# Patient Record
Sex: Male | Born: 1965 | Race: Asian | Hispanic: No | Marital: Married | Smoking: Never smoker
Health system: Southern US, Community
[De-identification: ages and names within clinical notes are randomized; demographics above are authoritative.]

## PROBLEM LIST (undated history)

## (undated) DIAGNOSIS — A419 Sepsis, unspecified organism: Secondary | ICD-10-CM

## (undated) DIAGNOSIS — E119 Type 2 diabetes mellitus without complications: Secondary | ICD-10-CM

## (undated) DIAGNOSIS — R945 Abnormal results of liver function studies: Secondary | ICD-10-CM

## (undated) DIAGNOSIS — J90 Pleural effusion, not elsewhere classified: Secondary | ICD-10-CM

## (undated) DIAGNOSIS — K819 Cholecystitis, unspecified: Secondary | ICD-10-CM

## (undated) DIAGNOSIS — K859 Acute pancreatitis without necrosis or infection, unspecified: Secondary | ICD-10-CM

## (undated) DIAGNOSIS — I10 Essential (primary) hypertension: Secondary | ICD-10-CM

## (undated) DIAGNOSIS — E049 Nontoxic goiter, unspecified: Secondary | ICD-10-CM

## (undated) DIAGNOSIS — Z87442 Personal history of urinary calculi: Secondary | ICD-10-CM

## (undated) DIAGNOSIS — E7800 Pure hypercholesterolemia, unspecified: Secondary | ICD-10-CM

## (undated) DIAGNOSIS — R6521 Severe sepsis with septic shock: Secondary | ICD-10-CM

## (undated) DIAGNOSIS — R7989 Other specified abnormal findings of blood chemistry: Secondary | ICD-10-CM

## (undated) DIAGNOSIS — B961 Klebsiella pneumoniae [K. pneumoniae] as the cause of diseases classified elsewhere: Secondary | ICD-10-CM

## (undated) DIAGNOSIS — J189 Pneumonia, unspecified organism: Secondary | ICD-10-CM

## (undated) DIAGNOSIS — R7881 Bacteremia: Secondary | ICD-10-CM

## (undated) HISTORY — DX: Pleural effusion, not elsewhere classified: J90

## (undated) HISTORY — DX: Other specified abnormal findings of blood chemistry: R79.89

## (undated) HISTORY — DX: Abnormal results of liver function studies: R94.5

## (undated) HISTORY — DX: Klebsiella pneumoniae (k. pneumoniae) as the cause of diseases classified elsewhere: B96.1

## (undated) HISTORY — DX: Severe sepsis with septic shock: R65.21

## (undated) HISTORY — DX: Bacteremia: R78.81

## (undated) HISTORY — DX: Sepsis, unspecified organism: A41.9

---

## 2010-10-03 DIAGNOSIS — I10 Essential (primary) hypertension: Secondary | ICD-10-CM | POA: Insufficient documentation

## 2010-10-03 DIAGNOSIS — E119 Type 2 diabetes mellitus without complications: Secondary | ICD-10-CM | POA: Insufficient documentation

## 2013-04-24 DIAGNOSIS — K819 Cholecystitis, unspecified: Secondary | ICD-10-CM

## 2013-04-24 HISTORY — DX: Cholecystitis, unspecified: K81.9

## 2013-05-09 ENCOUNTER — Inpatient Hospital Stay (HOSPITAL_COMMUNITY)
Admission: EM | Admit: 2013-05-09 | Discharge: 2013-05-25 | DRG: 853 | Disposition: A | Discharge status: Home / Self Care | Payer: BC Managed Care – PPO | Attending: General Surgery | Admitting: General Surgery

## 2013-05-09 ENCOUNTER — Emergency Department (HOSPITAL_COMMUNITY): Payer: BC Managed Care – PPO

## 2013-05-09 ENCOUNTER — Encounter (HOSPITAL_COMMUNITY): Payer: Self-pay | Admitting: Emergency Medicine

## 2013-05-09 DIAGNOSIS — E119 Type 2 diabetes mellitus without complications: Secondary | ICD-10-CM | POA: Diagnosis present

## 2013-05-09 DIAGNOSIS — E43 Unspecified severe protein-calorie malnutrition: Secondary | ICD-10-CM | POA: Diagnosis present

## 2013-05-09 DIAGNOSIS — A419 Sepsis, unspecified organism: Principal | ICD-10-CM | POA: Diagnosis present

## 2013-05-09 DIAGNOSIS — K7689 Other specified diseases of liver: Secondary | ICD-10-CM | POA: Diagnosis present

## 2013-05-09 DIAGNOSIS — N179 Acute kidney failure, unspecified: Secondary | ICD-10-CM | POA: Diagnosis present

## 2013-05-09 DIAGNOSIS — K219 Gastro-esophageal reflux disease without esophagitis: Secondary | ICD-10-CM | POA: Diagnosis present

## 2013-05-09 DIAGNOSIS — I498 Other specified cardiac arrhythmias: Secondary | ICD-10-CM | POA: Diagnosis present

## 2013-05-09 DIAGNOSIS — J9600 Acute respiratory failure, unspecified whether with hypoxia or hypercapnia: Secondary | ICD-10-CM | POA: Diagnosis not present

## 2013-05-09 DIAGNOSIS — R7989 Other specified abnormal findings of blood chemistry: Secondary | ICD-10-CM

## 2013-05-09 DIAGNOSIS — D696 Thrombocytopenia, unspecified: Secondary | ICD-10-CM

## 2013-05-09 DIAGNOSIS — K8070 Calculus of gallbladder and bile duct without cholecystitis without obstruction: Secondary | ICD-10-CM | POA: Diagnosis present

## 2013-05-09 DIAGNOSIS — R000 Tachycardia, unspecified: Secondary | ICD-10-CM

## 2013-05-09 DIAGNOSIS — E876 Hypokalemia: Secondary | ICD-10-CM | POA: Diagnosis not present

## 2013-05-09 DIAGNOSIS — I10 Essential (primary) hypertension: Secondary | ICD-10-CM | POA: Diagnosis present

## 2013-05-09 DIAGNOSIS — R066 Hiccough: Secondary | ICD-10-CM | POA: Diagnosis present

## 2013-05-09 DIAGNOSIS — E870 Hyperosmolality and hypernatremia: Secondary | ICD-10-CM | POA: Diagnosis not present

## 2013-05-09 DIAGNOSIS — K859 Acute pancreatitis without necrosis or infection, unspecified: Secondary | ICD-10-CM

## 2013-05-09 DIAGNOSIS — Y849 Medical procedure, unspecified as the cause of abnormal reaction of the patient, or of later complication, without mention of misadventure at the time of the procedure: Secondary | ICD-10-CM | POA: Diagnosis not present

## 2013-05-09 DIAGNOSIS — E875 Hyperkalemia: Secondary | ICD-10-CM | POA: Diagnosis present

## 2013-05-09 DIAGNOSIS — K560 Paralytic ileus: Secondary | ICD-10-CM | POA: Diagnosis not present

## 2013-05-09 DIAGNOSIS — K8060 Calculus of gallbladder and bile duct with cholecystitis, unspecified, without obstruction: Secondary | ICD-10-CM | POA: Diagnosis present

## 2013-05-09 DIAGNOSIS — E785 Hyperlipidemia, unspecified: Secondary | ICD-10-CM | POA: Diagnosis present

## 2013-05-09 DIAGNOSIS — K8050 Calculus of bile duct without cholangitis or cholecystitis without obstruction: Secondary | ICD-10-CM

## 2013-05-09 DIAGNOSIS — Q619 Cystic kidney disease, unspecified: Secondary | ICD-10-CM

## 2013-05-09 DIAGNOSIS — N281 Cyst of kidney, acquired: Secondary | ICD-10-CM

## 2013-05-09 DIAGNOSIS — E049 Nontoxic goiter, unspecified: Secondary | ICD-10-CM | POA: Diagnosis present

## 2013-05-09 DIAGNOSIS — R109 Unspecified abdominal pain: Secondary | ICD-10-CM

## 2013-05-09 DIAGNOSIS — K929 Disease of digestive system, unspecified: Secondary | ICD-10-CM | POA: Diagnosis not present

## 2013-05-09 DIAGNOSIS — K8064 Calculus of gallbladder and bile duct with chronic cholecystitis without obstruction: Secondary | ICD-10-CM | POA: Diagnosis present

## 2013-05-09 DIAGNOSIS — D72829 Elevated white blood cell count, unspecified: Secondary | ICD-10-CM

## 2013-05-09 HISTORY — DX: Essential (primary) hypertension: I10

## 2013-05-09 HISTORY — DX: Cholecystitis, unspecified: K81.9

## 2013-05-09 HISTORY — DX: Type 2 diabetes mellitus without complications: E11.9

## 2013-05-09 HISTORY — DX: Acute pancreatitis without necrosis or infection, unspecified: K85.90

## 2013-05-09 HISTORY — DX: Nontoxic goiter, unspecified: E04.9

## 2013-05-09 LAB — URINALYSIS, ROUTINE W REFLEX MICROSCOPIC
Glucose, UA: NEGATIVE mg/dL
Ketones, ur: 15 mg/dL — AB
Nitrite: NEGATIVE
Protein, ur: NEGATIVE mg/dL
Urobilinogen, UA: 0.2 mg/dL (ref 0.0–1.0)

## 2013-05-09 LAB — GLUCOSE, CAPILLARY
Glucose-Capillary: 182 mg/dL — ABNORMAL HIGH (ref 70–99)
Glucose-Capillary: 201 mg/dL — ABNORMAL HIGH (ref 70–99)
Glucose-Capillary: 219 mg/dL — ABNORMAL HIGH (ref 70–99)

## 2013-05-09 LAB — CBC WITH DIFFERENTIAL/PLATELET
Basophils Absolute: 0 10*3/uL (ref 0.0–0.1)
Eosinophils Absolute: 0.3 10*3/uL (ref 0.0–0.7)
Eosinophils Relative: 1 % (ref 0–5)
HCT: 49.4 % (ref 39.0–52.0)
Lymphs Abs: 7.1 10*3/uL — ABNORMAL HIGH (ref 0.7–4.0)
MCH: 32.5 pg (ref 26.0–34.0)
MCV: 91.8 fL (ref 78.0–100.0)
Monocytes Absolute: 2 10*3/uL — ABNORMAL HIGH (ref 0.1–1.0)
Neutro Abs: 24.6 10*3/uL — ABNORMAL HIGH (ref 1.7–7.7)
Platelets: 239 10*3/uL (ref 150–400)
RBC: 5.38 MIL/uL (ref 4.22–5.81)
RDW: 13.2 % (ref 11.5–15.5)

## 2013-05-09 LAB — URINE MICROSCOPIC-ADD ON

## 2013-05-09 LAB — COMPREHENSIVE METABOLIC PANEL
AST: 159 U/L — ABNORMAL HIGH (ref 0–37)
BUN: 14 mg/dL (ref 6–23)
CO2: 24 mEq/L (ref 19–32)
Calcium: 9.6 mg/dL (ref 8.4–10.5)
Chloride: 96 mEq/L (ref 96–112)
Creatinine, Ser: 1.38 mg/dL — ABNORMAL HIGH (ref 0.50–1.35)
GFR calc non Af Amer: 60 mL/min — ABNORMAL LOW (ref >90)
Total Bilirubin: 6.6 mg/dL — ABNORMAL HIGH (ref 0.3–1.2)
Total Protein: 8 g/dL (ref 6.0–8.3)

## 2013-05-09 LAB — MAGNESIUM: Magnesium: 1.8 mg/dL (ref 1.5–2.5)

## 2013-05-09 LAB — LIPASE, BLOOD: Lipase: >3000 U/L — ABNORMAL HIGH (ref 11–59)

## 2013-05-09 MED ORDER — SODIUM CHLORIDE 0.9 % IV SOLN
3.0000 g | Freq: Once | INTRAVENOUS | Status: AC
  Administered 2013-05-09: 3 g via INTRAVENOUS
  Filled 2013-05-09: qty 3

## 2013-05-09 MED ORDER — HYDROMORPHONE HCL PF 1 MG/ML IJ SOLN
INTRAMUSCULAR | Status: AC
  Administered 2013-05-09: 2 mg via INTRAVENOUS
  Filled 2013-05-09: qty 2

## 2013-05-09 MED ORDER — IOHEXOL 300 MG/ML  SOLN: 25.0000 mL | INTRAMUSCULAR | Status: DC

## 2013-05-09 MED ORDER — HYDROMORPHONE HCL PF 1 MG/ML IJ SOLN
1.0000 mg | Freq: Once | INTRAMUSCULAR | Status: AC
  Administered 2013-05-09: 1 mg via INTRAVENOUS
  Filled 2013-05-09: qty 1

## 2013-05-09 MED ORDER — ONDANSETRON HCL 4 MG/2ML IJ SOLN
4.0000 mg | Freq: Three times a day (TID) | INTRAMUSCULAR | Status: AC | PRN
  Administered 2013-05-09 – 2013-05-13 (×2): 4 mg via INTRAVENOUS
  Filled 2013-05-09: qty 2

## 2013-05-09 MED ORDER — HYDROMORPHONE HCL PF 1 MG/ML IJ SOLN
1.0000 mg | INTRAMUSCULAR | Status: DC | PRN
  Administered 2013-05-09 – 2013-05-10 (×7): 2 mg via INTRAVENOUS
  Filled 2013-05-09 (×6): qty 2

## 2013-05-09 MED ORDER — ONDANSETRON HCL 4 MG/2ML IJ SOLN
4.0000 mg | Freq: Once | INTRAMUSCULAR | Status: AC
  Administered 2013-05-09: 4 mg via INTRAVENOUS
  Filled 2013-05-09: qty 2

## 2013-05-09 MED ORDER — IOHEXOL 300 MG/ML  SOLN
80.0000 mL | Freq: Once | INTRAMUSCULAR | Status: AC | PRN
  Administered 2013-05-09: 80 mL via INTRAVENOUS

## 2013-05-09 MED ORDER — SODIUM CHLORIDE 0.9 % IV SOLN
3.0000 g | Freq: Four times a day (QID) | INTRAVENOUS | Status: DC
  Administered 2013-05-09 – 2013-05-13 (×15): 3 g via INTRAVENOUS
  Filled 2013-05-09 (×19): qty 3

## 2013-05-09 MED ORDER — ONDANSETRON HCL 4 MG/2ML IJ SOLN
INTRAMUSCULAR | Status: AC
  Administered 2013-05-09: 4 mg via INTRAVENOUS
  Filled 2013-05-09: qty 2

## 2013-05-09 MED ORDER — INSULIN ASPART 100 UNIT/ML ~~LOC~~ SOLN
0.0000 [IU] | SUBCUTANEOUS | Status: DC
Start: 2013-05-09 — End: 2013-05-20
  Administered 2013-05-09 (×2): 5 [IU] via SUBCUTANEOUS
  Administered 2013-05-10: 2 [IU] via SUBCUTANEOUS
  Administered 2013-05-10 (×2): 5 [IU] via SUBCUTANEOUS
  Administered 2013-05-11 – 2013-05-12 (×4): 2 [IU] via SUBCUTANEOUS
  Administered 2013-05-12: 5 [IU] via SUBCUTANEOUS
  Administered 2013-05-13: 3 [IU] via SUBCUTANEOUS
  Administered 2013-05-13: 2 [IU] via SUBCUTANEOUS
  Administered 2013-05-14: 1 [IU] via SUBCUTANEOUS
  Administered 2013-05-14: 3 [IU] via SUBCUTANEOUS
  Administered 2013-05-14: 2 [IU] via SUBCUTANEOUS
  Administered 2013-05-14: 3 [IU] via SUBCUTANEOUS
  Administered 2013-05-14: 1 [IU] via SUBCUTANEOUS
  Administered 2013-05-14 – 2013-05-15 (×2): 2 [IU] via SUBCUTANEOUS
  Administered 2013-05-15: 1 [IU] via SUBCUTANEOUS
  Administered 2013-05-15: 2 [IU] via SUBCUTANEOUS
  Administered 2013-05-16 (×4): 3 [IU] via SUBCUTANEOUS
  Administered 2013-05-16 (×2): 5 [IU] via SUBCUTANEOUS
  Administered 2013-05-17: 3 [IU] via SUBCUTANEOUS
  Administered 2013-05-17: 11 [IU] via SUBCUTANEOUS
  Administered 2013-05-17 – 2013-05-18 (×3): 5 [IU] via SUBCUTANEOUS
  Administered 2013-05-18: 8 [IU] via SUBCUTANEOUS
  Administered 2013-05-18: 5 [IU] via SUBCUTANEOUS
  Administered 2013-05-18: 8 [IU] via SUBCUTANEOUS
  Administered 2013-05-18 (×2): 5 [IU] via SUBCUTANEOUS
  Administered 2013-05-19: 11 [IU] via SUBCUTANEOUS
  Administered 2013-05-19 (×3): 5 [IU] via SUBCUTANEOUS
  Administered 2013-05-19 (×2): 8 [IU] via SUBCUTANEOUS
  Administered 2013-05-20: 3 [IU] via SUBCUTANEOUS
  Administered 2013-05-20 (×2): 5 [IU] via SUBCUTANEOUS

## 2013-05-09 MED ORDER — LORAZEPAM 2 MG/ML IJ SOLN
1.0000 mg | Freq: Once | INTRAMUSCULAR | Status: AC
  Administered 2013-05-09: 1 mg via INTRAVENOUS
  Filled 2013-05-09: qty 1

## 2013-05-09 MED ORDER — POTASSIUM CHLORIDE IN NACL 40-0.9 MEQ/L-% IV SOLN
INTRAVENOUS | Status: DC
  Administered 2013-05-09: 18:00:00 via INTRAVENOUS
  Administered 2013-05-10: 125 mL/h via INTRAVENOUS
  Administered 2013-05-10: 04:00:00 via INTRAVENOUS
  Filled 2013-05-09 (×8): qty 1000

## 2013-05-09 MED ORDER — SODIUM CHLORIDE 0.9 % IV BOLUS (SEPSIS)
1000.0000 mL | Freq: Once | INTRAVENOUS | Status: AC
  Administered 2013-05-09: 1000 mL via INTRAVENOUS

## 2013-05-09 NOTE — ED Provider Notes (Signed)
Pt reports he started having epigastric abdominal pain that does not radiate about 3 weeks ago. He has had decreased appetite but denies nausea, vomiting, fever or chills. He states his urine has started getting darker.   Pt has scleral icterus and mild yellow tint to his skin. He appears to be uncomfortable.   Medical screening examination/treatment/procedure(s) were conducted as a shared visit with non-physician practitioner(s) and myself.  I personally evaluated the patient during the encounter.  EKG Interpretation   None         Devoria Albe, MD, Armando Gang   Ward Givens, MD 05/09/13 [phone]

## 2013-05-09 NOTE — ED Notes (Signed)
Pt in CT.

## 2013-05-09 NOTE — ED Notes (Signed)
Arrives Via [facility] for abdominal pain x1 month, this morning pain got worse while driving. Pt describes pain as stabbing doesn't radiate, upon arrival pt was pale, diaphoretic, given 4mg  of zofran en route, 50 mcg of fentanyl x2 en route brought pain from 10/10 to 9/10. EKG NSR with hr of 62, CBG 220 with hx of diabetes.

## 2013-05-09 NOTE — Consult Note (Signed)
Reason for Consult: Choledocholithiasis, Cholelithiasis, and Pancreatitis Referring Physician: [facility]  Nicolas Chandler HPI: This is a 47 year old male with a PMH of DM and HTN admitted for choledocholithiasis, cholelithiasis, and pancreatitis.  His symptoms started 3 weeks ago and he thought it was as a result of GERD, but it was unresponsive to Zantac.  He subsequently had intermittent symptoms over the intervening time period, but today he started to have unremitting pain.  The patient presented to the ER and he was identified to have the above findings.  No prior issues with his gallbladder until this time.  As a result of the findings a GI consultation was requested.  Past Medical History  Diagnosis Date  . Diabetes mellitus without complication   . Hypertension     History reviewed. No pertinent past surgical history.  History reviewed. No pertinent family history.  Social History:  reports that he has never smoked. He does not have any smokeless tobacco history on file. He reports that he does not drink alcohol. His drug history is not on file.  Allergies: No Known Allergies  Medications:  Scheduled:  Continuous: . ampicillin-sulbactam (UNASYN) IV 3 g (05/09/13 1621)    Results for orders placed during the hospital encounter of 05/09/13 (from the past 24 hour(s))  CBC WITH DIFFERENTIAL     Status: Abnormal   Collection Time    05/09/13 12:32 PM      Result Value Range   WBC 34.0 (*) 4.0 - 10.5 K/uL   RBC 5.38  4.22 - 5.81 MIL/uL   Hemoglobin 17.5 (*) 13.0 - 17.0 g/dL   HCT 49.4  39.0 - 52.0 %   MCV 91.8  78.0 - 100.0 fL   MCH 32.5  26.0 - 34.0 pg   MCHC 35.4  30.0 - 36.0 g/dL   RDW 13.2  11.5 - 15.5 %   Platelets 239  150 - 400 K/uL   Neutrophils Relative % 72  43 - 77 %   Lymphocytes Relative 21  12 - 46 %   Monocytes Relative 6  3 - 12 %   Eosinophils Relative 1  0 - 5 %   Basophils Relative 0  0 - 1 %   Neutro Abs 24.6 (*) 1.7 - 7.7 K/uL   Lymphs Abs 7.1  (*) 0.7 - 4.0 K/uL   Monocytes Absolute 2.0 (*) 0.1 - 1.0 K/uL   Eosinophils Absolute 0.3  0.0 - 0.7 K/uL   Basophils Absolute 0.0  0.0 - 0.1 K/uL   WBC Morphology WHITE COUNT CONFIRMED ON SMEAR    COMPREHENSIVE METABOLIC PANEL     Status: Abnormal   Collection Time    05/09/13 12:32 PM      Result Value Range   Sodium 136  135 - 145 mEq/L   Potassium 2.9 (*) 3.5 - 5.1 mEq/L   Chloride 96  96 - 112 mEq/L   CO2 24  19 - 32 mEq/L   Glucose, Bld 201 (*) 70 - 99 mg/dL   BUN 14  6 - 23 mg/dL   Creatinine, Ser 1.38 (*) 0.50 - 1.35 mg/dL   Calcium 9.6  8.4 - 10.5 mg/dL   Total Protein 8.0  6.0 - 8.3 g/dL   Albumin 4.6  3.5 - 5.2 g/dL   AST 159 (*) 0 - 37 U/L   ALT 464 (*) 0 - 53 U/L   Alkaline Phosphatase 239 (*) 39 - 117 U/L   Total Bilirubin 6.6 (*) 0.3 -   1.2 mg/dL   GFR calc non Af Amer 60 (*) >90 mL/min   GFR calc Af Amer 70 (*) >90 mL/min  LIPASE, BLOOD     Status: Abnormal   Collection Time    05/09/13 12:32 PM      Result Value Range   Lipase >3000 (*) 11 - 59 U/L  GLUCOSE, CAPILLARY     Status: Abnormal   Collection Time    05/09/13 12:44 PM      Result Value Range   Glucose-Capillary 182 (*) 70 - 99 mg/dL   Comment 1 Documented in Chart    URINALYSIS, ROUTINE W REFLEX MICROSCOPIC     Status: Abnormal   Collection Time    05/09/13  1:47 PM      Result Value Range   Color, Urine ORANGE (*) YELLOW   APPearance CLEAR  CLEAR   Specific Gravity, Urine 1.016  1.005 - 1.030   pH 5.0  5.0 - 8.0   Glucose, UA NEGATIVE  NEGATIVE mg/dL   Hgb urine dipstick NEGATIVE  NEGATIVE   Bilirubin Urine LARGE (*) NEGATIVE   Ketones, ur 15 (*) NEGATIVE mg/dL   Protein, ur NEGATIVE  NEGATIVE mg/dL   Urobilinogen, UA 0.2  0.0 - 1.0 mg/dL   Nitrite NEGATIVE  NEGATIVE   Leukocytes, UA TRACE (*) NEGATIVE  URINE MICROSCOPIC-ADD ON     Status: Abnormal   Collection Time    05/09/13  1:47 PM      Result Value Range   Squamous Epithelial / LPF RARE  RARE   WBC, UA 0-2  <3 WBC/hpf   RBC /  HPF 0-2  <3 RBC/hpf   Bacteria, UA RARE  RARE   Casts HYALINE CASTS (*) NEGATIVE     Us Abdomen Complete  05/09/2013   CLINICAL DATA:  Right upper quadrant pain.  EXAM: ULTRASOUND ABDOMEN COMPLETE  COMPARISON:  None.  FINDINGS: Gallbladder:  There are multiple echogenic stones in the gallbladder. Gallbladder wall is mildly thickened, measuring 0.3 cm. There is hypoechoic material in the right upper quadrant suggestive for complex fluid. The complex fluid extends into the right flank region. There is simple appearing fluid around the gallbladder. Reportedly, the patient does not have a sonographic Murphy's sign but the patient has received pain medication.  Common bile duct:  Diameter: 0.5 cm  Liver:  Increased echogenicity of the liver suggests hepatic steatosis. The liver parenchyma is also heterogeneous with loss of the internal architecture.  IVC:  Not well seen.  Pancreas:  Not well seen.  Spleen:  Size and appearance within normal limits. Spleen measures 6.4 cm in length.  Right Kidney:  Length: 10.5 cm. Echogenicity within normal limits. No mass or hydronephrosis visualized.  Left Kidney:  Length: 10.8 cm. Echogenicity within normal limits. No mass or hydronephrosis visualized.  Abdominal aorta:  No aneurysm visualized.  Other findings:  None.  IMPRESSION: Cholelithiasis with mild gallbladder wall thickening. There is complex fluid in the right upper quadrant suggesting an acute inflammatory process.  Hepatic steatosis.   Electronically Signed   By: Adam  Henn M.D.   On: 05/09/2013 15:25   Ct Abdomen Pelvis W Contrast  05/09/2013   CLINICAL DATA:  Abdominal pain.  EXAM: CT ABDOMEN AND PELVIS WITH CONTRAST  TECHNIQUE: Multidetector CT imaging of the abdomen and pelvis was performed using the standard protocol following bolus administration of intravenous contrast.  CONTRAST:  80mL OMNIPAQUE IOHEXOL 300 MG/ML  SOLN  COMPARISON:  Ultrasound 05/09/2013  FINDINGS: Lung bases   are clear.  There is no  evidence for free air.  Decreased attenuation of the liver is consistent with hepatic steatosis. There is a small amount of fluid along the right hepatic dome. There is fluid around the inferior gallbladder and the duodenum. There are multiple gallstones. The gallbladder wall is dense and could represent hyperemia. Portal venous system is patent. There is marked inflammation involving the descending duodenum. There is wall thickening along the medial duodenum wall, measuring up to 1.6 cm. Multiple calcifications in the distal common bile duct are suggestive for choledocholithiasis. There is no significant common bile duct dilatation. The base of the gallbladder or the cystic duct is distended, measuring up to 1.5 cm, and containing multiple stones. There is fluid or edema in the porta hepatitis. There is fluid and edema tracking around the pancreas. Fluid extends into the left upper quadrant and adjacent to the stomach and spleen. No significant pancreatic duct dilatation. Normal appearance of the spleen.  There is a 1.7 cm low-density structure along the right kidney upper pole. The Hounsfield units roughly measure 39 and not consistent with a simple cyst. However, the Hounsfield units do not significantly change on the delayed images and probably represents a proteinaceous or hemorrhagic cyst. There is a small cyst along the medial right kidney. Question a punctate stone in the right kidney without hydronephrosis. There are two nonobstructive left kidney stones.  No gross abnormality to the prostate, seminal vesicles or urinary bladder. No significant lymphadenopathy. No acute inflammation involving the appendix. Normal appearance of the jejunum and ileum. No gross abnormality to the colon.  Bilateral pars defects at L5. No significant anterolisthesis at L5-S1.  IMPRESSION: There is extensive fluid and inflammation in the right upper quadrant, centered around the duodenum. There is marked asymmetric duodenal wall  thickening with stones in the distal common bile duct. Evidence for pancreatitis.  Multiple gallstones and cannot exclude cholecystitis.  Decreased attenuation of the liver suggests hepatic steatosis.  Nonobstructive kidney stones.  1.7 cm indeterminate right renal structure. This probably represents a hyperdense or proteinaceous renal cyst but indeterminate. Consider follow-up ultrasound after the acute process has resolved. This area was not well evaluated on today's ultrasound.  These results were called by telephone at the time of interpretation on 05/09/2013 at 3:43 PM to Dr. KAITLYN SZEKALSKI , who verbally acknowledged these results.   Electronically Signed   By: Adam  Henn M.D.   On: 05/09/2013 15:47    ROS:  As stated above in the HPI otherwise negative.  Blood pressure 132/74, pulse 74, temperature 98.2 F (36.8 C), temperature source Oral, resp. rate 31, SpO2 98.00%.    PE: Gen: NAD, Alert and Oriented, uncomfortable HEENT:  [place]/AT, EOMI Neck: Supple, no LAD Lungs: CTA Bilaterally CV: RRR without M/G/R ABM: Soft, minimal epigastric tenderness, +BS Ext: No C/C/E Skin: Jaundiced  Assessment/Plan: 1) Choledocholithiasis. 2) Cholelithiasis. 3) Gallstone pancreatitis.   The patient has choledocholithiasis, but there is no ductal dilation.  There is a significant amount of inflammation around the duodenum.  I cannot discern if this is from his pancreatitis alone or if there is a component of cholecystitis.  His WBC is elevated, but he does not appear to be septic.  His WBC elevation can be from his pancreatitis.  Plan: 1) ERCP tomorrow. 2) Pain control. 3) Surgical consultation. 4) NPO. 5) Continue with antibiotics.  Zariel Capano D 05/09/2013, 4:52 PM      

## 2013-05-09 NOTE — Progress Notes (Signed)
ANTIBIOTIC CONSULT NOTE - INITIAL  Pharmacy Consult for Unasyn Indication: Cholecystitis   No Known Allergies  Patient Measurements:   Adjusted Body Weight: n/a  Vital Signs: Temp: 98.2 F (36.8 C) (12/16 1234) Temp src: Oral (12/16 1234) BP: 132/74 mmHg (12/16 1600) Pulse Rate: 74 (12/16 1600) Intake/Output from previous day:   Intake/Output from this shift: Total I/O In: 1000 [I.V.:1000] Out: -   Labs:  Recent Labs  05/09/13 1232  WBC 34.0*  HGB 17.5*  PLT 239  CREATININE 1.38*   CrCl is unknown because there is no height on file for the current visit. No results found for this basename: VANCOTROUGH, VANCOPEAK, VANCORANDOM, GENTTROUGH, GENTPEAK, GENTRANDOM, TOBRATROUGH, TOBRAPEAK, TOBRARND, AMIKACINPEAK, AMIKACINTROU, AMIKACIN,  in the last 72 hours   Microbiology: No results found for this or any previous visit (from the past 720 hour(s)).  Medical History: Past Medical History  Diagnosis Date  . Diabetes mellitus without complication   . Hypertension     Medications:   (Not in a hospital admission) Assessment: 43 YOM who presents with abdominal pain for the past 3 weeks that acutely worsened this morning. His RUQ US shows gallstones and mild gallbladder thickening. CT abdomen pending. Starting unasyin for cholecystitis.   Goal of Therapy:  Eradication of infection  Urine Cx>>  Plan:  1) Unasyn 3 gm IV x 1 dose in ED 2) Unasyn maintenance dose of 3 gm IV Q 6 hours after patient admitted to bed 3) F/u CBC, cultures and patient clinical status   Vinnie Level, PharmD.  Clinical Pharmacist Pager [phone]

## 2013-05-09 NOTE — H&P (Addendum)
[facility] History and Physical  Nicolas Chandler WJX:[id number] DOB: Apr 20, 1966 DOA: 05/09/2013  Referring physician: er PCP: No PCP Per Patient - PCP in [place]  Chief Complaint: abd pain  HPI: Nicolas Chandler is a 47 y.o. male  With PMHx of DM, HTN, HLD but no heart issues who comes in with a 3 week history of abdominal pain.  Worsened yesterday With nausea and vomiting.  The pain is severe- never had before, nothing better, nothing worse. No fever  No chills No CP, no SOB  In the Er, he was found to have an elevated lipase, WBC and and was found to have choledocholithiasis- GI ([facility]) was called for an evaluation and hospitalist were called for admission   Review of Systems:  All systems reviewed, negative unless stated above   Past Medical History  Diagnosis Date  . Diabetes mellitus without complication   . Hypertension    History reviewed. No pertinent past surgical history. Social History:  reports that he has never smoked. He does not have any smokeless tobacco history on file. He reports that he does not drink alcohol. His drug history is not on file.  No Known Allergies  History reviewed. No pertinent family history.   Prior to Admission medications   Medication Sig Start Date End Date Taking? Authorizing Provider  glimepiride (AMARYL) 4 MG tablet Take 4 mg by mouth 2 (two) times daily.   Yes Historical Provider, MD  lisinopril (PRINIVIL,ZESTRIL) 5 MG tablet Take 5 mg by mouth daily.   Yes Historical Provider, MD  metFORMIN (GLUCOPHAGE) 500 MG tablet Take 500 mg by mouth 2 (two) times daily with a meal.   Yes Historical Provider, MD  OVER THE COUNTER MEDICATION Take 1 tablet by mouth once.   Yes Historical Provider, MD  simvastatin (ZOCOR) 20 MG tablet Take 20 mg by mouth daily.   Yes Historical Provider, MD   Physical Exam: Filed Vitals:   05/09/13 1600  BP: 132/74  Pulse: 74  Temp:   Resp:     BP 132/74  Pulse 74  Temp(Src) 98.2 F (36.8 C) (Oral)  Resp  31  Ht 5\' 6"  (1.676 m)  Wt 70 kg (154 lb 5.2 oz)  BMI 24.92 kg/m2  SpO2 98%  BP 132/74  Pulse 74  Temp(Src) 98.2 F (36.8 C) (Oral)  Resp 31  Ht 5\' 6"  (1.676 m)  Wt 70 kg (154 lb 5.2 oz)  BMI 24.92 kg/m2  SpO2 98%  General Appearance:    Uncomfortable, mild skin yellowing  Head:    Normocephalic, without obvious abnormality, atraumatic  Eyes:    PERRL, conjunctiva/corneas clear, EOM's intact, fundi    benign, both eyes       Ears:    Normal TM's and external ear canals, both ears  Nose:   Nares normal, septum midline, mucosa normal, no drainage   or sinus tenderness  Throat:   Dry mucous membranes  Neck:   Supple, symmetrical, trachea midline, no adenopathy;       thyroid:  No enlargement/tenderness/nodules; no carotid   bruit or JVD  Back:     Symmetric, no curvature, ROM normal, no CVA tenderness  Lungs:     Clear to auscultation bilaterally, respirations unlabored  Chest wall:    No tenderness or deformity  Heart:    Regular rate and rhythm, S1 and S2 normal, no murmur, rub   or gallop  Abdomen:     Soft, tender across the upper abd, bowel sounds active  all four quadrants    Extremities:   Extremities normal, atraumatic, no cyanosis or edema  Pulses:   2+ and symmetric all extremities  Skin:   Skin color, texture, turgor normal, no rashes or lesions  Lymph nodes:   Cervical, supraclavicular, and axillary nodes normal  Neurologic:   CNII-XII intact. Normal strength, sensation and reflexes      throughout             Labs on Admission:  Basic Metabolic Panel:  Recent Labs Lab 05/09/13 1232  NA 136  K 2.9*  CL 96  CO2 24  GLUCOSE 201*  BUN 14  CREATININE 1.38*  CALCIUM 9.6   Liver Function Tests:  Recent Labs Lab 05/09/13 1232  AST 159*  ALT 464*  ALKPHOS 239*  BILITOT 6.6*  PROT 8.0  ALBUMIN 4.6    Recent Labs Lab 05/09/13 1232  LIPASE >3000*   No results found for this basename: AMMONIA,  in the last 168 hours CBC:  Recent Labs Lab  05/09/13 1232  WBC 34.0*  NEUTROABS 24.6*  HGB 17.5*  HCT 49.4  MCV 91.8  PLT 239   Cardiac Enzymes: No results found for this basename: CKTOTAL, CKMB, CKMBINDEX, TROPONINI,  in the last 168 hours  BNP (last 3 results) No results found for this basename: PROBNP,  in the last 8760 hours CBG:  Recent Labs Lab 05/09/13 1244  GLUCAP 182*    Radiological Exams on Admission: US Abdomen Complete  05/09/2013   CLINICAL DATA:  Right upper quadrant pain.  EXAM: ULTRASOUND ABDOMEN COMPLETE  COMPARISON:  None.  FINDINGS: Gallbladder:  There are multiple echogenic stones in the gallbladder. Gallbladder wall is mildly thickened, measuring 0.3 cm. There is hypoechoic material in the right upper quadrant suggestive for complex fluid. The complex fluid extends into the right flank region. There is simple appearing fluid around the gallbladder. Reportedly, the patient does not have a sonographic Murphy's sign but the patient has received pain medication.  Common bile duct:  Diameter: 0.5 cm  Liver:  Increased echogenicity of the liver suggests hepatic steatosis. The liver parenchyma is also heterogeneous with loss of the internal architecture.  IVC:  Not well seen.  Pancreas:  Not well seen.  Spleen:  Size and appearance within normal limits. Spleen measures 6.4 cm in length.  Right Kidney:  Length: 10.5 cm. Echogenicity within normal limits. No mass or hydronephrosis visualized.  Left Kidney:  Length: 10.8 cm. Echogenicity within normal limits. No mass or hydronephrosis visualized.  Abdominal aorta:  No aneurysm visualized.  Other findings:  None.  IMPRESSION: Cholelithiasis with mild gallbladder wall thickening. There is complex fluid in the right upper quadrant suggesting an acute inflammatory process.  Hepatic steatosis.   Electronically Signed   By: Richarda Overlie M.D.   On: 05/09/2013 15:25   Ct Abdomen Pelvis W Contrast  05/09/2013   CLINICAL DATA:  Abdominal pain.  EXAM: CT ABDOMEN AND PELVIS WITH  CONTRAST  TECHNIQUE: Multidetector CT imaging of the abdomen and pelvis was performed using the standard protocol following bolus administration of intravenous contrast.  CONTRAST:  80mL OMNIPAQUE IOHEXOL 300 MG/ML  SOLN  COMPARISON:  Ultrasound 05/09/2013  FINDINGS: Lung bases are clear.  There is no evidence for free air.  Decreased attenuation of the liver is consistent with hepatic steatosis. There is a small amount of fluid along the right hepatic dome. There is fluid around the inferior gallbladder and the duodenum. There are multiple gallstones. The gallbladder wall  is dense and could represent hyperemia. Portal venous system is patent. There is marked inflammation involving the descending duodenum. There is wall thickening along the medial duodenum wall, measuring up to 1.6 cm. Multiple calcifications in the distal common bile duct are suggestive for choledocholithiasis. There is no significant common bile duct dilatation. The base of the gallbladder or the cystic duct is distended, measuring up to 1.5 cm, and containing multiple stones. There is fluid or edema in the porta hepatitis. There is fluid and edema tracking around the pancreas. Fluid extends into the left upper quadrant and adjacent to the stomach and spleen. No significant pancreatic duct dilatation. Normal appearance of the spleen.  There is a 1.7 cm low-density structure along the right kidney upper pole. The Hounsfield units roughly measure 39 and not consistent with a simple cyst. However, the Hounsfield units do not significantly change on the delayed images and probably represents a proteinaceous or hemorrhagic cyst. There is a small cyst along the medial right kidney. Question a punctate stone in the right kidney without hydronephrosis. There are two nonobstructive left kidney stones.  No gross abnormality to the prostate, seminal vesicles or urinary bladder. No significant lymphadenopathy. No acute inflammation involving the appendix.  Normal appearance of the jejunum and ileum. No gross abnormality to the colon.  Bilateral pars defects at L5. No significant anterolisthesis at L5-S1.  IMPRESSION: There is extensive fluid and inflammation in the right upper quadrant, centered around the duodenum. There is marked asymmetric duodenal wall thickening with stones in the distal common bile duct. Evidence for pancreatitis.  Multiple gallstones and cannot exclude cholecystitis.  Decreased attenuation of the liver suggests hepatic steatosis.  Nonobstructive kidney stones.  1.7 cm indeterminate right renal structure. This probably represents a hyperdense or proteinaceous renal cyst but indeterminate. Consider follow-up ultrasound after the acute process has resolved. This area was not well evaluated on today's ultrasound.  These results were called by telephone at the time of interpretation on 05/09/2013 at 3:43 PM to Dr. Emilia Beck , who verbally acknowledged these results.   Electronically Signed   By: Richarda Overlie M.D.   On: 05/09/2013 15:47      Assessment/Plan Active Problems:   Choledocholithiasis   Abdominal pain   Leukocytosis   Hypokalemia   AKI (acute kidney injury)   DM (diabetes mellitus)   HTN (hypertension)   HLD (hyperlipidemia)   Pancreatitis   Elevated LFTs   1. adb pain due to pancreatitis due to choledocholithiasis- IVF, NPO, await GI ([facility]) evaluations for possible ERCP, monitor labs and place on med surg with ABX coverage per pharmacy 2. Hypokalemia- replace in IV, check Mg, monitor 3. Leukocytosis- Unasyn per pharmacy 4. DM- hold PO meds, SSI- add lantus if needed while NPO 5. AKI- IVF, recheck in AM 6. HTN- hold PO meds, PRNs 7. HLD- hold PO meds  GI- Dr. Elnoria Howard  Code Status:full Family Communication: patient Disposition Plan: admit  Time spent: 75 min  Marlin Canary [facility] Pager [phone]

## 2013-05-09 NOTE — ED Provider Notes (Signed)
CSN: [id number]     Arrival date & time 05/09/13  1221 History   First MD Initiated Contact with Patient 05/09/13 1231     Chief Complaint  Patient presents with  . Abdominal Pain   (Consider location/radiation/quality/duration/timing/severity/associated sxs/prior Treatment) HPI Comments: Patient is a 47 year old male with a past medical history of diabetes and hypertension who presents with abdominal pain for the past 3 weeks that acutely worsened this morning. Symptoms started gradually and suddenly worsened this morning while he was driving. The pain is aching and severe without radiation. Patient reports associated nausea and vomiting. No aggravating/alleviating factors. No other associated symptoms. Patient has never had this previously. He denies drinking alcohol.   Patient is a 47 y.o. male presenting with abdominal pain.  Abdominal Pain Associated symptoms: nausea and vomiting   Associated symptoms: no chest pain, no chills, no diarrhea, no dysuria, no fatigue, no fever and no shortness of breath     Past Medical History  Diagnosis Date  . Diabetes mellitus without complication   . Hypertension    History reviewed. No pertinent past surgical history. History reviewed. No pertinent family history. History  Substance Use Topics  . Smoking status: Never Smoker   . Smokeless tobacco: Not on file  . Alcohol Use: No    Review of Systems  Constitutional: Negative for fever, chills and fatigue.  HENT: Negative for trouble swallowing.   Eyes: Negative for visual disturbance.  Respiratory: Negative for shortness of breath.   Cardiovascular: Negative for chest pain and palpitations.  Gastrointestinal: Positive for nausea, vomiting and abdominal pain. Negative for diarrhea.  Genitourinary: Negative for dysuria and difficulty urinating.  Musculoskeletal: Negative for arthralgias and neck pain.  Skin: Negative for color change.  Neurological: Negative for dizziness and weakness.   Psychiatric/Behavioral: Negative for dysphoric mood.    Allergies  Review of patient's allergies indicates no known allergies.  Home Medications   Current Outpatient Rx  Name  Route  Sig  Dispense  Refill  . glimepiride (AMARYL) 4 MG tablet   Oral   Take 4 mg by mouth 2 (two) times daily.         Marland Kitchen lisinopril (PRINIVIL,ZESTRIL) 5 MG tablet   Oral   Take 5 mg by mouth daily.         . metFORMIN (GLUCOPHAGE) 500 MG tablet   Oral   Take 500 mg by mouth 2 (two) times daily with a meal.         . OVER THE COUNTER MEDICATION   Oral   Take 1 tablet by mouth once.         . simvastatin (ZOCOR) 20 MG tablet   Oral   Take 20 mg by mouth daily.          BP 182/102  Pulse 80  Temp(Src) 98.2 F (36.8 C) (Oral)  Resp 31  SpO2 98% Physical Exam  Nursing note and vitals reviewed. Constitutional: He is oriented to person, place, and time. He appears well-developed and well-nourished. No distress.  Patient is obviously uncomfortable.   HENT:  Head: Normocephalic and atraumatic.  Eyes: Conjunctivae and EOM are normal.  Neck: Normal range of motion.  Cardiovascular: Normal rate and regular rhythm.  Exam reveals no gallop and no friction rub.   No murmur heard. Pulmonary/Chest: Effort normal and breath sounds normal. He has no wheezes. He has no rales. He exhibits no tenderness.  Abdominal: Soft. He exhibits no distension. There is tenderness. There is no  rebound and no guarding.  Epigastric tenderness to light palpation. No RUQ tenderness or other focal tenderness to palpation. No peritoneal signs.   Musculoskeletal: Normal range of motion.  Neurological: He is alert and oriented to person, place, and time. Coordination normal.  Speech is goal-oriented. Moves limbs without ataxia.   Skin: Skin is warm and dry.  Patient is diffusely jaundiced.   Psychiatric: He has a normal mood and affect. His behavior is normal.    ED Course  Procedures (including critical care  time) Labs Review Labs Reviewed  CBC WITH DIFFERENTIAL - Abnormal; Notable for the following:    WBC 34.0 (*)    Hemoglobin 17.5 (*)    Neutro Abs 24.6 (*)    Lymphs Abs 7.1 (*)    Monocytes Absolute 2.0 (*)    All other components within normal limits  COMPREHENSIVE METABOLIC PANEL - Abnormal; Notable for the following:    Potassium 2.9 (*)    Glucose, Bld 201 (*)    Creatinine, Ser 1.38 (*)    AST 159 (*)    ALT 464 (*)    Alkaline Phosphatase 239 (*)    Total Bilirubin 6.6 (*)    GFR calc non Af Amer 60 (*)    GFR calc Af Amer 70 (*)    All other components within normal limits  LIPASE, BLOOD - Abnormal; Notable for the following:    Lipase >3000 (*)    All other components within normal limits  URINALYSIS, ROUTINE W REFLEX MICROSCOPIC - Abnormal; Notable for the following:    Color, Urine ORANGE (*)    Bilirubin Urine LARGE (*)    Ketones, ur 15 (*)    Leukocytes, UA TRACE (*)    All other components within normal limits  GLUCOSE, CAPILLARY - Abnormal; Notable for the following:    Glucose-Capillary 182 (*)    All other components within normal limits  URINE MICROSCOPIC-ADD ON - Abnormal; Notable for the following:    Casts HYALINE CASTS (*)    All other components within normal limits  URINE CULTURE   Imaging Review US Abdomen Complete  05/09/2013   CLINICAL DATA:  Right upper quadrant pain.  EXAM: ULTRASOUND ABDOMEN COMPLETE  COMPARISON:  None.  FINDINGS: Gallbladder:  There are multiple echogenic stones in the gallbladder. Gallbladder wall is mildly thickened, measuring 0.3 cm. There is hypoechoic material in the right upper quadrant suggestive for complex fluid. The complex fluid extends into the right flank region. There is simple appearing fluid around the gallbladder. Reportedly, the patient does not have a sonographic Murphy's sign but the patient has received pain medication.  Common bile duct:  Diameter: 0.5 cm  Liver:  Increased echogenicity of the liver  suggests hepatic steatosis. The liver parenchyma is also heterogeneous with loss of the internal architecture.  IVC:  Not well seen.  Pancreas:  Not well seen.  Spleen:  Size and appearance within normal limits. Spleen measures 6.4 cm in length.  Right Kidney:  Length: 10.5 cm. Echogenicity within normal limits. No mass or hydronephrosis visualized.  Left Kidney:  Length: 10.8 cm. Echogenicity within normal limits. No mass or hydronephrosis visualized.  Abdominal aorta:  No aneurysm visualized.  Other findings:  None.  IMPRESSION: Cholelithiasis with mild gallbladder wall thickening. There is complex fluid in the right upper quadrant suggesting an acute inflammatory process.  Hepatic steatosis.   Electronically Signed   By: Richarda Overlie M.D.   On: 05/09/2013 15:25    EKG Interpretation  None       MDM   1. Choledocholithiasis     3:32 PM Patient is jaundiced and having severe pain in his epigastric area. The labs are grossly unremarkable with a lipase of >3000 and elevated liver enzymes. Patient given dilaudid and zofran for symptoms. Patient's RUQ US shows gallstones with mild gallbladder wall thickening. Results of CT abdomen pelvis pending.   4:13 PM Patient has choledocholithiasis. Dr. Elnoria Howard will see the patient. Dr. Benjamine Mola will admit the patient. Patient will have Unasyn for possible infection. Patient is NPO.   Emilia Beck, PA-C 05/09/13 1616

## 2013-05-10 ENCOUNTER — Inpatient Hospital Stay (HOSPITAL_COMMUNITY): Payer: BC Managed Care – PPO

## 2013-05-10 ENCOUNTER — Encounter (HOSPITAL_COMMUNITY): Admission: EM | Disposition: A | Discharge status: Home / Self Care | Payer: Self-pay | Source: Home / Self Care

## 2013-05-10 ENCOUNTER — Encounter (HOSPITAL_COMMUNITY): Payer: Self-pay | Admitting: General Practice

## 2013-05-10 DIAGNOSIS — I498 Other specified cardiac arrhythmias: Secondary | ICD-10-CM

## 2013-05-10 DIAGNOSIS — I10 Essential (primary) hypertension: Secondary | ICD-10-CM

## 2013-05-10 DIAGNOSIS — K859 Acute pancreatitis without necrosis or infection, unspecified: Secondary | ICD-10-CM

## 2013-05-10 DIAGNOSIS — K8020 Calculus of gallbladder without cholecystitis without obstruction: Secondary | ICD-10-CM

## 2013-05-10 DIAGNOSIS — E785 Hyperlipidemia, unspecified: Secondary | ICD-10-CM

## 2013-05-10 HISTORY — PX: ERCP: SHX5425

## 2013-05-10 LAB — GLUCOSE, CAPILLARY
Glucose-Capillary: 207 mg/dL — ABNORMAL HIGH (ref 70–99)
Glucose-Capillary: 214 mg/dL — ABNORMAL HIGH (ref 70–99)
Glucose-Capillary: 156 mg/dL — ABNORMAL HIGH (ref 70–99)
Glucose-Capillary: 149 mg/dL — ABNORMAL HIGH (ref 70–99)
Glucose-Capillary: 124 mg/dL — ABNORMAL HIGH (ref 70–99)

## 2013-05-10 LAB — COMPREHENSIVE METABOLIC PANEL
ALT: 432 U/L — ABNORMAL HIGH (ref 0–53)
Albumin: 3.6 g/dL (ref 3.5–5.2)
Alkaline Phosphatase: 215 U/L — ABNORMAL HIGH (ref 39–117)
BUN: 21 mg/dL (ref 6–23)
CO2: 17 mEq/L — ABNORMAL LOW (ref 19–32)
Calcium: 8.3 mg/dL — ABNORMAL LOW (ref 8.4–10.5)
GFR calc Af Amer: 66 mL/min — ABNORMAL LOW (ref >90)
GFR calc non Af Amer: 57 mL/min — ABNORMAL LOW (ref >90)
Glucose, Bld: 201 mg/dL — ABNORMAL HIGH (ref 70–99)
Potassium: 5.6 mEq/L — ABNORMAL HIGH (ref 3.5–5.1)
Sodium: 135 mEq/L (ref 135–145)

## 2013-05-10 LAB — CBC
HCT: 52.6 % — ABNORMAL HIGH (ref 39.0–52.0)
MCH: 32.2 pg (ref 26.0–34.0)
MCHC: 35 g/dL (ref 30.0–36.0)
RBC: 5.72 MIL/uL (ref 4.22–5.81)
RDW: 13.5 % (ref 11.5–15.5)

## 2013-05-10 LAB — URINE CULTURE

## 2013-05-10 SURGERY — ERCP, WITH INTERVENTION IF INDICATED
Anesthesia: Moderate Sedation

## 2013-05-10 MED ORDER — FENTANYL CITRATE 0.05 MG/ML IJ SOLN
INTRAMUSCULAR | Status: AC
  Filled 2013-05-10: qty 2

## 2013-05-10 MED ORDER — ADENOSINE 6 MG/2ML IV SOLN
6.0000 mg | Freq: Once | INTRAVENOUS | Status: AC
  Administered 2013-05-10: 6 mg via INTRAVENOUS

## 2013-05-10 MED ORDER — MIDAZOLAM HCL 10 MG/2ML IJ SOLN
INTRAMUSCULAR | Status: DC | PRN
  Administered 2013-05-10 (×2): 2 mg via INTRAVENOUS

## 2013-05-10 MED ORDER — DIPHENHYDRAMINE HCL 50 MG/ML IJ SOLN
INTRAMUSCULAR | Status: AC
  Filled 2013-05-10: qty 1

## 2013-05-10 MED ORDER — ADENOSINE 12 MG/4ML IV SOLN
12.0000 mg | Freq: Once | INTRAVENOUS | Status: AC
  Administered 2013-05-10: 12 mg via INTRAVENOUS
  Filled 2013-05-10: qty 4

## 2013-05-10 MED ORDER — FENTANYL CITRATE 0.05 MG/ML IJ SOLN
INTRAMUSCULAR | Status: DC | PRN
  Administered 2013-05-10 (×2): 25 ug via INTRAVENOUS

## 2013-05-10 MED ORDER — BUTAMBEN-TETRACAINE-BENZOCAINE 2-2-14 % EX AERO
INHALATION_SPRAY | CUTANEOUS | Status: DC | PRN
  Administered 2013-05-10: 2 via TOPICAL

## 2013-05-10 MED ORDER — SODIUM CHLORIDE 0.9 % IV SOLN
INTRAVENOUS | Status: DC | PRN
  Administered 2013-05-10: 14:00:00

## 2013-05-10 MED ORDER — SODIUM CHLORIDE 0.9 % IV SOLN
INTRAVENOUS | Status: DC
  Administered 2013-05-10: 500 mL via INTRAVENOUS

## 2013-05-10 MED ORDER — METOPROLOL TARTRATE 1 MG/ML IV SOLN
INTRAVENOUS | Status: AC
  Administered 2013-05-10: 5 mg
  Filled 2013-05-10: qty 5

## 2013-05-10 MED ORDER — MIDAZOLAM HCL 5 MG/ML IJ SOLN
INTRAMUSCULAR | Status: AC
  Filled 2013-05-10: qty 2

## 2013-05-10 MED ORDER — METOPROLOL TARTRATE 1 MG/ML IV SOLN
5.0000 mg | Freq: Four times a day (QID) | INTRAVENOUS | Status: DC
  Administered 2013-05-10 – 2013-05-21 (×42): 5 mg via INTRAVENOUS
  Filled 2013-05-10 (×47): qty 5

## 2013-05-10 MED ORDER — ADENOSINE 6 MG/2ML IV SOLN
INTRAVENOUS | Status: AC
  Filled 2013-05-10: qty 2

## 2013-05-10 MED ORDER — ADENOSINE 6 MG/2ML IV SOLN
INTRAVENOUS | Status: AC
  Filled 2013-05-10: qty 4

## 2013-05-10 MED ORDER — HYDROMORPHONE HCL PF 1 MG/ML IJ SOLN
1.0000 mg | INTRAMUSCULAR | Status: DC | PRN
  Administered 2013-05-10: 1 mg via INTRAVENOUS
  Administered 2013-05-10 (×2): 2 mg via INTRAVENOUS
  Administered 2013-05-10: 1 mg via INTRAVENOUS
  Administered 2013-05-10 – 2013-05-11 (×2): 2 mg via INTRAVENOUS
  Administered 2013-05-11: 1 mg via INTRAVENOUS
  Administered 2013-05-11: 2 mg via INTRAVENOUS
  Administered 2013-05-11: 1 mg via INTRAVENOUS
  Administered 2013-05-11: 2 mg via INTRAVENOUS
  Administered 2013-05-11: 1 mg via INTRAVENOUS
  Administered 2013-05-11: 2 mg via INTRAVENOUS
  Administered 2013-05-11: 1 mg via INTRAVENOUS
  Administered 2013-05-11 – 2013-05-12 (×9): 2 mg via INTRAVENOUS
  Administered 2013-05-12 (×2): 1 mg via INTRAVENOUS
  Administered 2013-05-13: 2 mg via INTRAVENOUS
  Administered 2013-05-13: 1 mg via INTRAVENOUS
  Administered 2013-05-13 (×3): 2 mg via INTRAVENOUS
  Administered 2013-05-13: 1 mg via INTRAVENOUS
  Administered 2013-05-13 – 2013-05-16 (×26): 2 mg via INTRAVENOUS
  Administered 2013-05-17 (×2): 1 mg via INTRAVENOUS
  Administered 2013-05-17 (×2): 2 mg via INTRAVENOUS
  Administered 2013-05-18: 1 mg via INTRAVENOUS
  Administered 2013-05-18: 2 mg via INTRAVENOUS
  Administered 2013-05-18: 1 mg via INTRAVENOUS
  Administered 2013-05-18: 2 mg via INTRAVENOUS
  Administered 2013-05-18 (×2): 1 mg via INTRAVENOUS
  Administered 2013-05-19: 2 mg via INTRAVENOUS
  Administered 2013-05-19: 1 mg via INTRAVENOUS
  Filled 2013-05-10 (×3): qty 2
  Filled 2013-05-10: qty 1
  Filled 2013-05-10 (×2): qty 2
  Filled 2013-05-10: qty 1
  Filled 2013-05-10 (×3): qty 2
  Filled 2013-05-10 (×2): qty 1
  Filled 2013-05-10: qty 2
  Filled 2013-05-10: qty 1
  Filled 2013-05-10 (×4): qty 2
  Filled 2013-05-10: qty 1
  Filled 2013-05-10 (×12): qty 2
  Filled 2013-05-10 (×3): qty 1
  Filled 2013-05-10 (×7): qty 2
  Filled 2013-05-10: qty 1
  Filled 2013-05-10 (×2): qty 2
  Filled 2013-05-10: qty 1
  Filled 2013-05-10 (×6): qty 2
  Filled 2013-05-10: qty 1
  Filled 2013-05-10 (×8): qty 2
  Filled 2013-05-10: qty 1
  Filled 2013-05-10 (×4): qty 2
  Filled 2013-05-10: qty 1
  Filled 2013-05-10: qty 2
  Filled 2013-05-10: qty 1
  Filled 2013-05-10: qty 2
  Filled 2013-05-10: qty 1

## 2013-05-10 NOTE — Op Note (Signed)
[facility] [address] [place], [address]   ERCP PROCEDURE REPORT  PATIENT: Nicolas Chandler, Nicolas Chandler  MR# :[id number] BIRTHDATE: 1965-08-15  GENDER: Male ENDOSCOPIST: Jeani Hawking, MD REFERRED BY: PROCEDURE DATE:  05/10/2013 PROCEDURE:   ERCP with stent placement ASA CLASS:   Class III INDICATIONS:Choledocholithiasis. MEDICATIONS: Versed 4 mg IV and Fentanyl 50 mcg IV TOPICAL ANESTHETIC: Cetacaine Spray  DESCRIPTION OF PROCEDURE:   After the risks benefits and alternatives of the procedure were thoroughly explained, informed consent was obtained.  Prior to the start of the procedure the patient was noted to be in SVT.  His HR was between 150-163 with a hypertensive blood pressure.  He was asymptomatic, i.e., no complaints of chest pain or SOB.  Dr. Patty Sermons kindly assisted in his care and recommended adenosine.  He was administered 6 mg of IV adenosine, which did not break his SVT.  Tweleve mg of adenosine was then pushed with a transient break in his HR.  Since he was hemodynamically stable Dr. Patty Sermons felt that he could undergo the ERCP.  The patient spiked a fever at the start of his SVT.  It may be that he as cholangitis. The decision was made to proceed with the ERCP.  The [id number] ([id number])  endoscope was introduced through the mouth and advanced to the second portion of the duodenum . The ampulla was located in second portion of the duodenum, however, locating the ampulla was difficult.  There was significant amount of edema.  There was spontaneous drainage of bile and this help with the identification of the ampulla.  During the first attempt at cannulation, the sphincterotome was advanced and the guidewire was easily advanced.  However, I was not certain about the course of the guidewire.  Contrast injection opacified a nondilated CBD, but there was no opacification with the wire location.  The wire was withdrawn as well as the sphinctertome.   Repositioning allowed for the guidewire to follow up previously opacified CBD.  The guidewire was easily advanced and secured in the left intrahepatic ducts.  Contrast injection highlighted a nondilated CBD as well as the cystic duct.  No evidence of any CBD stones, but multiple fragments were draining from the ampulla.  Because of the severe edema, stone fragments, fever, and SVT, a 7 Fr x 5 cm stent was inserted without difficulty. I did not feel comfortable with creating a sphincterotomy as the area was so edematous.  Excellent drainage of contrast, bile and stone fragments were noted.  At this point the procedure was concluded..  The scope was then completely withdrawn from the patient and the procedure terminated.     COMPLICATIONS:  ENDOSCOPIC IMPRESSION: 1) Very minor choledocholithiasis. 2) Edematous second portion of the duodenum. 3) SVT and fever.  RECOMMENDATIONS: 1) Continue with antibiotics. 2) Continue with IV hydration. 3) Formal Cardiology consultation. 4) Transfer to telemetry bed.    _______________________________ eSigned:  Jeani Hawking, MD 05/10/2013 1:47 PM   CC:  PATIENT NAME:  Nicolas Chandler, Nicolas Chandler MR#: [id number]

## 2013-05-10 NOTE — Progress Notes (Signed)
Pt arrived in [facility] with a temperature of 101.5 Orally, B/P 152/112, HR 163 and SPO2 92% on Room Air. Dr. Elnoria Howard notified. Cardiology consulted. Per Dr. Patty Sermons, Adenosine IV ordered. Adenosine 6mg  IV push given at 1306, no change in HR. Adenosine 12mg  IV push given per Dr. Patty Sermons at 1309. Pt's heart rate continued to be in the 150's after second does of Adenosine. Per Dr. Patty Sermons, ok to proceed with procedure. Pt will be transferred to a monitored floor with a cardiology consult after the procedure. BRS, RN

## 2013-05-10 NOTE — Consult Note (Signed)
I saw him post-ERCP. HR down to 120's. Abdominal pain very similar to before. Only mild epigastric tenderness. Cardiology and GI findings noted. Will plan lap chole once more stable and pancreatitis resolves. Patient examined and I agree with the assessment and plan  Violeta Gelinas, MD, MPH, FACS Pager: [phone]  05/10/2013 4:15 PM

## 2013-05-10 NOTE — ED Provider Notes (Signed)
See prior note   Ward Givens, MD 05/10/13 [phone]

## 2013-05-10 NOTE — Consult Note (Addendum)
CONSULT NOTE  Date: 05/10/2013               Patient Name:  Nicolas Chandler MRN: [id number]  DOB: Sep 13, 1965 Age / Sex: 47 y.o., male        PCP: WIGAND-BOLLING,GWENDOLYN Primary Cardiologist: Luvenia Heller            Referring Physician: Elnoria Howard              Reason for Consult: tachycardia           History of Present Illness: Patient is a 47 y.o. male with a PMHx of HTN, hyperlipidemia, who was admitted to [facility] on 05/09/2013 for evaluation of abdominal pain.    Pt has no hx of heart disease.  He has had gall stones for over a year.  He was noted to have HR of 160 while in endo today.  Was given adenosine - no changes.   He is active, works as a Financial risk analyst.  No CP or dyspnea with vigorous activity.      Medications: Outpatient medications: Prescriptions prior to admission  Medication Sig Dispense Refill  . glimepiride (AMARYL) 4 MG tablet Take 4 mg by mouth 2 (two) times daily.      Marland Kitchen lisinopril (PRINIVIL,ZESTRIL) 5 MG tablet Take 5 mg by mouth daily.      . metFORMIN (GLUCOPHAGE) 500 MG tablet Take 500 mg by mouth 2 (two) times daily with a meal.      . OVER THE COUNTER MEDICATION Take 1 tablet by mouth once.      . simvastatin (ZOCOR) 20 MG tablet Take 20 mg by mouth daily.        Current medications: Current Facility-Administered Medications  Medication Dose Route Frequency Provider Last Rate Last Dose  . metoprolol (LOPRESSOR) 1 MG/ML injection           . 0.9 % NaCl with KCl 40 mEq / L  infusion   Intravenous Continuous Joseph Art, DO 125 mL/hr at 05/10/13 1520    . Ampicillin-Sulbactam (UNASYN) 3 g in sodium chloride 0.9 % 100 mL IVPB  3 g Intravenous Q6H Fayne Norrie, RPH   3 g at 05/10/13 1000  . HYDROmorphone (DILAUDID) injection 1-2 mg  1-2 mg Intravenous Q2H PRN Sherrie George, PA-C      . insulin aspart (novoLOG) injection 0-15 Units  0-15 Units Subcutaneous Q4H Joseph Art, DO   5 Units at 05/10/13 0444  . ondansetron (ZOFRAN) injection 4 mg  4 mg  Intravenous Q8H PRN Joseph Art, DO   4 mg at 05/09/13 1730     No Known Allergies   Past Medical History  Diagnosis Date  . Diabetes mellitus without complication   . Hypertension   . Cholecystitis 04/2013  . Fatty liver   . Pancreatitis     Past Surgical History  Procedure Laterality Date  . No past surgeries      History reviewed. No pertinent family history.  Social History:  reports that he has never smoked. He has never used smokeless tobacco. He reports that he does not drink alcohol or use illicit drugs.   Review of Systems: Constitutional:  denies fever, chills, diaphoresis, appetite change and fatigue.  HEENT: denies photophobia, eye pain, redness, hearing loss, ear pain, congestion, sore throat, rhinorrhea, sneezing, neck pain, neck stiffness and tinnitus.  Respiratory: denies SOB, DOE, cough, chest tightness, and wheezing.  Cardiovascular: denies chest pain, palpitations and leg swelling.  Gastrointestinal:  admits to   abdominal pain,   Genitourinary: denies dysuria, urgency, frequency, hematuria, flank pain and difficulty urinating.  Musculoskeletal: denies  myalgias, back pain, joint swelling, arthralgias and gait problem.   Skin: denies pallor, rash and wound.  Neurological: denies dizziness, seizures, syncope, weakness, light-headedness, numbness and headaches.   Hematological: denies adenopathy, easy bruising, personal or family bleeding history.  Psychiatric/ Behavioral: denies suicidal ideation, mood changes, confusion, nervousness, sleep disturbance and agitation.    Physical Exam: BP 130/96  Pulse 141  Temp(Src) 98.2 F (36.8 C) (Oral)  Resp 47  Ht 5\' 6"  (1.676 m)  Wt 157 lb 6.5 oz (71.4 kg)  BMI 25.42 kg/m2  SpO2 90%  General: Vital signs reviewed and noted. In mild distress  Head: Normocephalic, atraumatic, sclera anicteric, mucus membranes are moist  Neck: Supple. Negative for carotid bruits. JVD not elevated.  Lungs:  Clear bilaterally  to auscultation without wheezes, rales, or rhonchi. Breathing is unlabored.  Heart: RR with S1 S2. No murmurs,  tachy  Abdomen:  Mild tenderness, mildly distended.  Few BS   MSK: Strength and the appear normal for age.  Extremities: No clubbing or cyanosis. No edema.  Distal pedal pulses are 2+ and equal bilaterally.  Neurologic: Alert and oriented X 3. Moves all extremities spontaneously.  Psych: Responds to questions appropriately with a normal affect.    Lab results: Basic Metabolic Panel:  Recent Labs Lab 05/09/13 1232 05/09/13 1908 05/10/13 0453  NA 136  --  135  K 2.9*  --  5.6*  CL 96  --  104  CO2 24  --  17*  GLUCOSE 201*  --  201*  BUN 14  --  21  CREATININE 1.38*  --  1.45*  CALCIUM 9.6  --  8.3*  MG  --  1.8  --     Liver Function Tests:  Recent Labs Lab 05/09/13 1232 05/10/13 0453  AST 159* 221*  ALT 464* 432*  ALKPHOS 239* 215*  BILITOT 6.6* 6.2*  PROT 8.0 7.2  ALBUMIN 4.6 3.6    Recent Labs Lab 05/09/13 1232  LIPASE >3000*   No results found for this basename: AMMONIA,  in the last 168 hours  CBC:  Recent Labs Lab 05/09/13 1232 05/10/13 0453  WBC 34.0* 28.3*  NEUTROABS 24.6*  --   HGB 17.5* 18.4*  HCT 49.4 52.6*  MCV 91.8 92.0  PLT 239 207    Cardiac Enzymes: No results found for this basename: CKTOTAL, CKMB, CKMBINDEX, TROPONINI,  in the last 168 hours  BNP: No components found with this basename: POCBNP,   CBG:  Recent Labs Lab 05/09/13 1719 05/09/13 1956 05/10/13 0013 05/10/13 0420 05/10/13 0811  GLUCAP 201* 219* 214* 207* 156*    Coagulation Studies: No results found for this basename: LABPROT, INR,  in the last 72 hours   Other results: Tele:  Sinus tach at 140 .  Imaging: US Abdomen Complete  05/09/2013   CLINICAL DATA:  Right upper quadrant pain.  EXAM: ULTRASOUND ABDOMEN COMPLETE  COMPARISON:  None.  FINDINGS: Gallbladder:  There are multiple echogenic stones in the gallbladder. Gallbladder wall is  mildly thickened, measuring 0.3 cm. There is hypoechoic material in the right upper quadrant suggestive for complex fluid. The complex fluid extends into the right flank region. There is simple appearing fluid around the gallbladder. Reportedly, the patient does not have a sonographic Murphy's sign but the patient has received pain medication.  Common bile duct:  Diameter: 0.5 cm  Liver:  Increased echogenicity of the liver suggests hepatic steatosis. The liver parenchyma is also heterogeneous with loss of the internal architecture.  IVC:  Not well seen.  Pancreas:  Not well seen.  Spleen:  Size and appearance within normal limits. Spleen measures 6.4 cm in length.  Right Kidney:  Length: 10.5 cm. Echogenicity within normal limits. No mass or hydronephrosis visualized.  Left Kidney:  Length: 10.8 cm. Echogenicity within normal limits. No mass or hydronephrosis visualized.  Abdominal aorta:  No aneurysm visualized.  Other findings:  None.  IMPRESSION: Cholelithiasis with mild gallbladder wall thickening. There is complex fluid in the right upper quadrant suggesting an acute inflammatory process.  Hepatic steatosis.   Electronically Signed   By: Richarda Overlie M.D.   On: 05/09/2013 15:25   Ct Abdomen Pelvis W Contrast  05/09/2013   CLINICAL DATA:  Abdominal pain.  EXAM: CT ABDOMEN AND PELVIS WITH CONTRAST  TECHNIQUE: Multidetector CT imaging of the abdomen and pelvis was performed using the standard protocol following bolus administration of intravenous contrast.  CONTRAST:  80mL OMNIPAQUE IOHEXOL 300 MG/ML  SOLN  COMPARISON:  Ultrasound 05/09/2013  FINDINGS: Lung bases are clear.  There is no evidence for free air.  Decreased attenuation of the liver is consistent with hepatic steatosis. There is a small amount of fluid along the right hepatic dome. There is fluid around the inferior gallbladder and the duodenum. There are multiple gallstones. The gallbladder wall is dense and could represent hyperemia. Portal venous  system is patent. There is marked inflammation involving the descending duodenum. There is wall thickening along the medial duodenum wall, measuring up to 1.6 cm. Multiple calcifications in the distal common bile duct are suggestive for choledocholithiasis. There is no significant common bile duct dilatation. The base of the gallbladder or the cystic duct is distended, measuring up to 1.5 cm, and containing multiple stones. There is fluid or edema in the porta hepatitis. There is fluid and edema tracking around the pancreas. Fluid extends into the left upper quadrant and adjacent to the stomach and spleen. No significant pancreatic duct dilatation. Normal appearance of the spleen.  There is a 1.7 cm low-density structure along the right kidney upper pole. The Hounsfield units roughly measure 39 and not consistent with a simple cyst. However, the Hounsfield units do not significantly change on the delayed images and probably represents a proteinaceous or hemorrhagic cyst. There is a small cyst along the medial right kidney. Question a punctate stone in the right kidney without hydronephrosis. There are two nonobstructive left kidney stones.  No gross abnormality to the prostate, seminal vesicles or urinary bladder. No significant lymphadenopathy. No acute inflammation involving the appendix. Normal appearance of the jejunum and ileum. No gross abnormality to the colon.  Bilateral pars defects at L5. No significant anterolisthesis at L5-S1.  IMPRESSION: There is extensive fluid and inflammation in the right upper quadrant, centered around the duodenum. There is marked asymmetric duodenal wall thickening with stones in the distal common bile duct. Evidence for pancreatitis.  Multiple gallstones and cannot exclude cholecystitis.  Decreased attenuation of the liver suggests hepatic steatosis.  Nonobstructive kidney stones.  1.7 cm indeterminate right renal structure. This probably represents a hyperdense or proteinaceous  renal cyst but indeterminate. Consider follow-up ultrasound after the acute process has resolved. This area was not well evaluated on today's ultrasound.  These results were called by telephone at the time of interpretation on 05/09/2013 at 3:43 PM to Dr. Emilia Beck , who verbally  acknowledged these results.   Electronically Signed   By: Richarda Overlie M.D.   On: 05/09/2013 15:47   Dg Ercp Biliary & Pancreatic Ducts  05/10/2013   CLINICAL DATA:  Biliary stent placement.  Common duct stone  EXAM: ERCP  TECHNIQUE: Multiple spot images obtained with the fluoroscopic device and submitted for interpretation post-procedure.  COMPARISON:  CT 05/09/2013  FINDINGS: Three digital images were obtained. There is partial opacification of the bile ducts with contrast. Question small common duct stones. Biliary tree not completely evaluated for stones. No obstruction  Plastic common bile duct stent placed in good position.  IMPRESSION: Common bile duct stent placed at ERCP.  These images were submitted for radiologic interpretation only. Please see the procedural report for the amount of contrast and the fluoroscopy time utilized.   Electronically Signed   By: Marlan Palau M.D.   On: 05/10/2013 15:05       Assessment & Plan:  1. Sinus tach:   The sinus tach appears to be due to his infection.  Now that the HR is slower, I can see P waves and I think this is secondary to his underlying abdominal problems.   Will give him IV metoprolol 5 mg Q 6 hr.  More if needed. This should help control HR while his gall stone / infection is cleared.   2. HTN:  bp is ok for new.  I would use metoprolol IV as needed while he is NPO.     3. Hyperlipidemia:   Hold simva for now    Alvia Grove., MD, [facility] 05/10/2013, 3:38 PM

## 2013-05-10 NOTE — Interval H&P Note (Signed)
History and Physical Interval Note:  05/10/2013 11:27 AM  Nicolas Chandler  has presented today for surgery, with the diagnosis of Choledocholithiasis and obstructive jaundice  The various methods of treatment have been discussed with the patient and family. After consideration of risks, benefits and other options for treatment, the patient has consented to  Procedure(s): ENDOSCOPIC RETROGRADE CHOLANGIOPANCREATOGRAPHY (ERCP) (N/A) as a surgical intervention .  The patient's history has been reviewed, patient examined, no change in status, stable for surgery.  I have reviewed the patient's chart and labs.  Questions were answered to the patient's satisfaction.     Doran Nestle D

## 2013-05-10 NOTE — Consult Note (Signed)
Reason for Consult:  Gallstone pancreatitis; choledocholithiasis Referring Physician: Dr. Rickey Barbara    Nicolas Chandler is an 47 y.o. male.  HPI: *47 y/o [place] gentleman who started having abdominal pain mostly at night about 2-3 weeks ago. He thought it was just reflux or something like that.  He just tolerated it.  Yesterday his pain became significantly worse, he says it hurts to breath deeply. He has become jaundice and he presented to the ER.  Work up shows elevated LFT'S, lipase >3000, elevated WBC 34,000. K+ 2.9, ULTRA SOUND OF THE ABDOMEN that shows: Cholelithiasis with mild gallbladder wall thickening. There is complex fluid in the right upper quadrant suggesting an acute  inflammatory process. CT SCAN:  extensive fluid and inflammation in the right upper  quadrant, centered around the duodenum. There is marked asymmetric  duodenal wall thickening with stones in the distal common bile duct.  Evidence for pancreatitis.  Multiple gallstones and cannot exclude cholecystitis.  Decreased attenuation of the liver suggests hepatic steatosis.   He has been seen by Dr. Elnoria Howard from GI and he plans ERCP today.   WBC is improving on Unasyn, IV.  LFT's show ongoing elevation.  We are ask to see and discuss cholecystectomy.    Past Medical History  Diagnosis Date  . Diabetes mellitus without complication   . Hypertension   . Cholecystitis 04/2013  . Fatty liver   . Pancreatitis     Past Surgical History  Procedure Laterality Date  . No past surgeries      History reviewed. No pertinent family history.  Social History:  reports that he has never smoked. He has never used smokeless tobacco. He reports that he does not drink alcohol or use illicit drugs.  Allergies: No Known Allergies  Medications:  Prior to Admission:  Prescriptions prior to admission  Medication Sig Dispense Refill  . glimepiride (AMARYL) 4 MG tablet Take 4 mg by mouth 2 (two) times daily.      Marland Kitchen lisinopril  (PRINIVIL,ZESTRIL) 5 MG tablet Take 5 mg by mouth daily.      . metFORMIN (GLUCOPHAGE) 500 MG tablet Take 500 mg by mouth 2 (two) times daily with a meal.      . OVER THE COUNTER MEDICATION Take 1 tablet by mouth once.      . simvastatin (ZOCOR) 20 MG tablet Take 20 mg by mouth daily.       Scheduled: . ampicillin-sulbactam (UNASYN) IV  3 g Intravenous Q6H  . insulin aspart  0-15 Units Subcutaneous Q4H   Continuous: . 0.9 % NaCl with KCl 40 mEq / L 125 mL/hr at 05/10/13 0417   ZOX:[id number] (DILAUDID) injection, ondansetron (ZOFRAN) IV Anti-infectives   Start     Dose/Rate Route Frequency Ordered Stop   05/09/13 2200  Ampicillin-Sulbactam (UNASYN) 3 g in sodium chloride 0.9 % 100 mL IVPB     3 g 100 mL/hr over 60 Minutes Intravenous Every 6 hours 05/09/13 1733     05/09/13 1630  Ampicillin-Sulbactam (UNASYN) 3 g in sodium chloride 0.9 % 100 mL IVPB     3 g 100 mL/hr over 60 Minutes Intravenous  Once 05/09/13 1605 05/09/13 1721      Results for orders placed during the hospital encounter of 05/09/13 (from the past 48 hour(s))  CBC WITH DIFFERENTIAL     Status: Abnormal   Collection Time    05/09/13 12:32 PM      Result Value Range   WBC 34.0 (*) 4.0 - 10.5  K/uL   RBC 5.38  4.22 - 5.81 MIL/uL   Hemoglobin 17.5 (*) 13.0 - 17.0 g/dL   HCT 16.1  09.6 - 04.5 %   MCV 91.8  78.0 - 100.0 fL   MCH 32.5  26.0 - 34.0 pg   MCHC 35.4  30.0 - 36.0 g/dL   RDW 40.9  81.1 - 91.4 %   Platelets 239  150 - 400 K/uL   Neutrophils Relative % 72  43 - 77 %   Lymphocytes Relative 21  12 - 46 %   Monocytes Relative 6  3 - 12 %   Eosinophils Relative 1  0 - 5 %   Basophils Relative 0  0 - 1 %   Neutro Abs 24.6 (*) 1.7 - 7.7 K/uL   Lymphs Abs 7.1 (*) 0.7 - 4.0 K/uL   Monocytes Absolute 2.0 (*) 0.1 - 1.0 K/uL   Eosinophils Absolute 0.3  0.0 - 0.7 K/uL   Basophils Absolute 0.0  0.0 - 0.1 K/uL   WBC Morphology WHITE COUNT CONFIRMED ON SMEAR     Comment: ABSOLUTE LYMPHOCYTOSIS     FEW  NEUTROPHIL BANDS NOTED  COMPREHENSIVE METABOLIC PANEL     Status: Abnormal   Collection Time    05/09/13 12:32 PM      Result Value Range   Sodium 136  135 - 145 mEq/L   Potassium 2.9 (*) 3.5 - 5.1 mEq/L   Chloride 96  96 - 112 mEq/L   CO2 24  19 - 32 mEq/L   Glucose, Bld 201 (*) 70 - 99 mg/dL   BUN 14  6 - 23 mg/dL   Creatinine, Ser 7.82 (*) 0.50 - 1.35 mg/dL   Calcium 9.6  8.4 - 95.6 mg/dL   Total Protein 8.0  6.0 - 8.3 g/dL   Albumin 4.6  3.5 - 5.2 g/dL   AST 213 (*) 0 - 37 U/L   ALT 464 (*) 0 - 53 U/L   Alkaline Phosphatase 239 (*) 39 - 117 U/L   Total Bilirubin 6.6 (*) 0.3 - 1.2 mg/dL   GFR calc non Af Amer 60 (*) >90 mL/min   GFR calc Af Amer 70 (*) >90 mL/min   Comment: (NOTE)     The eGFR has been calculated using the CKD EPI equation.     This calculation has not been validated in all clinical situations.     eGFR's persistently <90 mL/min signify possible Chronic Kidney     Disease.  LIPASE, BLOOD     Status: Abnormal   Collection Time    05/09/13 12:32 PM      Result Value Range   Lipase >3000 (*) 11 - 59 U/L   Comment: RESULT CONFIRMED BY AUTOMATED DILUTION  GLUCOSE, CAPILLARY     Status: Abnormal   Collection Time    05/09/13 12:44 PM      Result Value Range   Glucose-Capillary 182 (*) 70 - 99 mg/dL   Comment 1 Documented in Chart    URINALYSIS, ROUTINE W REFLEX MICROSCOPIC     Status: Abnormal   Collection Time    05/09/13  1:47 PM      Result Value Range   Color, Urine ORANGE (*) YELLOW   Comment: BIOCHEMICALS MAY BE AFFECTED BY COLOR   APPearance CLEAR  CLEAR   Specific Gravity, Urine 1.016  1.005 - 1.030   pH 5.0  5.0 - 8.0   Glucose, UA NEGATIVE  NEGATIVE mg/dL   Hgb urine  dipstick NEGATIVE  NEGATIVE   Bilirubin Urine LARGE (*) NEGATIVE   Ketones, ur 15 (*) NEGATIVE mg/dL   Protein, ur NEGATIVE  NEGATIVE mg/dL   Urobilinogen, UA 0.2  0.0 - 1.0 mg/dL   Nitrite NEGATIVE  NEGATIVE   Leukocytes, UA TRACE (*) NEGATIVE  URINE MICROSCOPIC-ADD ON      Status: Abnormal   Collection Time    05/09/13  1:47 PM      Result Value Range   Squamous Epithelial / LPF RARE  RARE   WBC, UA 0-2  <3 WBC/hpf   RBC / HPF 0-2  <3 RBC/hpf   Bacteria, UA RARE  RARE   Casts HYALINE CASTS (*) NEGATIVE  GLUCOSE, CAPILLARY     Status: Abnormal   Collection Time    05/09/13  5:19 PM      Result Value Range   Glucose-Capillary 201 (*) 70 - 99 mg/dL   Comment 1 Notify RN    MAGNESIUM     Status: None   Collection Time    05/09/13  7:08 PM      Result Value Range   Magnesium 1.8  1.5 - 2.5 mg/dL  GLUCOSE, CAPILLARY     Status: Abnormal   Collection Time    05/09/13  7:56 PM      Result Value Range   Glucose-Capillary 219 (*) 70 - 99 mg/dL  GLUCOSE, CAPILLARY     Status: Abnormal   Collection Time    05/10/13 12:13 AM      Result Value Range   Glucose-Capillary 214 (*) 70 - 99 mg/dL  GLUCOSE, CAPILLARY     Status: Abnormal   Collection Time    05/10/13  4:20 AM      Result Value Range   Glucose-Capillary 207 (*) 70 - 99 mg/dL  COMPREHENSIVE METABOLIC PANEL     Status: Abnormal   Collection Time    05/10/13  4:53 AM      Result Value Range   Sodium 135  135 - 145 mEq/L   Potassium 5.6 (*) 3.5 - 5.1 mEq/L   Comment: DELTA CHECK NOTED     SLIGHT HEMOLYSIS   Chloride 104  96 - 112 mEq/L   CO2 17 (*) 19 - 32 mEq/L   Glucose, Bld 201 (*) 70 - 99 mg/dL   BUN 21  6 - 23 mg/dL   Creatinine, Ser 1.61 (*) 0.50 - 1.35 mg/dL   Calcium 8.3 (*) 8.4 - 10.5 mg/dL   Total Protein 7.2  6.0 - 8.3 g/dL   Albumin 3.6  3.5 - 5.2 g/dL   AST 096 (*) 0 - 37 U/L   ALT 432 (*) 0 - 53 U/L   Alkaline Phosphatase 215 (*) 39 - 117 U/L   Total Bilirubin 6.2 (*) 0.3 - 1.2 mg/dL   GFR calc non Af Amer 57 (*) >90 mL/min   GFR calc Af Amer 66 (*) >90 mL/min   Comment: (NOTE)     The eGFR has been calculated using the CKD EPI equation.     This calculation has not been validated in all clinical situations.     eGFR's persistently <90 mL/min signify possible Chronic  Kidney     Disease.  CBC     Status: Abnormal   Collection Time    05/10/13  4:53 AM      Result Value Range   WBC 28.3 (*) 4.0 - 10.5 K/uL   RBC 5.72  4.22 - 5.81 MIL/uL  Hemoglobin 18.4 (*) 13.0 - 17.0 g/dL   HCT 40.9 (*) 81.1 - 91.4 %   MCV 92.0  78.0 - 100.0 fL   MCH 32.2  26.0 - 34.0 pg   MCHC 35.0  30.0 - 36.0 g/dL   RDW 78.2  95.6 - 21.3 %   Platelets 207  150 - 400 K/uL  GLUCOSE, CAPILLARY     Status: Abnormal   Collection Time    05/10/13  8:11 AM      Result Value Range   Glucose-Capillary 156 (*) 70 - 99 mg/dL    US Abdomen Complete  05/09/2013   CLINICAL DATA:  Right upper quadrant pain.  EXAM: ULTRASOUND ABDOMEN COMPLETE  COMPARISON:  None.  FINDINGS: Gallbladder:  There are multiple echogenic stones in the gallbladder. Gallbladder wall is mildly thickened, measuring 0.3 cm. There is hypoechoic material in the right upper quadrant suggestive for complex fluid. The complex fluid extends into the right flank region. There is simple appearing fluid around the gallbladder. Reportedly, the patient does not have a sonographic Murphy's sign but the patient has received pain medication.  Common bile duct:  Diameter: 0.5 cm  Liver:  Increased echogenicity of the liver suggests hepatic steatosis. The liver parenchyma is also heterogeneous with loss of the internal architecture.  IVC:  Not well seen.  Pancreas:  Not well seen.  Spleen:  Size and appearance within normal limits. Spleen measures 6.4 cm in length.  Right Kidney:  Length: 10.5 cm. Echogenicity within normal limits. No mass or hydronephrosis visualized.  Left Kidney:  Length: 10.8 cm. Echogenicity within normal limits. No mass or hydronephrosis visualized.  Abdominal aorta:  No aneurysm visualized.  Other findings:  None.  IMPRESSION: Cholelithiasis with mild gallbladder wall thickening. There is complex fluid in the right upper quadrant suggesting an acute inflammatory process.  Hepatic steatosis.   Electronically Signed   By:  Richarda Overlie M.D.   On: 05/09/2013 15:25   Ct Abdomen Pelvis W Contrast  05/09/2013   CLINICAL DATA:  Abdominal pain.  EXAM: CT ABDOMEN AND PELVIS WITH CONTRAST  TECHNIQUE: Multidetector CT imaging of the abdomen and pelvis was performed using the standard protocol following bolus administration of intravenous contrast.  CONTRAST:  80mL OMNIPAQUE IOHEXOL 300 MG/ML  SOLN  COMPARISON:  Ultrasound 05/09/2013  FINDINGS: Lung bases are clear.  There is no evidence for free air.  Decreased attenuation of the liver is consistent with hepatic steatosis. There is a small amount of fluid along the right hepatic dome. There is fluid around the inferior gallbladder and the duodenum. There are multiple gallstones. The gallbladder wall is dense and could represent hyperemia. Portal venous system is patent. There is marked inflammation involving the descending duodenum. There is wall thickening along the medial duodenum wall, measuring up to 1.6 cm. Multiple calcifications in the distal common bile duct are suggestive for choledocholithiasis. There is no significant common bile duct dilatation. The base of the gallbladder or the cystic duct is distended, measuring up to 1.5 cm, and containing multiple stones. There is fluid or edema in the porta hepatitis. There is fluid and edema tracking around the pancreas. Fluid extends into the left upper quadrant and adjacent to the stomach and spleen. No significant pancreatic duct dilatation. Normal appearance of the spleen.  There is a 1.7 cm low-density structure along the right kidney upper pole. The Hounsfield units roughly measure 39 and not consistent with a simple cyst. However, the Hounsfield units do not significantly change  on the delayed images and probably represents a proteinaceous or hemorrhagic cyst. There is a small cyst along the medial right kidney. Question a punctate stone in the right kidney without hydronephrosis. There are two nonobstructive left kidney stones.  No  gross abnormality to the prostate, seminal vesicles or urinary bladder. No significant lymphadenopathy. No acute inflammation involving the appendix. Normal appearance of the jejunum and ileum. No gross abnormality to the colon.  Bilateral pars defects at L5. No significant anterolisthesis at L5-S1.  IMPRESSION: There is extensive fluid and inflammation in the right upper quadrant, centered around the duodenum. There is marked asymmetric duodenal wall thickening with stones in the distal common bile duct. Evidence for pancreatitis.  Multiple gallstones and cannot exclude cholecystitis.  Decreased attenuation of the liver suggests hepatic steatosis.  Nonobstructive kidney stones.  1.7 cm indeterminate right renal structure. This probably represents a hyperdense or proteinaceous renal cyst but indeterminate. Consider follow-up ultrasound after the acute process has resolved. This area was not well evaluated on today's ultrasound.  These results were called by telephone at the time of interpretation on 05/09/2013 at 3:43 PM to Dr. Emilia Beck , who verbally acknowledged these results.   Electronically Signed   By: Richarda Overlie M.D.   On: 05/09/2013 15:47    Review of Systems  Constitutional: Positive for fever, chills and weight loss. Negative for malaise/fatigue and diaphoresis.  HENT: Negative.   Eyes:       Eyes are yellow also.  Respiratory: Negative.        Hurts to breath deep now.  Cardiovascular: Negative.   Gastrointestinal: Positive for nausea, vomiting (just once yesterday), abdominal pain (he has had abdominal pain most at night for 3 weeks) and constipation (some). Negative for heartburn, diarrhea, blood in stool and melena.       Abdominal pain with deep inspiration  Genitourinary: Negative for dysuria, urgency, frequency, hematuria and flank pain.       Yellow  Musculoskeletal: Negative.   Skin:       Yellow now.  Neurological: Negative.  Negative for weakness.   Endo/Heme/Allergies: Negative.   Psychiatric/Behavioral: Negative.    Blood pressure 133/92, pulse 100, temperature 98.1 F (36.7 C), temperature source Oral, resp. rate 19, height 5\' 6"  (1.676 m), weight 71.4 kg (157 lb 6.5 oz), SpO2 99.00%. Physical Exam  Constitutional: He is oriented to person, place, and time. He appears well-developed and well-nourished.  Jaundice male from [place], in NAD, but feels terrible.  HENT:  Head: Normocephalic and atraumatic.  Nose: Nose normal.  Eyes: Pupils are equal, round, and reactive to light. Right eye exhibits no discharge. Left eye exhibits no discharge. Scleral icterus is present.  Neck: Normal range of motion. Neck supple. No JVD present. No thyromegaly present.  Cardiovascular: Regular rhythm, normal heart sounds and intact distal pulses.  Exam reveals no gallop.   No murmur heard. He is very tachycardic  Respiratory: Effort normal and breath sounds normal. No respiratory distress. He has no wheezes. He has no rales. He exhibits no tenderness.  GI: Soft. He exhibits no mass. There is tenderness (mid epigastric tenderness, all over.  hurts to breath deep.). There is no rebound and no guarding.  Musculoskeletal: He exhibits no edema.  Lymphadenopathy:    He has no cervical adenopathy.  Neurological: He is alert and oriented to person, place, and time. No cranial nerve deficit.  Skin: Skin is warm and dry. No rash noted. No erythema. No pallor.  Psychiatric: He has  a normal mood and affect. His behavior is normal. Judgment and thought content normal.    Assessment/Plan: 1.  Gallstone pancreatitis/choledocholithiasis 2.  AODM NID (6 years) 3.  Hypertension 4.  Hyperlipidemia 6.  Hepatic steatosis  Plan:  ERCP today, we will get his gallbladder out when pancreatitis is better, WBC and LFT's improved.  Nicolas Chandler 05/10/2013, 10:55 AM

## 2013-05-10 NOTE — H&P (View-Only) (Signed)
Reason for Consult: Choledocholithiasis, Cholelithiasis, and Pancreatitis Referring Physician: [facility]  Nicolas Chandler HPI: This is a 47 year old male with a PMH of DM and HTN admitted for choledocholithiasis, cholelithiasis, and pancreatitis.  His symptoms started 3 weeks ago and he thought it was as a result of GERD, but it was unresponsive to Zantac.  He subsequently had intermittent symptoms over the intervening time period, but today he started to have unremitting pain.  The patient presented to the ER and he was identified to have the above findings.  No prior issues with his gallbladder until this time.  As a result of the findings a GI consultation was requested.  Past Medical History  Diagnosis Date  . Diabetes mellitus without complication   . Hypertension     History reviewed. No pertinent past surgical history.  History reviewed. No pertinent family history.  Social History:  reports that he has never smoked. He does not have any smokeless tobacco history on file. He reports that he does not drink alcohol. His drug history is not on file.  Allergies: No Known Allergies  Medications:  Scheduled:  Continuous: . ampicillin-sulbactam (UNASYN) IV 3 g (05/09/13 1621)    Results for orders placed during the hospital encounter of 05/09/13 (from the past 24 hour(s))  CBC WITH DIFFERENTIAL     Status: Abnormal   Collection Time    05/09/13 12:32 PM      Result Value Range   WBC 34.0 (*) 4.0 - 10.5 K/uL   RBC 5.38  4.22 - 5.81 MIL/uL   Hemoglobin 17.5 (*) 13.0 - 17.0 g/dL   HCT 96.0  45.4 - 09.8 %   MCV 91.8  78.0 - 100.0 fL   MCH 32.5  26.0 - 34.0 pg   MCHC 35.4  30.0 - 36.0 g/dL   RDW 11.9  14.7 - 82.9 %   Platelets 239  150 - 400 K/uL   Neutrophils Relative % 72  43 - 77 %   Lymphocytes Relative 21  12 - 46 %   Monocytes Relative 6  3 - 12 %   Eosinophils Relative 1  0 - 5 %   Basophils Relative 0  0 - 1 %   Neutro Abs 24.6 (*) 1.7 - 7.7 K/uL   Lymphs Abs 7.1  (*) 0.7 - 4.0 K/uL   Monocytes Absolute 2.0 (*) 0.1 - 1.0 K/uL   Eosinophils Absolute 0.3  0.0 - 0.7 K/uL   Basophils Absolute 0.0  0.0 - 0.1 K/uL   WBC Morphology WHITE COUNT CONFIRMED ON SMEAR    COMPREHENSIVE METABOLIC PANEL     Status: Abnormal   Collection Time    05/09/13 12:32 PM      Result Value Range   Sodium 136  135 - 145 mEq/L   Potassium 2.9 (*) 3.5 - 5.1 mEq/L   Chloride 96  96 - 112 mEq/L   CO2 24  19 - 32 mEq/L   Glucose, Bld 201 (*) 70 - 99 mg/dL   BUN 14  6 - 23 mg/dL   Creatinine, Ser 5.62 (*) 0.50 - 1.35 mg/dL   Calcium 9.6  8.4 - 13.0 mg/dL   Total Protein 8.0  6.0 - 8.3 g/dL   Albumin 4.6  3.5 - 5.2 g/dL   AST 865 (*) 0 - 37 U/L   ALT 464 (*) 0 - 53 U/L   Alkaline Phosphatase 239 (*) 39 - 117 U/L   Total Bilirubin 6.6 (*) 0.3 -  1.2 mg/dL   GFR calc non Af Amer 60 (*) >90 mL/min   GFR calc Af Amer 70 (*) >90 mL/min  LIPASE, BLOOD     Status: Abnormal   Collection Time    05/09/13 12:32 PM      Result Value Range   Lipase >3000 (*) 11 - 59 U/L  GLUCOSE, CAPILLARY     Status: Abnormal   Collection Time    05/09/13 12:44 PM      Result Value Range   Glucose-Capillary 182 (*) 70 - 99 mg/dL   Comment 1 Documented in Chart    URINALYSIS, ROUTINE W REFLEX MICROSCOPIC     Status: Abnormal   Collection Time    05/09/13  1:47 PM      Result Value Range   Color, Urine ORANGE (*) YELLOW   APPearance CLEAR  CLEAR   Specific Gravity, Urine 1.016  1.005 - 1.030   pH 5.0  5.0 - 8.0   Glucose, UA NEGATIVE  NEGATIVE mg/dL   Hgb urine dipstick NEGATIVE  NEGATIVE   Bilirubin Urine LARGE (*) NEGATIVE   Ketones, ur 15 (*) NEGATIVE mg/dL   Protein, ur NEGATIVE  NEGATIVE mg/dL   Urobilinogen, UA 0.2  0.0 - 1.0 mg/dL   Nitrite NEGATIVE  NEGATIVE   Leukocytes, UA TRACE (*) NEGATIVE  URINE MICROSCOPIC-ADD ON     Status: Abnormal   Collection Time    05/09/13  1:47 PM      Result Value Range   Squamous Epithelial / LPF RARE  RARE   WBC, UA 0-2  <3 WBC/hpf   RBC /  HPF 0-2  <3 RBC/hpf   Bacteria, UA RARE  RARE   Casts HYALINE CASTS (*) NEGATIVE     US Abdomen Complete  05/09/2013   CLINICAL DATA:  Right upper quadrant pain.  EXAM: ULTRASOUND ABDOMEN COMPLETE  COMPARISON:  None.  FINDINGS: Gallbladder:  There are multiple echogenic stones in the gallbladder. Gallbladder wall is mildly thickened, measuring 0.3 cm. There is hypoechoic material in the right upper quadrant suggestive for complex fluid. The complex fluid extends into the right flank region. There is simple appearing fluid around the gallbladder. Reportedly, the patient does not have a sonographic Murphy's sign but the patient has received pain medication.  Common bile duct:  Diameter: 0.5 cm  Liver:  Increased echogenicity of the liver suggests hepatic steatosis. The liver parenchyma is also heterogeneous with loss of the internal architecture.  IVC:  Not well seen.  Pancreas:  Not well seen.  Spleen:  Size and appearance within normal limits. Spleen measures 6.4 cm in length.  Right Kidney:  Length: 10.5 cm. Echogenicity within normal limits. No mass or hydronephrosis visualized.  Left Kidney:  Length: 10.8 cm. Echogenicity within normal limits. No mass or hydronephrosis visualized.  Abdominal aorta:  No aneurysm visualized.  Other findings:  None.  IMPRESSION: Cholelithiasis with mild gallbladder wall thickening. There is complex fluid in the right upper quadrant suggesting an acute inflammatory process.  Hepatic steatosis.   Electronically Signed   By: Richarda Overlie M.D.   On: 05/09/2013 15:25   Ct Abdomen Pelvis W Contrast  05/09/2013   CLINICAL DATA:  Abdominal pain.  EXAM: CT ABDOMEN AND PELVIS WITH CONTRAST  TECHNIQUE: Multidetector CT imaging of the abdomen and pelvis was performed using the standard protocol following bolus administration of intravenous contrast.  CONTRAST:  80mL OMNIPAQUE IOHEXOL 300 MG/ML  SOLN  COMPARISON:  Ultrasound 05/09/2013  FINDINGS: Lung bases  are clear.  There is no  evidence for free air.  Decreased attenuation of the liver is consistent with hepatic steatosis. There is a small amount of fluid along the right hepatic dome. There is fluid around the inferior gallbladder and the duodenum. There are multiple gallstones. The gallbladder wall is dense and could represent hyperemia. Portal venous system is patent. There is marked inflammation involving the descending duodenum. There is wall thickening along the medial duodenum wall, measuring up to 1.6 cm. Multiple calcifications in the distal common bile duct are suggestive for choledocholithiasis. There is no significant common bile duct dilatation. The base of the gallbladder or the cystic duct is distended, measuring up to 1.5 cm, and containing multiple stones. There is fluid or edema in the porta hepatitis. There is fluid and edema tracking around the pancreas. Fluid extends into the left upper quadrant and adjacent to the stomach and spleen. No significant pancreatic duct dilatation. Normal appearance of the spleen.  There is a 1.7 cm low-density structure along the right kidney upper pole. The Hounsfield units roughly measure 39 and not consistent with a simple cyst. However, the Hounsfield units do not significantly change on the delayed images and probably represents a proteinaceous or hemorrhagic cyst. There is a small cyst along the medial right kidney. Question a punctate stone in the right kidney without hydronephrosis. There are two nonobstructive left kidney stones.  No gross abnormality to the prostate, seminal vesicles or urinary bladder. No significant lymphadenopathy. No acute inflammation involving the appendix. Normal appearance of the jejunum and ileum. No gross abnormality to the colon.  Bilateral pars defects at L5. No significant anterolisthesis at L5-S1.  IMPRESSION: There is extensive fluid and inflammation in the right upper quadrant, centered around the duodenum. There is marked asymmetric duodenal wall  thickening with stones in the distal common bile duct. Evidence for pancreatitis.  Multiple gallstones and cannot exclude cholecystitis.  Decreased attenuation of the liver suggests hepatic steatosis.  Nonobstructive kidney stones.  1.7 cm indeterminate right renal structure. This probably represents a hyperdense or proteinaceous renal cyst but indeterminate. Consider follow-up ultrasound after the acute process has resolved. This area was not well evaluated on today's ultrasound.  These results were called by telephone at the time of interpretation on 05/09/2013 at 3:43 PM to Dr. Emilia Beck , who verbally acknowledged these results.   Electronically Signed   By: Richarda Overlie M.D.   On: 05/09/2013 15:47    ROS:  As stated above in the HPI otherwise negative.  Blood pressure 132/74, pulse 74, temperature 98.2 F (36.8 C), temperature source Oral, resp. rate 31, SpO2 98.00%.    PE: Gen: NAD, Alert and Oriented, uncomfortable HEENT:  [place]/AT, EOMI Neck: Supple, no LAD Lungs: CTA Bilaterally CV: RRR without M/G/R ABM: Soft, minimal epigastric tenderness, +BS Ext: No C/C/E Skin: Jaundiced  Assessment/Plan: 1) Choledocholithiasis. 2) Cholelithiasis. 3) Gallstone pancreatitis.   The patient has choledocholithiasis, but there is no ductal dilation.  There is a significant amount of inflammation around the duodenum.  I cannot discern if this is from his pancreatitis alone or if there is a component of cholecystitis.  His WBC is elevated, but he does not appear to be septic.  His WBC elevation can be from his pancreatitis.  Plan: 1) ERCP tomorrow. 2) Pain control. 3) Surgical consultation. 4) NPO. 5) Continue with antibiotics.  Briona Korpela D 05/09/2013, 4:52 PM

## 2013-05-10 NOTE — Progress Notes (Signed)
[facility] PROGRESS NOTE  Nicolas Chandler WUJ:[id number] DOB: May 11, 1966 DOA: 05/09/2013 PCP: [name], MD  Assessment/Plan: 1. adb pain due to pancreatitis due to choledocholithiasis 1. IVF, NPO 2. GI for ERCP today 3. On empiric ABX coverage per pharmacy 4. Recs for Surgical Consult - will consult 2. Hypokalemia 1. Monitor and replace as needed 3. Leukocytosis 1. Unasyn per pharmacy 4. DM 1. Holding PO meds, SSI 5. AKI 1. Cont on IVF 2. Follow renal function closely 6. HTN 1. Hold PO meds 2. Cont on PRNs 7. HLD 1. Holding PO meds 8. DVT prophylaxis 1. SCD's  Code Status: Full Family Communication: Pt in room (indicate person spoken with, relationship, and if by phone, the number) Disposition Plan: Pending  Consultants:  GI  General Surgery  Antibiotics: Unasyn 05/09/13>>>  HPI/Subjective: No acute events noted overnight  Objective: Filed Vitals:   05/09/13 1600 05/09/13 1700 05/09/13 2113 05/10/13 0539  BP: 132/74 155/96 148/95 133/92  Pulse: 74 76 93 100  Temp:  97.7 F (36.5 C) 97.6 F (36.4 C) 98.1 F (36.7 C)  TempSrc:  Oral    Resp:  18 18 19   Height: 5\' 6"  (1.676 m) 5\' 6"  (1.676 m)    Weight: 70 kg (154 lb 5.2 oz) 71.4 kg (157 lb 6.5 oz)    SpO2: 98% 99% 95% 99%    Intake/Output Summary (Last 24 hours) at 05/10/13 0839 Last data filed at 05/10/13 0500  Gross per 24 hour  Intake   2490 ml  Output      0 ml  Net   2490 ml   Filed Weights   05/09/13 1600 05/09/13 1700  Weight: 70 kg (154 lb 5.2 oz) 71.4 kg (157 lb 6.5 oz)    Exam:   General:  Awake, in nad  Cardiovascular: regular, s1, s2  Respiratory: normal resp effort, no wheezing  Abdomen: soft, nondistended  Musculoskeletal: perfused, no clubbing   Data Reviewed: Basic Metabolic Panel:  Recent Labs Lab 05/09/13 1232 05/09/13 1908 05/10/13 0453  NA 136  --  135  K 2.9*  --  5.6*  CL 96  --  104  CO2 24  --  17*  GLUCOSE 201*  --  201*  BUN 14   --  21  CREATININE 1.38*  --  1.45*  CALCIUM 9.6  --  8.3*  MG  --  1.8  --    Liver Function Tests:  Recent Labs Lab 05/09/13 1232 05/10/13 0453  AST 159* 221*  ALT 464* 432*  ALKPHOS 239* 215*  BILITOT 6.6* 6.2*  PROT 8.0 7.2  ALBUMIN 4.6 3.6    Recent Labs Lab 05/09/13 1232  LIPASE >3000*   No results found for this basename: AMMONIA,  in the last 168 hours CBC:  Recent Labs Lab 05/09/13 1232 05/10/13 0453  WBC 34.0* 28.3*  NEUTROABS 24.6*  --   HGB 17.5* 18.4*  HCT 49.4 52.6*  MCV 91.8 92.0  PLT 239 207   Cardiac Enzymes: No results found for this basename: CKTOTAL, CKMB, CKMBINDEX, TROPONINI,  in the last 168 hours BNP (last 3 results) No results found for this basename: PROBNP,  in the last 8760 hours CBG:  Recent Labs Lab 05/09/13 1719 05/09/13 1956 05/10/13 0013 05/10/13 0420 05/10/13 0811  GLUCAP 201* 219* 214* 207* 156*    No results found for this or any previous visit (from the past 240 hour(s)).   Studies: US Abdomen Complete  05/09/2013   CLINICAL DATA:  Right  upper quadrant pain.  EXAM: ULTRASOUND ABDOMEN COMPLETE  COMPARISON:  None.  FINDINGS: Gallbladder:  There are multiple echogenic stones in the gallbladder. Gallbladder wall is mildly thickened, measuring 0.3 cm. There is hypoechoic material in the right upper quadrant suggestive for complex fluid. The complex fluid extends into the right flank region. There is simple appearing fluid around the gallbladder. Reportedly, the patient does not have a sonographic Murphy's sign but the patient has received pain medication.  Common bile duct:  Diameter: 0.5 cm  Liver:  Increased echogenicity of the liver suggests hepatic steatosis. The liver parenchyma is also heterogeneous with loss of the internal architecture.  IVC:  Not well seen.  Pancreas:  Not well seen.  Spleen:  Size and appearance within normal limits. Spleen measures 6.4 cm in length.  Right Kidney:  Length: 10.5 cm. Echogenicity  within normal limits. No mass or hydronephrosis visualized.  Left Kidney:  Length: 10.8 cm. Echogenicity within normal limits. No mass or hydronephrosis visualized.  Abdominal aorta:  No aneurysm visualized.  Other findings:  None.  IMPRESSION: Cholelithiasis with mild gallbladder wall thickening. There is complex fluid in the right upper quadrant suggesting an acute inflammatory process.  Hepatic steatosis.   Electronically Signed   By: Richarda Overlie M.D.   On: 05/09/2013 15:25   Ct Abdomen Pelvis W Contrast  05/09/2013   CLINICAL DATA:  Abdominal pain.  EXAM: CT ABDOMEN AND PELVIS WITH CONTRAST  TECHNIQUE: Multidetector CT imaging of the abdomen and pelvis was performed using the standard protocol following bolus administration of intravenous contrast.  CONTRAST:  80mL OMNIPAQUE IOHEXOL 300 MG/ML  SOLN  COMPARISON:  Ultrasound 05/09/2013  FINDINGS: Lung bases are clear.  There is no evidence for free air.  Decreased attenuation of the liver is consistent with hepatic steatosis. There is a small amount of fluid along the right hepatic dome. There is fluid around the inferior gallbladder and the duodenum. There are multiple gallstones. The gallbladder wall is dense and could represent hyperemia. Portal venous system is patent. There is marked inflammation involving the descending duodenum. There is wall thickening along the medial duodenum wall, measuring up to 1.6 cm. Multiple calcifications in the distal common bile duct are suggestive for choledocholithiasis. There is no significant common bile duct dilatation. The base of the gallbladder or the cystic duct is distended, measuring up to 1.5 cm, and containing multiple stones. There is fluid or edema in the porta hepatitis. There is fluid and edema tracking around the pancreas. Fluid extends into the left upper quadrant and adjacent to the stomach and spleen. No significant pancreatic duct dilatation. Normal appearance of the spleen.  There is a 1.7 cm  low-density structure along the right kidney upper pole. The Hounsfield units roughly measure 39 and not consistent with a simple cyst. However, the Hounsfield units do not significantly change on the delayed images and probably represents a proteinaceous or hemorrhagic cyst. There is a small cyst along the medial right kidney. Question a punctate stone in the right kidney without hydronephrosis. There are two nonobstructive left kidney stones.  No gross abnormality to the prostate, seminal vesicles or urinary bladder. No significant lymphadenopathy. No acute inflammation involving the appendix. Normal appearance of the jejunum and ileum. No gross abnormality to the colon.  Bilateral pars defects at L5. No significant anterolisthesis at L5-S1.  IMPRESSION: There is extensive fluid and inflammation in the right upper quadrant, centered around the duodenum. There is marked asymmetric duodenal wall thickening with stones in  the distal common bile duct. Evidence for pancreatitis.  Multiple gallstones and cannot exclude cholecystitis.  Decreased attenuation of the liver suggests hepatic steatosis.  Nonobstructive kidney stones.  1.7 cm indeterminate right renal structure. This probably represents a hyperdense or proteinaceous renal cyst but indeterminate. Consider follow-up ultrasound after the acute process has resolved. This area was not well evaluated on today's ultrasound.  These results were called by telephone at the time of interpretation on 05/09/2013 at 3:43 PM to Dr. Emilia Beck , who verbally acknowledged these results.   Electronically Signed   By: Richarda Overlie M.D.   On: 05/09/2013 15:47    Scheduled Meds: . ampicillin-sulbactam (UNASYN) IV  3 g Intravenous Q6H  . insulin aspart  0-15 Units Subcutaneous Q4H   Continuous Infusions: . 0.9 % NaCl with KCl 40 mEq / L 125 mL/hr at 05/10/13 1610    Active Problems:   Choledocholithiasis   Abdominal pain   Leukocytosis   Hypokalemia   AKI (acute  kidney injury)   DM (diabetes mellitus)   HTN (hypertension)   HLD (hyperlipidemia)   Pancreatitis   Elevated LFTs  Time spent:  Hala Narula K  [facility] Pager [phone]. If 7PM-7AM, please contact night-coverage at [email or web address], password [facility] 05/10/2013, 8:39 AM  LOS: 1 day

## 2013-05-11 ENCOUNTER — Encounter (HOSPITAL_COMMUNITY): Payer: Self-pay | Admitting: Gastroenterology

## 2013-05-11 ENCOUNTER — Inpatient Hospital Stay (HOSPITAL_COMMUNITY): Payer: BC Managed Care – PPO

## 2013-05-11 DIAGNOSIS — A419 Sepsis, unspecified organism: Secondary | ICD-10-CM

## 2013-05-11 LAB — BASIC METABOLIC PANEL
Calcium: 7.8 mg/dL — ABNORMAL LOW (ref 8.4–10.5)
Creatinine, Ser: 2.19 mg/dL — ABNORMAL HIGH (ref 0.50–1.35)
GFR calc non Af Amer: 35 mL/min — ABNORMAL LOW (ref >90)
Glucose, Bld: 128 mg/dL — ABNORMAL HIGH (ref 70–99)
Sodium: 147 mEq/L — ABNORMAL HIGH (ref 135–145)

## 2013-05-11 LAB — COMPREHENSIVE METABOLIC PANEL
ALT: 226 U/L — ABNORMAL HIGH (ref 0–53)
AST: 105 U/L — ABNORMAL HIGH (ref 0–37)
Albumin: 3.1 g/dL — ABNORMAL LOW (ref 3.5–5.2)
Alkaline Phosphatase: 153 U/L — ABNORMAL HIGH (ref 39–117)
BUN: 45 mg/dL — ABNORMAL HIGH (ref 6–23)
CO2: 16 mEq/L — ABNORMAL LOW (ref 19–32)
Calcium: 8.3 mg/dL — ABNORMAL LOW (ref 8.4–10.5)
Chloride: 110 mEq/L (ref 96–112)
Creatinine, Ser: 2.8 mg/dL — ABNORMAL HIGH (ref 0.50–1.35)
GFR calc non Af Amer: 26 mL/min — ABNORMAL LOW (ref >90)
Potassium: 6 mEq/L — ABNORMAL HIGH (ref 3.5–5.1)
Total Bilirubin: 4.1 mg/dL — ABNORMAL HIGH (ref 0.3–1.2)
Total Protein: 6.8 g/dL (ref 6.0–8.3)

## 2013-05-11 LAB — CBC
HCT: 41.3 % (ref 39.0–52.0)
Hemoglobin: 15.2 g/dL (ref 13.0–17.0)
Hemoglobin: 13.5 g/dL (ref 13.0–17.0)
MCH: 31.5 pg (ref 26.0–34.0)
MCH: 31.3 pg (ref 26.0–34.0)
MCHC: 32.7 g/dL (ref 30.0–36.0)
MCV: 95 fL (ref 78.0–100.0)
MCV: 95.6 fL (ref 78.0–100.0)
Platelets: 133 10*3/uL — ABNORMAL LOW (ref 150–400)
Platelets: 90 10*3/uL — ABNORMAL LOW (ref 150–400)
RBC: 4.82 MIL/uL (ref 4.22–5.81)
RDW: 14.5 % (ref 11.5–15.5)
WBC: 28.1 10*3/uL — ABNORMAL HIGH (ref 4.0–10.5)

## 2013-05-11 LAB — BLOOD GAS, ARTERIAL
Acid-base deficit: 8.1 mmol/L — ABNORMAL HIGH (ref 0.0–2.0)
Drawn by: 310571
O2 Content: 4 L/min
O2 Saturation: 97.6 %
Patient temperature: 98.6
pCO2 arterial: 43.4 mmHg (ref 35.0–45.0)
pH, Arterial: 7.241 — ABNORMAL LOW (ref 7.350–7.450)

## 2013-05-11 LAB — GLUCOSE, CAPILLARY
Glucose-Capillary: 108 mg/dL — ABNORMAL HIGH (ref 70–99)
Glucose-Capillary: 106 mg/dL — ABNORMAL HIGH (ref 70–99)
Glucose-Capillary: 139 mg/dL — ABNORMAL HIGH (ref 70–99)
Glucose-Capillary: 88 mg/dL (ref 70–99)
Glucose-Capillary: 85 mg/dL (ref 70–99)

## 2013-05-11 LAB — POTASSIUM: Potassium: 5.1 mEq/L (ref 3.5–5.1)

## 2013-05-11 LAB — LIPASE, BLOOD: Lipase: 1778 U/L — ABNORMAL HIGH (ref 11–59)

## 2013-05-11 LAB — LACTIC ACID, PLASMA: Lactic Acid, Venous: 1.7 mmol/L (ref 0.5–2.2)

## 2013-05-11 MED ORDER — METOPROLOL TARTRATE 50 MG PO TABS
50.0000 mg | ORAL_TABLET | Freq: Three times a day (TID) | ORAL | Status: DC
  Administered 2013-05-11 – 2013-05-13 (×8): 50 mg via ORAL
  Filled 2013-05-11 (×10): qty 1

## 2013-05-11 MED ORDER — SODIUM CHLORIDE 0.9 % IV SOLN
INTRAVENOUS | Status: DC
  Administered 2013-05-11 – 2013-05-12 (×4): via INTRAVENOUS

## 2013-05-11 MED ORDER — SODIUM POLYSTYRENE SULFONATE 15 GM/60ML PO SUSP
7.5000 g | Freq: Once | ORAL | Status: AC
  Administered 2013-05-11: 7.5 g via ORAL
  Filled 2013-05-11: qty 60

## 2013-05-11 MED ORDER — SODIUM CHLORIDE 0.9 % IV BOLUS (SEPSIS)
1000.0000 mL | Freq: Once | INTRAVENOUS | Status: AC
  Administered 2013-05-11: 1000 mL via INTRAVENOUS

## 2013-05-11 NOTE — Progress Notes (Signed)
Nursing called and informed me that the patient's abdominal pain acutely increased around 1 PM.  My examination reveals that he is uncomfortable, but his abdominal exam does not appear to be changed from this AM's examination.  There is no rebound but he has tympany.  His abdomen is soft, but a mild to moderately tight.  Again, this was my finding when I evaluated him this AM and he was not complaining of abdominal pain.    I am still concerned about perforation, even though the X-rays were negative.  I will repeat abdominal films.  I also noted that his IV fluids were at 125 ml and I asked Nursing to increase it up to 200 ml/hour.  His creatinine was mildly improved and another BMP was just drawn.  I added on CBC to his blood draw.  Additionally, he could be having an ileus from his pancreatitis or third spacing of fluid in his abdomen.  I will await his abdominal films.

## 2013-05-11 NOTE — Progress Notes (Signed)
Patient ID: Nicolas Chandler, male   DOB: [birth year], 47 y.o.   MRN: [id number]    Subjective:  Still with some nausea.  No chest pain or dyspnea  Objective:  Filed Vitals:   05/11/13 0005 05/11/13 0200 05/11/13 0400 05/11/13 0600  BP: 117/82 126/94 130/89 134/78  Pulse: 121 130 137 138  Temp: 98 F (36.7 C)  98.4 F (36.9 C)   TempSrc: Oral  Oral   Resp: 25 23 23 11   Height:      Weight:      SpO2: 94% 91% 93% 97%    Intake/Output from previous day:  Intake/Output Summary (Last 24 hours) at 05/11/13 0817 Last data filed at 05/11/13 0300  Gross per 24 hour  Intake   2750 ml  Output      0 ml  Net   2750 ml    Physical Exam: Affect appropriate Ill Asian male  HEENT: normal Neck supple with no adenopathy JVP normal no bruits no thyromegaly Lungs clear with no wheezing and good diaphragmatic motion Heart:  S1/S2 no murmur, no rub, gallop or click PMI normal Abdomen: no tympany epigastric pain  no bruit.  No HSM or HJR Distal pulses intact with no bruits No edema Neuro non-focal Skin warm and dry No muscular weakness   Lab Results: Basic Metabolic Panel:  Recent Labs  78/29/56 1232 05/09/13 1908 05/10/13 0453 05/11/13 0315  NA 136  --  135 140  K 2.9*  --  5.6* 6.0*  CL 96  --  104 110  CO2 24  --  17* 16*  GLUCOSE 201*  --  201* 89  BUN 14  --  21 45*  CREATININE 1.38*  --  1.45* 2.80*  CALCIUM 9.6  --  8.3* 8.3*  MG  --  1.8  --   --    Liver Function Tests:  Recent Labs  05/10/13 0453 05/11/13 0315  AST 221* 105*  ALT 432* 226*  ALKPHOS 215* 153*  BILITOT 6.2* 4.1*  PROT 7.2 6.8  ALBUMIN 3.6 3.1*    Recent Labs  05/09/13 1232 05/11/13 0315  LIPASE >3000* 1778*   CBC:  Recent Labs  05/09/13 1232 05/10/13 0453 05/11/13 0315  WBC 34.0* 28.3* 28.1*  NEUTROABS 24.6*  --   --   HGB 17.5* 18.4* 15.2  HCT 49.4 52.6* 45.8  MCV 91.8 92.0 95.0  PLT 239 207 133*    Imaging: Dg Chest 1 View  05/10/2013   CLINICAL DATA:  Fever status  post ERCP  EXAM: CHEST - 1 VIEW  COMPARISON:  None.  FINDINGS: Cardiac shadow is within normal limits. The overall inspiratory effort is poor. Mild atelectatic changes are noted in the bases. No pneumothorax is seen. The upper abdomen shows gaseous distension of the colon.  IMPRESSION: Mild bibasilar atelectatic changes.   Electronically Signed   By: Alcide Clever M.D.   On: 05/10/2013 17:14   Dg Abd 1 View  05/10/2013   CLINICAL DATA:  Status post ERCP with abdominal pain  EXAM: ABDOMEN - 1 VIEW  COMPARISON:  None.  FINDINGS: Scattered large and small bowel gas is noted. There is gaseous distension of the: Likely related to the recent ERCP. Contrast material is noted within the right colon and appendix also related to the recent ERCP. A biliary stent is noted in the right upper quadrant. A small amount of biliary air is seen consistent with stent placement. No all evidence of free intraperitoneal air is seen.  IMPRESSION: Changes consistent with a recent ERCP. No acute abnormality is noted.   Electronically Signed   By: Alcide Clever M.D.   On: 05/10/2013 17:15   US Abdomen Complete  05/09/2013   CLINICAL DATA:  Right upper quadrant pain.  EXAM: ULTRASOUND ABDOMEN COMPLETE  COMPARISON:  None.  FINDINGS: Gallbladder:  There are multiple echogenic stones in the gallbladder. Gallbladder wall is mildly thickened, measuring 0.3 cm. There is hypoechoic material in the right upper quadrant suggestive for complex fluid. The complex fluid extends into the right flank region. There is simple appearing fluid around the gallbladder. Reportedly, the patient does not have a sonographic Murphy's sign but the patient has received pain medication.  Common bile duct:  Diameter: 0.5 cm  Liver:  Increased echogenicity of the liver suggests hepatic steatosis. The liver parenchyma is also heterogeneous with loss of the internal architecture.  IVC:  Not well seen.  Pancreas:  Not well seen.  Spleen:  Size and appearance within  normal limits. Spleen measures 6.4 cm in length.  Right Kidney:  Length: 10.5 cm. Echogenicity within normal limits. No mass or hydronephrosis visualized.  Left Kidney:  Length: 10.8 cm. Echogenicity within normal limits. No mass or hydronephrosis visualized.  Abdominal aorta:  No aneurysm visualized.  Other findings:  None.  IMPRESSION: Cholelithiasis with mild gallbladder wall thickening. There is complex fluid in the right upper quadrant suggesting an acute inflammatory process.  Hepatic steatosis.   Electronically Signed   By: Richarda Overlie M.D.   On: 05/09/2013 15:25   Ct Abdomen Pelvis W Contrast  05/09/2013   CLINICAL DATA:  Abdominal pain.  EXAM: CT ABDOMEN AND PELVIS WITH CONTRAST  TECHNIQUE: Multidetector CT imaging of the abdomen and pelvis was performed using the standard protocol following bolus administration of intravenous contrast.  CONTRAST:  80mL OMNIPAQUE IOHEXOL 300 MG/ML  SOLN  COMPARISON:  Ultrasound 05/09/2013  FINDINGS: Lung bases are clear.  There is no evidence for free air.  Decreased attenuation of the liver is consistent with hepatic steatosis. There is a small amount of fluid along the right hepatic dome. There is fluid around the inferior gallbladder and the duodenum. There are multiple gallstones. The gallbladder wall is dense and could represent hyperemia. Portal venous system is patent. There is marked inflammation involving the descending duodenum. There is wall thickening along the medial duodenum wall, measuring up to 1.6 cm. Multiple calcifications in the distal common bile duct are suggestive for choledocholithiasis. There is no significant common bile duct dilatation. The base of the gallbladder or the cystic duct is distended, measuring up to 1.5 cm, and containing multiple stones. There is fluid or edema in the porta hepatitis. There is fluid and edema tracking around the pancreas. Fluid extends into the left upper quadrant and adjacent to the stomach and spleen. No  significant pancreatic duct dilatation. Normal appearance of the spleen.  There is a 1.7 cm low-density structure along the right kidney upper pole. The Hounsfield units roughly measure 39 and not consistent with a simple cyst. However, the Hounsfield units do not significantly change on the delayed images and probably represents a proteinaceous or hemorrhagic cyst. There is a small cyst along the medial right kidney. Question a punctate stone in the right kidney without hydronephrosis. There are two nonobstructive left kidney stones.  No gross abnormality to the prostate, seminal vesicles or urinary bladder. No significant lymphadenopathy. No acute inflammation involving the appendix. Normal appearance of the jejunum and ileum. No gross abnormality  to the colon.  Bilateral pars defects at L5. No significant anterolisthesis at L5-S1.  IMPRESSION: There is extensive fluid and inflammation in the right upper quadrant, centered around the duodenum. There is marked asymmetric duodenal wall thickening with stones in the distal common bile duct. Evidence for pancreatitis.  Multiple gallstones and cannot exclude cholecystitis.  Decreased attenuation of the liver suggests hepatic steatosis.  Nonobstructive kidney stones.  1.7 cm indeterminate right renal structure. This probably represents a hyperdense or proteinaceous renal cyst but indeterminate. Consider follow-up ultrasound after the acute process has resolved. This area was not well evaluated on today's ultrasound.  These results were called by telephone at the time of interpretation on 05/09/2013 at 3:43 PM to Dr. Emilia Beck , who verbally acknowledged these results.   Electronically Signed   By: Richarda Overlie M.D.   On: 05/09/2013 15:47   Dg Ercp Biliary & Pancreatic Ducts  05/10/2013   CLINICAL DATA:  Biliary stent placement.  Common duct stone  EXAM: ERCP  TECHNIQUE: Multiple spot images obtained with the fluoroscopic device and submitted for interpretation  post-procedure.  COMPARISON:  CT 05/09/2013  FINDINGS: Three digital images were obtained. There is partial opacification of the bile ducts with contrast. Question small common duct stones. Biliary tree not completely evaluated for stones. No obstruction  Plastic common bile duct stent placed in good position.  IMPRESSION: Common bile duct stent placed at ERCP.  These images were submitted for radiologic interpretation only. Please see the procedural report for the amount of contrast and the fluoroscopy time utilized.   Electronically Signed   By: Marlan Palau M.D.   On: 05/10/2013 15:05    Cardiac Studies:  ECG: No orders found for this or any previous visit.   Telemetry:  Sinus tachycardia rate 120-128  Echo:   Medications:   . ampicillin-sulbactam (UNASYN) IV  3 g Intravenous Q6H  . insulin aspart  0-15 Units Subcutaneous Q4H  . metoprolol  5 mg Intravenous Q6H  . sodium chloride  1,000 mL Intravenous Once  . sodium polystyrene  7.5 g Oral Once     . sodium chloride 150 mL/hr at 05/11/13 2956    Assessment/Plan:  Tachycardia:  Getting some iv lopressor Discussed with surgery PA can give oral beta blocker to try to help with HR  However HIs HR response is probably physiologic For his severity of pancreatitis.  Very concerning that Cr is up despite being up 2.75 liters.  Echo is pending.  No murmur on exam.  Continue antibiotic coverage   Charlton Haws 05/11/2013, 8:17 AM

## 2013-05-11 NOTE — Progress Notes (Signed)
Doing a little better. Still significant SIRS and pancreatitis. I spoke to his wife as well. Await his improvement before lap chole. Patient examined and I agree with the assessment and plan  Violeta Gelinas, MD, MPH, FACS Pager: [phone]  05/11/2013 8:40 AM

## 2013-05-11 NOTE — Progress Notes (Signed)
Subjective: Feeling fine.  Back to his baseline level of pain.  Objective: Vital signs in last 24 hours: Temp:  [98 F (36.7 C)-101.5 F (38.6 C)] 98.4 F (36.9 C) (12/18 0400) Pulse Rate:  [121-163] 138 (12/18 0600) Resp:  [11-47] 11 (12/18 0600) BP: (104-152)/(67-119) 134/78 mmHg (12/18 0600) SpO2:  [90 %-97 %] 97 % (12/18 0600) Weight:  [160 lb 15 oz (73 kg)] 160 lb 15 oz (73 kg) (12/17 1525) Last BM Date: 05/09/13  Intake/Output from previous day: 12/17 0701 - 12/18 0700 In: 2750 [I.V.:2750] Out: -  Intake/Output this shift:    General appearance: alert and fatigued appearing GI: soft, non-tender; bowel sounds normal; no masses,  no organomegaly  Lab Results:  Recent Labs  05/09/13 1232 05/10/13 0453 05/11/13 0315  WBC 34.0* 28.3* 28.1*  HGB 17.5* 18.4* 15.2  HCT 49.4 52.6* 45.8  PLT 239 207 133*   BMET  Recent Labs  05/09/13 1232 05/10/13 0453 05/11/13 0315  NA 136 135 140  K 2.9* 5.6* 6.0*  CL 96 104 110  CO2 24 17* 16*  GLUCOSE 201* 201* 89  BUN 14 21 45*  CREATININE 1.38* 1.45* 2.80*  CALCIUM 9.6 8.3* 8.3*   LFT  Recent Labs  05/11/13 0315  PROT 6.8  ALBUMIN 3.1*  AST 105*  ALT 226*  ALKPHOS 153*  BILITOT 4.1*   PT/INR No results found for this basename: LABPROT, INR,  in the last 72 hours Hepatitis Panel No results found for this basename: HEPBSAG, HCVAB, HEPAIGM, HEPBIGM,  in the last 72 hours C-Diff No results found for this basename: CDIFFTOX,  in the last 72 hours Fecal Lactopherrin No results found for this basename: FECLLACTOFRN,  in the last 72 hours  Studies/Results: Dg Chest 1 View  05/10/2013   CLINICAL DATA:  Fever status post ERCP  EXAM: CHEST - 1 VIEW  COMPARISON:  None.  FINDINGS: Cardiac shadow is within normal limits. The overall inspiratory effort is poor. Mild atelectatic changes are noted in the bases. No pneumothorax is seen. The upper abdomen shows gaseous distension of the colon.  IMPRESSION: Mild bibasilar  atelectatic changes.   Electronically Signed   By: Alcide Clever M.D.   On: 05/10/2013 17:14   Dg Abd 1 View  05/10/2013   CLINICAL DATA:  Status post ERCP with abdominal pain  EXAM: ABDOMEN - 1 VIEW  COMPARISON:  None.  FINDINGS: Scattered large and small bowel gas is noted. There is gaseous distension of the: Likely related to the recent ERCP. Contrast material is noted within the right colon and appendix also related to the recent ERCP. A biliary stent is noted in the right upper quadrant. A small amount of biliary air is seen consistent with stent placement. No all evidence of free intraperitoneal air is seen.  IMPRESSION: Changes consistent with a recent ERCP. No acute abnormality is noted.   Electronically Signed   By: Alcide Clever M.D.   On: 05/10/2013 17:15   US Abdomen Complete  05/09/2013   CLINICAL DATA:  Right upper quadrant pain.  EXAM: ULTRASOUND ABDOMEN COMPLETE  COMPARISON:  None.  FINDINGS: Gallbladder:  There are multiple echogenic stones in the gallbladder. Gallbladder wall is mildly thickened, measuring 0.3 cm. There is hypoechoic material in the right upper quadrant suggestive for complex fluid. The complex fluid extends into the right flank region. There is simple appearing fluid around the gallbladder. Reportedly, the patient does not have a sonographic Murphy's sign but the patient has received  pain medication.  Common bile duct:  Diameter: 0.5 cm  Liver:  Increased echogenicity of the liver suggests hepatic steatosis. The liver parenchyma is also heterogeneous with loss of the internal architecture.  IVC:  Not well seen.  Pancreas:  Not well seen.  Spleen:  Size and appearance within normal limits. Spleen measures 6.4 cm in length.  Right Kidney:  Length: 10.5 cm. Echogenicity within normal limits. No mass or hydronephrosis visualized.  Left Kidney:  Length: 10.8 cm. Echogenicity within normal limits. No mass or hydronephrosis visualized.  Abdominal aorta:  No aneurysm visualized.   Other findings:  None.  IMPRESSION: Cholelithiasis with mild gallbladder wall thickening. There is complex fluid in the right upper quadrant suggesting an acute inflammatory process.  Hepatic steatosis.   Electronically Signed   By: Richarda Overlie M.D.   On: 05/09/2013 15:25   Ct Abdomen Pelvis W Contrast  05/09/2013   CLINICAL DATA:  Abdominal pain.  EXAM: CT ABDOMEN AND PELVIS WITH CONTRAST  TECHNIQUE: Multidetector CT imaging of the abdomen and pelvis was performed using the standard protocol following bolus administration of intravenous contrast.  CONTRAST:  80mL OMNIPAQUE IOHEXOL 300 MG/ML  SOLN  COMPARISON:  Ultrasound 05/09/2013  FINDINGS: Lung bases are clear.  There is no evidence for free air.  Decreased attenuation of the liver is consistent with hepatic steatosis. There is a small amount of fluid along the right hepatic dome. There is fluid around the inferior gallbladder and the duodenum. There are multiple gallstones. The gallbladder wall is dense and could represent hyperemia. Portal venous system is patent. There is marked inflammation involving the descending duodenum. There is wall thickening along the medial duodenum wall, measuring up to 1.6 cm. Multiple calcifications in the distal common bile duct are suggestive for choledocholithiasis. There is no significant common bile duct dilatation. The base of the gallbladder or the cystic duct is distended, measuring up to 1.5 cm, and containing multiple stones. There is fluid or edema in the porta hepatitis. There is fluid and edema tracking around the pancreas. Fluid extends into the left upper quadrant and adjacent to the stomach and spleen. No significant pancreatic duct dilatation. Normal appearance of the spleen.  There is a 1.7 cm low-density structure along the right kidney upper pole. The Hounsfield units roughly measure 39 and not consistent with a simple cyst. However, the Hounsfield units do not significantly change on the delayed images and  probably represents a proteinaceous or hemorrhagic cyst. There is a small cyst along the medial right kidney. Question a punctate stone in the right kidney without hydronephrosis. There are two nonobstructive left kidney stones.  No gross abnormality to the prostate, seminal vesicles or urinary bladder. No significant lymphadenopathy. No acute inflammation involving the appendix. Normal appearance of the jejunum and ileum. No gross abnormality to the colon.  Bilateral pars defects at L5. No significant anterolisthesis at L5-S1.  IMPRESSION: There is extensive fluid and inflammation in the right upper quadrant, centered around the duodenum. There is marked asymmetric duodenal wall thickening with stones in the distal common bile duct. Evidence for pancreatitis.  Multiple gallstones and cannot exclude cholecystitis.  Decreased attenuation of the liver suggests hepatic steatosis.  Nonobstructive kidney stones.  1.7 cm indeterminate right renal structure. This probably represents a hyperdense or proteinaceous renal cyst but indeterminate. Consider follow-up ultrasound after the acute process has resolved. This area was not well evaluated on today's ultrasound.  These results were called by telephone at the time of interpretation on  05/09/2013 at 3:43 PM to Dr. Emilia Beck , who verbally acknowledged these results.   Electronically Signed   By: Richarda Overlie M.D.   On: 05/09/2013 15:47   Dg Ercp Biliary & Pancreatic Ducts  05/10/2013   CLINICAL DATA:  Biliary stent placement.  Common duct stone  EXAM: ERCP  TECHNIQUE: Multiple spot images obtained with the fluoroscopic device and submitted for interpretation post-procedure.  COMPARISON:  CT 05/09/2013  FINDINGS: Three digital images were obtained. There is partial opacification of the bile ducts with contrast. Question small common duct stones. Biliary tree not completely evaluated for stones. No obstruction  Plastic common bile duct stent placed in good position.   IMPRESSION: Common bile duct stent placed at ERCP.  These images were submitted for radiologic interpretation only. Please see the procedural report for the amount of contrast and the fluoroscopy time utilized.   Electronically Signed   By: Marlan Palau M.D.   On: 05/10/2013 15:05    Medications:  Scheduled: . ampicillin-sulbactam (UNASYN) IV  3 g Intravenous Q6H  . insulin aspart  0-15 Units Subcutaneous Q4H  . metoprolol  5 mg Intravenous Q6H  . sodium chloride  1,000 mL Intravenous Once  . sodium polystyrene  7.5 g Oral Once   Continuous: . sodium chloride 150 mL/hr at 05/11/13 1610    Assessment/Plan: 1) Gallstone pancreatitis. 2) S/p choledocholithiasis with successful stent placement. 3) Cholelithiasis.  ? Cholecystitis.   No evidence of any perforation with the guidewire during the ERCP yesterday.  He reports that it was the insufflation that caused him to have pain.    Plan: 1) Continue with IV hydration. 2) Continue with pain control. 3) Continue with IV antibiotics. 4) Maintain NPO.   LOS: 2 days   Loretta Kluender D 05/11/2013, 8:17 AM

## 2013-05-11 NOTE — Progress Notes (Addendum)
Patient states that the pain in his abdomen is worsening and he feels like his belly is "getting wider and tighter".  Upon examination, his abdomen appeared more distended that this morning's shift assessment.  In addition, the abdomen was palpated and noted to be more firm and tender to touch that the morning assessment. Dr Elnoria Howard was notified of this change and agreed to come see the patient.

## 2013-05-11 NOTE — Progress Notes (Signed)
[facility] PROGRESS NOTE  Delsin Copen MVH:[id number] DOB: Oct 17, 1965 DOA: 05/09/2013 PCP: [name], MD  Assessment/Plan: 1. adb pain due to pancreatitis due to choledocholithiasis 1. IVF, NPO 2. GI following - s/p ERCP yesterday 3. On empiric unasyn 4. Surgery had been consulted 2. Sepsis with pancreatitis 1. Tachycardia with leukocytosis, fevers 2. On unasyn 3. Will check blood cultures 4. Check lactate and abg 5. Will cont aggressive volume resuscitation 6. Low threshold for Critical Care consult 3. Hypokalemia 1. Monitor and replace as needed 4. Leukocytosis 1. Unasyn per pharmacy 5. DM 1. Holding PO meds, SSI 6. AKI 1. Cont on IVF 2. Follow renal function closely 7. HTN 1. Hold PO meds 2. Cont on PRNs 8. HLD 1. Holding PO meds 9. DVT prophylaxis 1. SCD's 10. Sinus tach 1. Likely physiologic secondary to sepsis per above 2. Cardiology following  Code Status: Full Family Communication: Pt in room (indicate person spoken with, relationship, and if by phone, the number) Disposition Plan: Pending  Consultants:  GI  General Surgery  Cardiology  Antibiotics: Unasyn 05/09/13>>>  HPI/Subjective: Continues with abd pain  Objective: Filed Vitals:   05/11/13 0005 05/11/13 0200 05/11/13 0400 05/11/13 0600  BP: 117/82 126/94 130/89 134/78  Pulse: 121 130 137 138  Temp: 98 F (36.7 C)  98.4 F (36.9 C)   TempSrc: Oral  Oral   Resp: 25 23 23 11   Height:      Weight:      SpO2: 94% 91% 93% 97%    Intake/Output Summary (Last 24 hours) at 05/11/13 0801 Last data filed at 05/11/13 0300  Gross per 24 hour  Intake   2750 ml  Output      0 ml  Net   2750 ml   Filed Weights   05/09/13 1600 05/09/13 1700 05/10/13 1525  Weight: 70 kg (154 lb 5.2 oz) 71.4 kg (157 lb 6.5 oz) 73 kg (160 lb 15 oz)    Exam:   General:  Awake, in nad  Cardiovascular: regular, s1, s2  Respiratory: normal resp effort, no wheezing  Abdomen: soft,  nondistended  Musculoskeletal: perfused, no clubbing   Data Reviewed: Basic Metabolic Panel:  Recent Labs Lab 05/09/13 1232 05/09/13 1908 05/10/13 0453 05/11/13 0315  NA 136  --  135 140  K 2.9*  --  5.6* 6.0*  CL 96  --  104 110  CO2 24  --  17* 16*  GLUCOSE 201*  --  201* 89  BUN 14  --  21 45*  CREATININE 1.38*  --  1.45* 2.80*  CALCIUM 9.6  --  8.3* 8.3*  MG  --  1.8  --   --    Liver Function Tests:  Recent Labs Lab 05/09/13 1232 05/10/13 0453 05/11/13 0315  AST 159* 221* 105*  ALT 464* 432* 226*  ALKPHOS 239* 215* 153*  BILITOT 6.6* 6.2* 4.1*  PROT 8.0 7.2 6.8  ALBUMIN 4.6 3.6 3.1*    Recent Labs Lab 05/09/13 1232 05/11/13 0315  LIPASE >3000* 1778*   No results found for this basename: AMMONIA,  in the last 168 hours CBC:  Recent Labs Lab 05/09/13 1232 05/10/13 0453 05/11/13 0315  WBC 34.0* 28.3* 28.1*  NEUTROABS 24.6*  --   --   HGB 17.5* 18.4* 15.2  HCT 49.4 52.6* 45.8  MCV 91.8 92.0 95.0  PLT 239 207 133*   Cardiac Enzymes: No results found for this basename: CKTOTAL, CKMB, CKMBINDEX, TROPONINI,  in the last 168 hours BNP (  last 3 results) No results found for this basename: PROBNP,  in the last 8760 hours CBG:  Recent Labs Lab 05/10/13 1216 05/10/13 1606 05/10/13 2001 05/11/13 0007 05/11/13 0452  GLUCAP 149* 143* 124* 125* 108*    Recent Results (from the past 240 hour(s))  URINE CULTURE     Status: None   Collection Time    05/09/13  1:47 PM      Result Value Range Status   Specimen Description URINE, CLEAN CATCH   Final   Special Requests Normal   Final   Culture  Setup Time     Final   Value: 05/09/2013 22:24     Performed at [facility] Count     Final   Value: NO GROWTH     Performed at [facility]   Culture     Final   Value: NO GROWTH     Performed at [facility]   Report Status 05/10/2013 FINAL   Final     Studies: Dg Chest 1 View  05/10/2013   CLINICAL DATA:  Fever  status post ERCP  EXAM: CHEST - 1 VIEW  COMPARISON:  None.  FINDINGS: Cardiac shadow is within normal limits. The overall inspiratory effort is poor. Mild atelectatic changes are noted in the bases. No pneumothorax is seen. The upper abdomen shows gaseous distension of the colon.  IMPRESSION: Mild bibasilar atelectatic changes.   Electronically Signed   By: Alcide Clever M.D.   On: 05/10/2013 17:14   Dg Abd 1 View  05/10/2013   CLINICAL DATA:  Status post ERCP with abdominal pain  EXAM: ABDOMEN - 1 VIEW  COMPARISON:  None.  FINDINGS: Scattered large and small bowel gas is noted. There is gaseous distension of the: Likely related to the recent ERCP. Contrast material is noted within the right colon and appendix also related to the recent ERCP. A biliary stent is noted in the right upper quadrant. A small amount of biliary air is seen consistent with stent placement. No all evidence of free intraperitoneal air is seen.  IMPRESSION: Changes consistent with a recent ERCP. No acute abnormality is noted.   Electronically Signed   By: Alcide Clever M.D.   On: 05/10/2013 17:15   US Abdomen Complete  05/09/2013   CLINICAL DATA:  Right upper quadrant pain.  EXAM: ULTRASOUND ABDOMEN COMPLETE  COMPARISON:  None.  FINDINGS: Gallbladder:  There are multiple echogenic stones in the gallbladder. Gallbladder wall is mildly thickened, measuring 0.3 cm. There is hypoechoic material in the right upper quadrant suggestive for complex fluid. The complex fluid extends into the right flank region. There is simple appearing fluid around the gallbladder. Reportedly, the patient does not have a sonographic Murphy's sign but the patient has received pain medication.  Common bile duct:  Diameter: 0.5 cm  Liver:  Increased echogenicity of the liver suggests hepatic steatosis. The liver parenchyma is also heterogeneous with loss of the internal architecture.  IVC:  Not well seen.  Pancreas:  Not well seen.  Spleen:  Size and appearance  within normal limits. Spleen measures 6.4 cm in length.  Right Kidney:  Length: 10.5 cm. Echogenicity within normal limits. No mass or hydronephrosis visualized.  Left Kidney:  Length: 10.8 cm. Echogenicity within normal limits. No mass or hydronephrosis visualized.  Abdominal aorta:  No aneurysm visualized.  Other findings:  None.  IMPRESSION: Cholelithiasis with mild gallbladder wall thickening. There is complex fluid in the right upper  quadrant suggesting an acute inflammatory process.  Hepatic steatosis.   Electronically Signed   By: Richarda Overlie M.D.   On: 05/09/2013 15:25   Ct Abdomen Pelvis W Contrast  05/09/2013   CLINICAL DATA:  Abdominal pain.  EXAM: CT ABDOMEN AND PELVIS WITH CONTRAST  TECHNIQUE: Multidetector CT imaging of the abdomen and pelvis was performed using the standard protocol following bolus administration of intravenous contrast.  CONTRAST:  80mL OMNIPAQUE IOHEXOL 300 MG/ML  SOLN  COMPARISON:  Ultrasound 05/09/2013  FINDINGS: Lung bases are clear.  There is no evidence for free air.  Decreased attenuation of the liver is consistent with hepatic steatosis. There is a small amount of fluid along the right hepatic dome. There is fluid around the inferior gallbladder and the duodenum. There are multiple gallstones. The gallbladder wall is dense and could represent hyperemia. Portal venous system is patent. There is marked inflammation involving the descending duodenum. There is wall thickening along the medial duodenum wall, measuring up to 1.6 cm. Multiple calcifications in the distal common bile duct are suggestive for choledocholithiasis. There is no significant common bile duct dilatation. The base of the gallbladder or the cystic duct is distended, measuring up to 1.5 cm, and containing multiple stones. There is fluid or edema in the porta hepatitis. There is fluid and edema tracking around the pancreas. Fluid extends into the left upper quadrant and adjacent to the stomach and spleen. No  significant pancreatic duct dilatation. Normal appearance of the spleen.  There is a 1.7 cm low-density structure along the right kidney upper pole. The Hounsfield units roughly measure 39 and not consistent with a simple cyst. However, the Hounsfield units do not significantly change on the delayed images and probably represents a proteinaceous or hemorrhagic cyst. There is a small cyst along the medial right kidney. Question a punctate stone in the right kidney without hydronephrosis. There are two nonobstructive left kidney stones.  No gross abnormality to the prostate, seminal vesicles or urinary bladder. No significant lymphadenopathy. No acute inflammation involving the appendix. Normal appearance of the jejunum and ileum. No gross abnormality to the colon.  Bilateral pars defects at L5. No significant anterolisthesis at L5-S1.  IMPRESSION: There is extensive fluid and inflammation in the right upper quadrant, centered around the duodenum. There is marked asymmetric duodenal wall thickening with stones in the distal common bile duct. Evidence for pancreatitis.  Multiple gallstones and cannot exclude cholecystitis.  Decreased attenuation of the liver suggests hepatic steatosis.  Nonobstructive kidney stones.  1.7 cm indeterminate right renal structure. This probably represents a hyperdense or proteinaceous renal cyst but indeterminate. Consider follow-up ultrasound after the acute process has resolved. This area was not well evaluated on today's ultrasound.  These results were called by telephone at the time of interpretation on 05/09/2013 at 3:43 PM to Dr. Emilia Beck , who verbally acknowledged these results.   Electronically Signed   By: Richarda Overlie M.D.   On: 05/09/2013 15:47   Dg Ercp Biliary & Pancreatic Ducts  05/10/2013   CLINICAL DATA:  Biliary stent placement.  Common duct stone  EXAM: ERCP  TECHNIQUE: Multiple spot images obtained with the fluoroscopic device and submitted for interpretation  post-procedure.  COMPARISON:  CT 05/09/2013  FINDINGS: Three digital images were obtained. There is partial opacification of the bile ducts with contrast. Question small common duct stones. Biliary tree not completely evaluated for stones. No obstruction  Plastic common bile duct stent placed in good position.  IMPRESSION: Common bile  duct stent placed at ERCP.  These images were submitted for radiologic interpretation only. Please see the procedural report for the amount of contrast and the fluoroscopy time utilized.   Electronically Signed   By: Marlan Palau M.D.   On: 05/10/2013 15:05    Scheduled Meds: . ampicillin-sulbactam (UNASYN) IV  3 g Intravenous Q6H  . insulin aspart  0-15 Units Subcutaneous Q4H  . metoprolol  5 mg Intravenous Q6H  . sodium polystyrene  7.5 g Oral Once   Continuous Infusions: . sodium chloride 150 mL/hr at 05/11/13 1610    Active Problems:   Choledocholithiasis   Abdominal pain   Leukocytosis   Hypokalemia   AKI (acute kidney injury)   DM (diabetes mellitus)   HTN (hypertension)   HLD (hyperlipidemia)   Pancreatitis   Elevated LFTs  Time spent:  CHIU, STEPHEN K  [facility] Pager [phone]. If 7PM-7AM, please contact night-coverage at [email or web address], password [facility] 05/11/2013, 8:01 AM  LOS: 2 days

## 2013-05-11 NOTE — Progress Notes (Signed)
1 Day Post-Op  Subjective: He still feels pretty bad, his wife thinks he's less jaundice.  Objective: Vital signs in last 24 hours: Temp:  [98 F (36.7 C)-101.5 F (38.6 C)] 98.4 F (36.9 C) (12/18 0400) Pulse Rate:  [121-163] 138 (12/18 0600) Resp:  [11-47] 11 (12/18 0600) BP: (104-152)/(67-119) 134/78 mmHg (12/18 0600) SpO2:  [90 %-97 %] 97 % (12/18 0600) Weight:  [73 kg (160 lb 15 oz)] 73 kg (160 lb 15 oz) (12/17 1525) Last BM Date: 05/09/13 No urine output recorded. Afebrile with ongoing Sinus tachycardia Creatinine is up to 2.8, 1.38 on admission LFT's improving lipase still up 1778 WBC is still up to 28.1. Intake/Output from previous day: 12/17 0701 - 12/18 0700 In: 2750 [I.V.:2750] Out: -  Intake/Output this shift:    General appearance: alert, cooperative and still very uncomfortable. Resp: clear to auscultation bilaterally Cardio: sinus tachycardia, no arrythmia noted. GI: still sore and taking pain medicine regularly.  still complains of gassy feeling also.  Lab Results:   Recent Labs  05/10/13 0453 05/11/13 0315  WBC 28.3* 28.1*  HGB 18.4* 15.2  HCT 52.6* 45.8  PLT 207 133*    BMET  Recent Labs  05/10/13 0453 05/11/13 0315  NA 135 140  K 5.6* 6.0*  CL 104 110  CO2 17* 16*  GLUCOSE 201* 89  BUN 21 45*  CREATININE 1.45* 2.80*  CALCIUM 8.3* 8.3*   PT/INR No results found for this basename: LABPROT, INR,  in the last 72 hours   Recent Labs Lab 05/09/13 1232 05/10/13 0453 05/11/13 0315  AST 159* 221* 105*  ALT 464* 432* 226*  ALKPHOS 239* 215* 153*  BILITOT 6.6* 6.2* 4.1*  PROT 8.0 7.2 6.8  ALBUMIN 4.6 3.6 3.1*     Lipase     Component Value Date/Time   LIPASE 1778* 05/11/2013 0315     Studies/Results: Dg Chest 1 View  05/10/2013   CLINICAL DATA:  Fever status post ERCP  EXAM: CHEST - 1 VIEW  COMPARISON:  None.  FINDINGS: Cardiac shadow is within normal limits. The overall inspiratory effort is poor. Mild atelectatic changes  are noted in the bases. No pneumothorax is seen. The upper abdomen shows gaseous distension of the colon.  IMPRESSION: Mild bibasilar atelectatic changes.   Electronically Signed   By: Alcide Clever M.D.   On: 05/10/2013 17:14   Dg Abd 1 View  05/10/2013   CLINICAL DATA:  Status post ERCP with abdominal pain  EXAM: ABDOMEN - 1 VIEW  COMPARISON:  None.  FINDINGS: Scattered large and small bowel gas is noted. There is gaseous distension of the: Likely related to the recent ERCP. Contrast material is noted within the right colon and appendix also related to the recent ERCP. A biliary stent is noted in the right upper quadrant. A small amount of biliary air is seen consistent with stent placement. No all evidence of free intraperitoneal air is seen.  IMPRESSION: Changes consistent with a recent ERCP. No acute abnormality is noted.   Electronically Signed   By: Alcide Clever M.D.   On: 05/10/2013 17:15   US Abdomen Complete  05/09/2013   CLINICAL DATA:  Right upper quadrant pain.  EXAM: ULTRASOUND ABDOMEN COMPLETE  COMPARISON:  None.  FINDINGS: Gallbladder:  There are multiple echogenic stones in the gallbladder. Gallbladder wall is mildly thickened, measuring 0.3 cm. There is hypoechoic material in the right upper quadrant suggestive for complex fluid. The complex fluid extends into the right flank  region. There is simple appearing fluid around the gallbladder. Reportedly, the patient does not have a sonographic Murphy's sign but the patient has received pain medication.  Common bile duct:  Diameter: 0.5 cm  Liver:  Increased echogenicity of the liver suggests hepatic steatosis. The liver parenchyma is also heterogeneous with loss of the internal architecture.  IVC:  Not well seen.  Pancreas:  Not well seen.  Spleen:  Size and appearance within normal limits. Spleen measures 6.4 cm in length.  Right Kidney:  Length: 10.5 cm. Echogenicity within normal limits. No mass or hydronephrosis visualized.  Left Kidney:   Length: 10.8 cm. Echogenicity within normal limits. No mass or hydronephrosis visualized.  Abdominal aorta:  No aneurysm visualized.  Other findings:  None.  IMPRESSION: Cholelithiasis with mild gallbladder wall thickening. There is complex fluid in the right upper quadrant suggesting an acute inflammatory process.  Hepatic steatosis.   Electronically Signed   By: Richarda Overlie M.D.   On: 05/09/2013 15:25   Ct Abdomen Pelvis W Contrast  05/09/2013   CLINICAL DATA:  Abdominal pain.  EXAM: CT ABDOMEN AND PELVIS WITH CONTRAST  TECHNIQUE: Multidetector CT imaging of the abdomen and pelvis was performed using the standard protocol following bolus administration of intravenous contrast.  CONTRAST:  80mL OMNIPAQUE IOHEXOL 300 MG/ML  SOLN  COMPARISON:  Ultrasound 05/09/2013  FINDINGS: Lung bases are clear.  There is no evidence for free air.  Decreased attenuation of the liver is consistent with hepatic steatosis. There is a small amount of fluid along the right hepatic dome. There is fluid around the inferior gallbladder and the duodenum. There are multiple gallstones. The gallbladder wall is dense and could represent hyperemia. Portal venous system is patent. There is marked inflammation involving the descending duodenum. There is wall thickening along the medial duodenum wall, measuring up to 1.6 cm. Multiple calcifications in the distal common bile duct are suggestive for choledocholithiasis. There is no significant common bile duct dilatation. The base of the gallbladder or the cystic duct is distended, measuring up to 1.5 cm, and containing multiple stones. There is fluid or edema in the porta hepatitis. There is fluid and edema tracking around the pancreas. Fluid extends into the left upper quadrant and adjacent to the stomach and spleen. No significant pancreatic duct dilatation. Normal appearance of the spleen.  There is a 1.7 cm low-density structure along the right kidney upper pole. The Hounsfield units roughly  measure 39 and not consistent with a simple cyst. However, the Hounsfield units do not significantly change on the delayed images and probably represents a proteinaceous or hemorrhagic cyst. There is a small cyst along the medial right kidney. Question a punctate stone in the right kidney without hydronephrosis. There are two nonobstructive left kidney stones.  No gross abnormality to the prostate, seminal vesicles or urinary bladder. No significant lymphadenopathy. No acute inflammation involving the appendix. Normal appearance of the jejunum and ileum. No gross abnormality to the colon.  Bilateral pars defects at L5. No significant anterolisthesis at L5-S1.  IMPRESSION: There is extensive fluid and inflammation in the right upper quadrant, centered around the duodenum. There is marked asymmetric duodenal wall thickening with stones in the distal common bile duct. Evidence for pancreatitis.  Multiple gallstones and cannot exclude cholecystitis.  Decreased attenuation of the liver suggests hepatic steatosis.  Nonobstructive kidney stones.  1.7 cm indeterminate right renal structure. This probably represents a hyperdense or proteinaceous renal cyst but indeterminate. Consider follow-up ultrasound after the acute process  has resolved. This area was not well evaluated on today's ultrasound.  These results were called by telephone at the time of interpretation on 05/09/2013 at 3:43 PM to Dr. Emilia Beck , who verbally acknowledged these results.   Electronically Signed   By: Richarda Overlie M.D.   On: 05/09/2013 15:47   Dg Ercp Biliary & Pancreatic Ducts  05/10/2013   CLINICAL DATA:  Biliary stent placement.  Common duct stone  EXAM: ERCP  TECHNIQUE: Multiple spot images obtained with the fluoroscopic device and submitted for interpretation post-procedure.  COMPARISON:  CT 05/09/2013  FINDINGS: Three digital images were obtained. There is partial opacification of the bile ducts with contrast. Question small common  duct stones. Biliary tree not completely evaluated for stones. No obstruction  Plastic common bile duct stent placed in good position.  IMPRESSION: Common bile duct stent placed at ERCP.  These images were submitted for radiologic interpretation only. Please see the procedural report for the amount of contrast and the fluoroscopy time utilized.   Electronically Signed   By: Marlan Palau M.D.   On: 05/10/2013 15:05    Medications: . ampicillin-sulbactam (UNASYN) IV  3 g Intravenous Q6H  . insulin aspart  0-15 Units Subcutaneous Q4H  . metoprolol  5 mg Intravenous Q6H  . sodium polystyrene  7.5 g Oral Once    Assessment/Plan 1. Gallstone pancreatitis/choledocholithiasis  (LFT'S improving, lipase still elevated.      S/P ERCP and stent placement, Dr. Elnoria Howard 05/10/13. 2. AODM NID (6 years)  3. Hypertension  4. Hyperlipidemia  6. Hepatic steatosis  7.  Sinus tachycardia 8.  Ongoing leukocytosis/decilining platelet count  (concerned about sepsis) 9.  Worsening renal function/hyperkalemia  Plan:  Dr. Rhona Leavens is following closely and will begin sepsis protocol, increase hydration, and pain management.  Cardiology will start him on some oral lopressor to help with his tachycardia.  He will need to be medically stable before surgery and I have told him and his wife we won't do his surgery till he is much better. He is off heparin for DVT, add SCD's for now.   LOS: 2 days    Nicolas Chandler 05/11/2013

## 2013-05-12 ENCOUNTER — Inpatient Hospital Stay (HOSPITAL_COMMUNITY): Payer: BC Managed Care – PPO

## 2013-05-12 DIAGNOSIS — N281 Cyst of kidney, acquired: Secondary | ICD-10-CM

## 2013-05-12 DIAGNOSIS — N179 Acute kidney failure, unspecified: Secondary | ICD-10-CM

## 2013-05-12 LAB — COMPREHENSIVE METABOLIC PANEL
ALT: 141 U/L — ABNORMAL HIGH (ref 0–53)
Albumin: 2.7 g/dL — ABNORMAL LOW (ref 3.5–5.2)
Alkaline Phosphatase: 124 U/L — ABNORMAL HIGH (ref 39–117)
BUN: 31 mg/dL — ABNORMAL HIGH (ref 6–23)
CO2: 19 mEq/L (ref 19–32)
GFR calc Af Amer: 69 mL/min — ABNORMAL LOW (ref >90)
GFR calc non Af Amer: 59 mL/min — ABNORMAL LOW (ref >90)
Glucose, Bld: 102 mg/dL — ABNORMAL HIGH (ref 70–99)
Potassium: 4.3 mEq/L (ref 3.5–5.1)
Sodium: 147 mEq/L — ABNORMAL HIGH (ref 135–145)
Total Protein: 6.6 g/dL (ref 6.0–8.3)

## 2013-05-12 LAB — CBC
HCT: 40 % (ref 39.0–52.0)
Hemoglobin: 13 g/dL (ref 13.0–17.0)
MCH: 31.6 pg (ref 26.0–34.0)
MCHC: 32.5 g/dL (ref 30.0–36.0)
RBC: 4.12 MIL/uL — ABNORMAL LOW (ref 4.22–5.81)

## 2013-05-12 LAB — GLUCOSE, CAPILLARY
Glucose-Capillary: 100 mg/dL — ABNORMAL HIGH (ref 70–99)
Glucose-Capillary: 136 mg/dL — ABNORMAL HIGH (ref 70–99)
Glucose-Capillary: 150 mg/dL — ABNORMAL HIGH (ref 70–99)
Glucose-Capillary: 92 mg/dL (ref 70–99)

## 2013-05-12 MED ORDER — HEPARIN SODIUM (PORCINE) 5000 UNIT/ML IJ SOLN
5000.0000 [IU] | Freq: Three times a day (TID) | INTRAMUSCULAR | Status: DC
  Administered 2013-05-12 – 2013-05-16 (×15): 5000 [IU] via SUBCUTANEOUS
  Filled 2013-05-12 (×20): qty 1

## 2013-05-12 MED ORDER — DEXTROSE-NACL 5-0.45 % IV SOLN
INTRAVENOUS | Status: DC
  Administered 2013-05-12: 1000 mL via INTRAVENOUS
  Administered 2013-05-13 (×3): via INTRAVENOUS

## 2013-05-12 NOTE — Progress Notes (Signed)
2 Days Post-Op  Subjective: Up to sink and taking some clears.  Feeling better, he is voiding, but putting it in the toilet so it's not recorded.  Wife concerned he's more distended, and I agree.  Pain just generalized over the abdomen.  No nausea or vomiting. Objective: Vital signs in last 24 hours: Temp:  [97.4 F (36.3 C)-98.5 F (36.9 C)] 98 F (36.7 C) (12/19 0415) Pulse Rate:  [97-126] 97 (12/19 0600) Resp:  [11-29] 11 (12/19 0600) BP: (128-141)/(74-93) 140/91 mmHg (12/19 0600) SpO2:  [87 %-99 %] 98 % (12/19 0600) Weight:  [75.6 kg (166 lb 10.7 oz)] 75.6 kg (166 lb 10.7 oz) (12/19 0415) Last BM Date: 05/11/13 5 stool recorded no urine output recorded. BP is up, afebrile HR still in the 100 range Creatinine is dow to 1.4, no lipase, LFT's better, bilirubin down to 2.6.  WBC id down to 16.9 Platelet trending up but still low. Telem_  Sinus tachycardia Intake/Output from previous day: 12/18 0701 - 12/19 0700 In: 5899.2 [I.V.:4999.2; IV Piggyback:900] Out: -  Intake/Output this shift:    General appearance: alert, cooperative and no distress Resp: clear to auscultation bilaterally GI: He complains of pain generalized over mid abdomen.  he is distended this Am, + BS, he had multiple BM's recorded yesterday. He is tender, says it feels like a muscle cramp.  no peritonitis on exam.  Lab Results:   Recent Labs  05/11/13 1440 05/12/13 0505  WBC 19.1* 16.9*  HGB 13.5 13.0  HCT 41.3 40.0  PLT 90* 112*    BMET  Recent Labs  05/11/13 1220 05/11/13 1440 05/12/13 0505  NA 147*  --  147*  K 4.6 5.1 4.3  CL 119*  --  117*  CO2 17*  --  19  GLUCOSE 128*  --  102*  BUN 45*  --  31*  CREATININE 2.19*  --  1.40*  CALCIUM 7.8*  --  7.9*   PT/INR No results found for this basename: LABPROT, INR,  in the last 72 hours   Recent Labs Lab 05/09/13 1232 05/10/13 0453 05/11/13 0315 05/12/13 0505  AST 159* 221* 105* 77*  ALT 464* 432* 226* 141*  ALKPHOS 239* 215* 153*  124*  BILITOT 6.6* 6.2* 4.1* 2.6*  PROT 8.0 7.2 6.8 6.6  ALBUMIN 4.6 3.6 3.1* 2.7*     Lipase     Component Value Date/Time   LIPASE 1778* 05/11/2013 0315     Studies/Results: Dg Chest 1 View  05/10/2013   CLINICAL DATA:  Fever status post ERCP  EXAM: CHEST - 1 VIEW  COMPARISON:  None.  FINDINGS: Cardiac shadow is within normal limits. The overall inspiratory effort is poor. Mild atelectatic changes are noted in the bases. No pneumothorax is seen. The upper abdomen shows gaseous distension of the colon.  IMPRESSION: Mild bibasilar atelectatic changes.   Electronically Signed   By: Alcide Clever M.D.   On: 05/10/2013 17:14   Dg Abd 1 View  05/10/2013   CLINICAL DATA:  Status post ERCP with abdominal pain  EXAM: ABDOMEN - 1 VIEW  COMPARISON:  None.  FINDINGS: Scattered large and small bowel gas is noted. There is gaseous distension of the: Likely related to the recent ERCP. Contrast material is noted within the right colon and appendix also related to the recent ERCP. A biliary stent is noted in the right upper quadrant. A small amount of biliary air is seen consistent with stent placement. No all evidence of free  intraperitoneal air is seen.  IMPRESSION: Changes consistent with a recent ERCP. No acute abnormality is noted.   Electronically Signed   By: Alcide Clever M.D.   On: 05/10/2013 17:15   Dg Ercp Biliary & Pancreatic Ducts  05/10/2013   CLINICAL DATA:  Biliary stent placement.  Common duct stone  EXAM: ERCP  TECHNIQUE: Multiple spot images obtained with the fluoroscopic device and submitted for interpretation post-procedure.  COMPARISON:  CT 05/09/2013  FINDINGS: Three digital images were obtained. There is partial opacification of the bile ducts with contrast. Question small common duct stones. Biliary tree not completely evaluated for stones. No obstruction  Plastic common bile duct stent placed in good position.  IMPRESSION: Common bile duct stent placed at ERCP.  These images were  submitted for radiologic interpretation only. Please see the procedural report for the amount of contrast and the fluoroscopy time utilized.   Electronically Signed   By: Marlan Palau M.D.   On: 05/10/2013 15:05   Dg Abd 2 Views  05/11/2013   CLINICAL DATA:  Abdominal pain and distention.  ERCP yesterday.  EXAM: ABDOMEN - 2 VIEW  COMPARISON:  Radiograph dated 05/10/2013  FINDINGS: Common bile duct stent is in place, unchanged. There is atelectasis at both lung bases. No free air. There is air and contrast throughout the nondistended colon, similar to the appearance on the prior study. No dilated small bowel.  IMPRESSION: No evidence of free air. No significant change since the prior exam. Slight bibasilar atelectasis.   Electronically Signed   By: Geanie Cooley M.D.   On: 05/11/2013 18:17    Medications: . ampicillin-sulbactam (UNASYN) IV  3 g Intravenous Q6H  . insulin aspart  0-15 Units Subcutaneous Q4H  . metoprolol  5 mg Intravenous Q6H  . metoprolol tartrate  50 mg Oral Q8H   . dextrose 5 % and 0.45% NaCl @ 200 ml per hour.      Assessment/Plan . Gallstone pancreatitis/choledocholithiasis (LFT'S improving) S/P ERCP and stent placement, Dr. Elnoria Howard 05/10/13.  2. AODM NID (6 years)  3. Hypertension  4. Hyperlipidemia  6. Hepatic steatosis  7. Sinus tachycardia 8. Improving leukocytosis/decilining platelet count improving(concerned about sepsis)  9. Worsening renal function/hyperkalemia 10. Add heparin for DVT prophylaxis/SCD orders on chart already.    Plan:  I will add a lipase, but I think he's doing better.  He still has some discomfort with deep breathing but not like before. He is drinking some water and ice chips, I would not go beyond that for now.  Continue hydration, antibiotics, bowel rest, DVT prophylaxis.  Hopefully he will improve enough to do surgery next week.   LOS: 3 days    Darrnell Mangiaracina 05/12/2013

## 2013-05-12 NOTE — Progress Notes (Signed)
[facility] Progress Note [place] TEAM 1 - Stepdown ICU Team    Nicolas Chandler WUJ:[id number] DOB: 1966-01-07 DOA: 05/09/2013 PCP: [name], MD  Brief narrative: 47 year old patient with known history of diabetes hypertension and dyslipidemia. Presented to the hospital with a three-week history of abdominal pain. This pain worsened 24 hours prior to presentation and was associated with nausea and vomiting. The patient characterizes the pain as being quite severe. States he never had this type of discomfort before. Nothing improves the pain but nothing made it worse. He denied issues with fevers or chills chest pain or shortness of breath prior to presentation. Evaluation in the ER revealed elevated lipase and leukocytosis. Additional diagnostic evaluation revealed choledocholithiasis. Gastroenterology was consulted.  Assessment/Plan: Abd pain due to pancreatitis due to choledocholithiasis  - current pain seems to be related to abdominal distension- using Dilaudid frequently and asking for increase in dose -cont sips water -GI following - s/p ERCP w/stent placement -cont Unasyn for now since underwent instrumentation of biliary tree increased risk for bacterial translocation -Surgery consulted-?? eventual cholecystectomy after pancreatitis cools down  Sepsis with biliary pancreatitis  -Tachycardia with leukocytosis, fevers improving -blood cultures NGTD -cont IVFs watching for third spacing (abd distention, pulm edema)- 11 L positive as of today  Hypokalemia  -Monitor and replace as needed  DM  -Holding PO meds -cont SSI  AKI /hypernatremia/Dehydration -Cont on IVF but change to D51/2NS -Follow renal function closely  HTN  -Hold PO meds -Cont on PRNs  HLD  -Holding PO meds  Sinus tach  -Likely physiologic secondary to sepsis per above -Cardiology following -ECHO pending  DVT prophylaxis: Subcutaneous heparin Code Status: Full Family Communication:  Patient and wife at the bedside Disposition Plan/Expected LOS: Step down Isolation: None Nutritional Status: Acute protein calorie malnutrition related to acute pancreatitis  Consultants: Gastroenterology Cardiology General surgery  Procedures: ERCP with stent placement  12/17  Antibiotics: Unasyn 12/16 >>>  HPI/Subjective: Patient alert. Continues to complain of diffuse nonfocal his abdominal pain with distention. Minimal flatus. Still having nausea without vomiting.  Objective: Blood pressure 133/85, pulse 93, temperature 97.4 F (36.3 C), temperature source Oral, resp. rate 19, height 5\' 6"  (1.676 m), weight 166 lb 10.7 oz (75.6 kg), SpO2 94.00%.  Intake/Output Summary (Last 24 hours) at 05/12/13 0948 Last data filed at 05/12/13 0600  Gross per 24 hour  Intake 5554.17 ml  Output      0 ml  Net 5554.17 ml     Exam: General: No acute respiratory distress Lungs: Clear to auscultation bilaterally without wheezes or crackles, RA Cardiovascular: Regular rate and rhythm without murmur gallop or rub normal S1 and S2, no peripheral edema or JVD Abdomen: Diffusely tender no guarding or rebound, distended but nontender - tympanic to percussion, soft, bowel sounds positive, no rebound, no ascites, no appreciable mass Musculoskeletal: No significant cyanosis, clubbing of bilateral lower extremities Neurological: Alert and oriented x 3, moves all extremities x 4 without focal neurological deficits, CN 2-12 intact  Scheduled Meds:  Scheduled Meds: . ampicillin-sulbactam (UNASYN) IV  3 g Intravenous Q6H  . heparin subcutaneous  5,000 Units Subcutaneous Q8H  . insulin aspart  0-15 Units Subcutaneous Q4H  . metoprolol  5 mg Intravenous Q6H  . metoprolol tartrate  50 mg Oral Q8H   Continuous Infusions: . dextrose 5 % and 0.45% NaCl 1,000 mL (05/12/13 0853)    **Reviewed in detail by the Attending Physician  Data Reviewed: Basic Metabolic Panel:  Recent Labs Lab  05/09/13 1232  05/09/13 1908 05/10/13 0453 05/11/13 0315 05/11/13 1220 05/11/13 1440 05/12/13 0505  NA 136  --  135 140 147*  --  147*  K 2.9*  --  5.6* 6.0* 4.6 5.1 4.3  CL 96  --  104 110 119*  --  117*  CO2 24  --  17* 16* 17*  --  19  GLUCOSE 201*  --  201* 89 128*  --  102*  BUN 14  --  21 45* 45*  --  31*  CREATININE 1.38*  --  1.45* 2.80* 2.19*  --  1.40*  CALCIUM 9.6  --  8.3* 8.3* 7.8*  --  7.9*  MG  --  1.8  --   --   --   --   --    Liver Function Tests:  Recent Labs Lab 05/09/13 1232 05/10/13 0453 05/11/13 0315 05/12/13 0505  AST 159* 221* 105* 77*  ALT 464* 432* 226* 141*  ALKPHOS 239* 215* 153* 124*  BILITOT 6.6* 6.2* 4.1* 2.6*  PROT 8.0 7.2 6.8 6.6  ALBUMIN 4.6 3.6 3.1* 2.7*    Recent Labs Lab 05/09/13 1232 05/11/13 0315  LIPASE >3000* 1778*   No results found for this basename: AMMONIA,  in the last 168 hours CBC:  Recent Labs Lab 05/09/13 1232 05/10/13 0453 05/11/13 0315 05/11/13 1440 05/12/13 0505  WBC 34.0* 28.3* 28.1* 19.1* 16.9*  NEUTROABS 24.6*  --   --   --   --   HGB 17.5* 18.4* 15.2 13.5 13.0  HCT 49.4 52.6* 45.8 41.3 40.0  MCV 91.8 92.0 95.0 95.6 97.1  PLT 239 207 133* 90* 112*   Cardiac Enzymes: No results found for this basename: CKTOTAL, CKMB, CKMBINDEX, TROPONINI,  in the last 168 hours BNP (last 3 results) No results found for this basename: PROBNP,  in the last 8760 hours CBG:  Recent Labs Lab 05/11/13 1710 05/11/13 2016 05/12/13 0011 05/12/13 0414 05/12/13 0815  GLUCAP 88 85 92 100* 97    Recent Results (from the past 240 hour(s))  URINE CULTURE     Status: None   Collection Time    05/09/13  1:47 PM      Result Value Range Status   Specimen Description URINE, CLEAN CATCH   Final   Special Requests Normal   Final   Culture  Setup Time     Final   Value: 05/09/2013 22:24     Performed at [facility] Count     Final   Value: NO GROWTH     Performed at [facility]   Culture      Final   Value: NO GROWTH     Performed at [facility]   Report Status 05/10/2013 FINAL   Final     Studies:  Recent x-ray studies have been reviewed in detail by the Attending Physician     Junious Silk, ANP [facility] Office  [phone] Pager [phone]  **If unable to reach the above provider after paging please contact the Flow Manager @ [phone]  On-Call/Text Page:      [email or web address]      password [email or web address]  If 7PM-7AM, please contact night-coverage [email or web address] Password [email or web address] 05/12/2013, 9:48 AM   LOS: 3 days   I have examined the patient, reviewed the chart and modified the above note which I agree with.   Stepfanie Yott,MD [phone] 05/12/2013, 5:26 PM

## 2013-05-12 NOTE — Progress Notes (Signed)
Pt c/o of increased distention and difficulty breathing. SP02 98%. Respirations 21. Will continue to monitor pt.  Notified Dr.Rizwan of pt complaints.

## 2013-05-12 NOTE — Progress Notes (Signed)
Patient examined and I agree with the assessment and plan Plan lap chole once pancreatitis resolves. Nicolas Gelinas, MD, MPH, FACS Pager: [phone]  05/12/2013 3:45 PM

## 2013-05-12 NOTE — Progress Notes (Signed)
Subjective: Feeling better this AM, but his abdomen is becoming firmer.  Objective: Vital signs in last 24 hours: Temp:  [97.4 F (36.3 C)-98.5 F (36.9 C)] 98 F (36.7 C) (12/19 0415) Pulse Rate:  [97-126] 97 (12/19 0600) Resp:  [11-29] 11 (12/19 0600) BP: (128-141)/(74-93) 140/91 mmHg (12/19 0600) SpO2:  [87 %-99 %] 98 % (12/19 0600) Weight:  [166 lb 10.7 oz (75.6 kg)] 166 lb 10.7 oz (75.6 kg) (12/19 0415) Last BM Date: 05/11/13  Intake/Output from previous day: 12/18 0701 - 12/19 0700 In: 9604.5 [I.V.:4999.2; IV Piggyback:900] Out: -  Intake/Output this shift:    General appearance: alert and no distress GI: firm abdomen, no rebound or rigidity  Lab Results:  Recent Labs  05/11/13 0315 05/11/13 1440 05/12/13 0505  WBC 28.1* 19.1* 16.9*  HGB 15.2 13.5 13.0  HCT 45.8 41.3 40.0  PLT 133* 90* 112*   BMET  Recent Labs  05/11/13 0315 05/11/13 1220 05/11/13 1440 05/12/13 0505  NA 140 147*  --  147*  K 6.0* 4.6 5.1 4.3  CL 110 119*  --  117*  CO2 16* 17*  --  19  GLUCOSE 89 128*  --  102*  BUN 45* 45*  --  31*  CREATININE 2.80* 2.19*  --  1.40*  CALCIUM 8.3* 7.8*  --  7.9*   LFT  Recent Labs  05/12/13 0505  PROT 6.6  ALBUMIN 2.7*  AST 77*  ALT 141*  ALKPHOS 124*  BILITOT 2.6*   PT/INR No results found for this basename: LABPROT, INR,  in the last 72 hours Hepatitis Panel No results found for this basename: HEPBSAG, HCVAB, HEPAIGM, HEPBIGM,  in the last 72 hours C-Diff No results found for this basename: CDIFFTOX,  in the last 72 hours Fecal Lactopherrin No results found for this basename: FECLLACTOFRN,  in the last 72 hours  Studies/Results: Dg Chest 1 View  05/10/2013   CLINICAL DATA:  Fever status post ERCP  EXAM: CHEST - 1 VIEW  COMPARISON:  None.  FINDINGS: Cardiac shadow is within normal limits. The overall inspiratory effort is poor. Mild atelectatic changes are noted in the bases. No pneumothorax is seen. The upper abdomen shows gaseous  distension of the colon.  IMPRESSION: Mild bibasilar atelectatic changes.   Electronically Signed   By: Alcide Clever M.Chandler.   On: 05/10/2013 17:14   Dg Abd 1 View  05/10/2013   CLINICAL DATA:  Status post ERCP with abdominal pain  EXAM: ABDOMEN - 1 VIEW  COMPARISON:  None.  FINDINGS: Scattered large and small bowel gas is noted. There is gaseous distension of the: Likely related to the recent ERCP. Contrast material is noted within the right colon and appendix also related to the recent ERCP. A biliary stent is noted in the right upper quadrant. A small amount of biliary air is seen consistent with stent placement. No all evidence of free intraperitoneal air is seen.  IMPRESSION: Changes consistent with a recent ERCP. No acute abnormality is noted.   Electronically Signed   By: Alcide Clever M.Chandler.   On: 05/10/2013 17:15   Dg Ercp Biliary & Pancreatic Ducts  05/10/2013   CLINICAL DATA:  Biliary stent placement.  Common duct stone  EXAM: ERCP  TECHNIQUE: Multiple spot images obtained with the fluoroscopic device and submitted for interpretation post-procedure.  COMPARISON:  CT 05/09/2013  FINDINGS: Three digital images were obtained. There is partial opacification of the bile ducts with contrast. Question small common duct stones. Biliary  tree not completely evaluated for stones. No obstruction  Plastic common bile duct stent placed in good position.  IMPRESSION: Common bile duct stent placed at ERCP.  These images were submitted for radiologic interpretation only. Please see the procedural report for the amount of contrast and the fluoroscopy time utilized.   Electronically Signed   By: Marlan Palau M.Chandler.   On: 05/10/2013 15:05   Dg Abd 2 Views  05/11/2013   CLINICAL DATA:  Abdominal pain and distention.  ERCP yesterday.  EXAM: ABDOMEN - 2 VIEW  COMPARISON:  Radiograph dated 05/10/2013  FINDINGS: Common bile duct stent is in place, unchanged. There is atelectasis at both lung bases. No free air. There is air  and contrast throughout the nondistended colon, similar to the appearance on the prior study. No dilated small bowel.  IMPRESSION: No evidence of free air. No significant change since the prior exam. Slight bibasilar atelectasis.   Electronically Signed   By: Geanie Cooley M.Chandler.   On: 05/11/2013 18:17    Medications:  Scheduled: . ampicillin-sulbactam (UNASYN) IV  3 g Intravenous Q6H  . heparin subcutaneous  5,000 Units Subcutaneous Q8H  . insulin aspart  0-15 Units Subcutaneous Q4H  . metoprolol  5 mg Intravenous Q6H  . metoprolol tartrate  50 mg Oral Q8H   Continuous: . dextrose 5 % and 0.45% NaCl 1,000 mL (05/12/13 0853)    Assessment/Plan: 1) Gallstone pancreatitis. 2) Renal insufficiency - improved. 3) SVT - Resolved.   I suspect the abdominal distension is secondary to third spacing.  This is not unexpected.  The repeat abdominal films were again negative for any perforation. If the patient's condition worsens a CT scan of the abdomen can be pursued.  Plan: 1) Continue with aggressive IV hydration. 2) Advance to a clear liquid diet. 3) Continue with pain control.  LOS: 3 days   Nicolas Chandler 05/12/2013, 7:45 AM

## 2013-05-12 NOTE — Progress Notes (Signed)
Patient ID: Nicolas Chandler, male   DOB: 11/16/1965, 47 y.o.   MRN: [id number]    Subjective:  No cardiac symptoms  Abdominal pain   Objective:  Filed Vitals:   05/12/13 0012 05/12/13 0415 05/12/13 0600 05/12/13 0811  BP: 137/91 141/93 140/91 133/85  Pulse: 97 105 97 93  Temp: 97.8 F (36.6 C) 98 F (36.7 C)  97.4 F (36.3 C)  TempSrc: Oral Oral  Oral  Resp: 18 16 11 19   Height:      Weight:  166 lb 10.7 oz (75.6 kg)    SpO2: 97% 95% 98% 94%    Intake/Output from previous day:  Intake/Output Summary (Last 24 hours) at 05/12/13 0830 Last data filed at 05/12/13 0600  Gross per 24 hour  Intake 5704.17 ml  Output      0 ml  Net 5704.17 ml    Physical Exam: Affect appropriate Ill Asian male  HEENT: normal Neck supple with no adenopathy JVP normal no bruits no thyromegaly Lungs clear with no wheezing and good diaphragmatic motion Heart:  S1/S2 no murmur, no rub, gallop or click PMI normal Abdomen: no tympany epigastric pain  no bruit.  No HSM or HJR Distal pulses intact with no bruits No edema Neuro non-focal Skin warm and dry No muscular weakness   Lab Results: Basic Metabolic Panel:  Recent Labs  46/96/29 1232 05/09/13 1908  05/11/13 1220 05/11/13 1440 05/12/13 0505  NA 136  --   < > 147*  --  147*  K 2.9*  --   < > 4.6 5.1 4.3  CL 96  --   < > 119*  --  117*  CO2 24  --   < > 17*  --  19  GLUCOSE 201*  --   < > 128*  --  102*  BUN 14  --   < > 45*  --  31*  CREATININE 1.38*  --   < > 2.19*  --  1.40*  CALCIUM 9.6  --   < > 7.8*  --  7.9*  MG  --  1.8  --   --   --   --   < > = values in this interval not displayed. Liver Function Tests:  Recent Labs  05/11/13 0315 05/12/13 0505  AST 105* 77*  ALT 226* 141*  ALKPHOS 153* 124*  BILITOT 4.1* 2.6*  PROT 6.8 6.6  ALBUMIN 3.1* 2.7*    Recent Labs  05/09/13 1232 05/11/13 0315  LIPASE >3000* 1778*   CBC:  Recent Labs  05/09/13 1232  05/11/13 1440 05/12/13 0505  WBC 34.0*  < > 19.1* 16.9*    NEUTROABS 24.6*  --   --   --   HGB 17.5*  < > 13.5 13.0  HCT 49.4  < > 41.3 40.0  MCV 91.8  < > 95.6 97.1  PLT 239  < > 90* 112*  < > = values in this interval not displayed.  Imaging: Dg Chest 1 View  05/10/2013   CLINICAL DATA:  Fever status post ERCP  EXAM: CHEST - 1 VIEW  COMPARISON:  None.  FINDINGS: Cardiac shadow is within normal limits. The overall inspiratory effort is poor. Mild atelectatic changes are noted in the bases. No pneumothorax is seen. The upper abdomen shows gaseous distension of the colon.  IMPRESSION: Mild bibasilar atelectatic changes.   Electronically Signed   By: Alcide Clever M.D.   On: 05/10/2013 17:14   Dg Abd 1 View  05/10/2013   CLINICAL DATA:  Status post ERCP with abdominal pain  EXAM: ABDOMEN - 1 VIEW  COMPARISON:  None.  FINDINGS: Scattered large and small bowel gas is noted. There is gaseous distension of the: Likely related to the recent ERCP. Contrast material is noted within the right colon and appendix also related to the recent ERCP. A biliary stent is noted in the right upper quadrant. A small amount of biliary air is seen consistent with stent placement. No all evidence of free intraperitoneal air is seen.  IMPRESSION: Changes consistent with a recent ERCP. No acute abnormality is noted.   Electronically Signed   By: Alcide Clever M.D.   On: 05/10/2013 17:15   Dg Chest Port 1 View  05/12/2013   CLINICAL DATA:  Hypoxia.  EXAM: PORTABLE CHEST - 1 VIEW  COMPARISON:  05/10/2013.  FINDINGS: Poor inspiration. Bibasilar atelectasis. Heart size normal. Pulmonary vascularity normal. No pleural effusion or pneumothorax. No acute osseous abnormality.  IMPRESSION: Poor inspiration with bibasilar atelectasis.   Electronically Signed   By: Maisie Fus  Register   On: 05/12/2013 08:15   Dg Ercp Biliary & Pancreatic Ducts  05/10/2013   CLINICAL DATA:  Biliary stent placement.  Common duct stone  EXAM: ERCP  TECHNIQUE: Multiple spot images obtained with the fluoroscopic  device and submitted for interpretation post-procedure.  COMPARISON:  CT 05/09/2013  FINDINGS: Three digital images were obtained. There is partial opacification of the bile ducts with contrast. Question small common duct stones. Biliary tree not completely evaluated for stones. No obstruction  Plastic common bile duct stent placed in good position.  IMPRESSION: Common bile duct stent placed at ERCP.  These images were submitted for radiologic interpretation only. Please see the procedural report for the amount of contrast and the fluoroscopy time utilized.   Electronically Signed   By: Marlan Palau M.D.   On: 05/10/2013 15:05   Dg Abd 2 Views  05/11/2013   CLINICAL DATA:  Abdominal pain and distention.  ERCP yesterday.  EXAM: ABDOMEN - 2 VIEW  COMPARISON:  Radiograph dated 05/10/2013  FINDINGS: Common bile duct stent is in place, unchanged. There is atelectasis at both lung bases. No free air. There is air and contrast throughout the nondistended colon, similar to the appearance on the prior study. No dilated small bowel.  IMPRESSION: No evidence of free air. No significant change since the prior exam. Slight bibasilar atelectasis.   Electronically Signed   By: Geanie Cooley M.D.   On: 05/11/2013 18:17    Cardiac Studies:  ECG:  Sinus tachycardia othewise normal    Telemetry:  Sinus tachycardia rate 120-128  Echo:   Medications:   . ampicillin-sulbactam (UNASYN) IV  3 g Intravenous Q6H  . heparin subcutaneous  5,000 Units Subcutaneous Q8H  . insulin aspart  0-15 Units Subcutaneous Q4H  . metoprolol  5 mg Intravenous Q6H  . metoprolol tartrate  50 mg Oral Q8H     . dextrose 5 % and 0.45% NaCl      Assessment/Plan:  Tachycardia:  Physiologic improved on oral beta blocker  Echo TTE ordered today Pancreatitis:  Cr improved with volume KUB with no free air  Still very sick  No murmur on exam.  Continue antibiotic coverage   Charlton Haws 05/12/2013, 8:30 AM

## 2013-05-12 NOTE — Progress Notes (Signed)
  Echocardiogram 2D Echocardiogram has been performed.  Nestor Ramp M 05/12/2013, 10:33 AM

## 2013-05-13 ENCOUNTER — Inpatient Hospital Stay (HOSPITAL_COMMUNITY): Payer: BC Managed Care – PPO

## 2013-05-13 DIAGNOSIS — K859 Acute pancreatitis without necrosis or infection, unspecified: Secondary | ICD-10-CM

## 2013-05-13 DIAGNOSIS — N179 Acute kidney failure, unspecified: Secondary | ICD-10-CM

## 2013-05-13 DIAGNOSIS — R109 Unspecified abdominal pain: Secondary | ICD-10-CM

## 2013-05-13 DIAGNOSIS — K8050 Calculus of bile duct without cholangitis or cholecystitis without obstruction: Secondary | ICD-10-CM

## 2013-05-13 LAB — COMPREHENSIVE METABOLIC PANEL
ALT: 104 U/L — ABNORMAL HIGH (ref 0–53)
BUN: 21 mg/dL (ref 6–23)
CO2: 26 mEq/L (ref 19–32)
Calcium: 7.8 mg/dL — ABNORMAL LOW (ref 8.4–10.5)
Creatinine, Ser: 1.18 mg/dL (ref 0.50–1.35)
GFR calc Af Amer: 85 mL/min — ABNORMAL LOW (ref >90)
GFR calc non Af Amer: 73 mL/min — ABNORMAL LOW (ref >90)
Glucose, Bld: 145 mg/dL — ABNORMAL HIGH (ref 70–99)
Sodium: 143 mEq/L (ref 135–145)
Total Protein: 6.4 g/dL (ref 6.0–8.3)

## 2013-05-13 LAB — CBC
HCT: 37.6 % — ABNORMAL LOW (ref 39.0–52.0)
Hemoglobin: 12 g/dL — ABNORMAL LOW (ref 13.0–17.0)
MCH: 31.2 pg (ref 26.0–34.0)
MCHC: 31.9 g/dL (ref 30.0–36.0)
MCV: 97.7 fL (ref 78.0–100.0)
RBC: 3.85 MIL/uL — ABNORMAL LOW (ref 4.22–5.81)

## 2013-05-13 LAB — LIPASE, BLOOD: Lipase: 148 U/L — ABNORMAL HIGH (ref 11–59)

## 2013-05-13 LAB — GLUCOSE, CAPILLARY
Glucose-Capillary: 101 mg/dL — ABNORMAL HIGH (ref 70–99)
Glucose-Capillary: 79 mg/dL (ref 70–99)
Glucose-Capillary: 81 mg/dL (ref 70–99)

## 2013-05-13 MED ORDER — IOHEXOL 300 MG/ML  SOLN
25.0000 mL | INTRAMUSCULAR | Status: AC
  Administered 2013-05-13 (×2): 25 mL via ORAL

## 2013-05-13 MED ORDER — FUROSEMIDE 10 MG/ML IJ SOLN
20.0000 mg | Freq: Two times a day (BID) | INTRAMUSCULAR | Status: DC
  Administered 2013-05-13 – 2013-05-14 (×3): 20 mg via INTRAVENOUS
  Filled 2013-05-13 (×4): qty 2

## 2013-05-13 MED ORDER — IOHEXOL 300 MG/ML  SOLN
100.0000 mL | Freq: Once | INTRAMUSCULAR | Status: AC | PRN
  Administered 2013-05-13: 100 mL via INTRAVENOUS

## 2013-05-13 MED ORDER — HYDROMORPHONE HCL PF 1 MG/ML IJ SOLN
2.0000 mg | Freq: Once | INTRAMUSCULAR | Status: AC
Start: 2013-05-13 — End: 2013-05-14
  Administered 2013-05-14: 2 mg via INTRAVENOUS
  Filled 2013-05-13: qty 2

## 2013-05-13 NOTE — Progress Notes (Addendum)
[facility] Progress Note [place] TEAM 1 - Stepdown ICU Team    Mana Morison RUE:[id number] DOB: [birth year] DOA: 05/09/2013 PCP: [name], MD  Brief narrative: 48 year old patient with known history of diabetes hypertension and dyslipidemia. Presented to the hospital with a three-week history of abdominal pain. This pain worsened 24 hours prior to presentation and was associated with nausea and vomiting. The patient characterizes the pain as being quite severe. States he never had this type of discomfort before. Nothing improves the pain but nothing made it worse. He denied issues with fevers or chills chest pain or shortness of breath prior to presentation. Evaluation in the ER revealed elevated lipase and leukocytosis. Additional diagnostic evaluation revealed choledocholithiasis. Gastroenterology was consulted.  Assessment/Plan: Abd pain due to pancreatitis due to choledocholithiasis  - current pain seems to be related to abdominal distension- using Dilaudid frequently and asking for increase in dose - suspect much of this distension is ascites from third spacing and possibly ileus- will obtain a CT abdomen today -tolerating clears -GI following - s/p ERCP w/stent placement -cont Unasyn for now since underwent instrumentation of biliary tree increased risk for bacterial translocation -Surgery consulted-  eventual cholecystectomy after current issues resolve  Respiratory distress  - due to distended abdomen-  - start Low dose Lasix.   Sepsis with biliary pancreatitis  -Tachycardia with leukocytosis, fevers and leukocytosis improving -blood cultures NGTD  Hypokalemia  -Monitor and replace as needed  DM  -Holding PO meds -cont SSI  AKI /hypernatremia/Dehydration -stop IVF now due to severity of third spacing  -BUN/Cr now normalized  HTN  -cont B Blocker for now  HLD  -Holding PO meds  Sinus tach  -Likely physiologic secondary to sepsis per  above -Cardiology following -ECHO normal  DVT prophylaxis: Subcutaneous heparin Code Status: Full Family Communication: Patient and wife at the bedside Disposition Plan/Expected LOS: Step down Isolation: None Nutritional Status: Acute protein calorie malnutrition related to acute pancreatitis  Consultants: Gastroenterology Cardiology General surgery  Procedures: ERCP with stent placement  12/17  Antibiotics: Unasyn 12/16 >>>  HPI/Subjective: Patient alert. Having trouble taking a deep breath. Abdomen quite distended.   Objective: Blood pressure 129/94, pulse 90, temperature 97.4 F (36.3 C), temperature source Oral, resp. rate 25, height 5\' 6"  (1.676 m), weight 75.6 kg (166 lb 10.7 oz), SpO2 97.00%.  Intake/Output Summary (Last 24 hours) at 05/13/13 0858 Last data filed at 05/13/13 0800  Gross per 24 hour  Intake 5023.33 ml  Output      0 ml  Net 5023.33 ml     Exam: General: rapid shallow breathing Lungs: Clear to auscultation bilaterally without wheezes or crackles, RA Cardiovascular: Regular rate and rhythm without murmur gallop or rub normal S1 and S2, no peripheral edema or JVD Abdomen: significantly distended -dull to percussion today, bowel sounds positive, no rebound, no ascites, no appreciable mass Musculoskeletal: No significant cyanosis, clubbing of bilateral lower extremities Neurological: Alert and oriented x 3, moves all extremities x 4 without focal neurological deficits, CN 2-12 intact  Scheduled Meds:  Scheduled Meds: . ampicillin-sulbactam (UNASYN) IV  3 g Intravenous Q6H  . heparin subcutaneous  5,000 Units Subcutaneous Q8H  . insulin aspart  0-15 Units Subcutaneous Q4H  . metoprolol  5 mg Intravenous Q6H  . metoprolol tartrate  50 mg Oral Q8H   Continuous Infusions: . dextrose 5 % and 0.45% NaCl 100 mL/hr at 05/13/13 1478    **Reviewed in detail by the Attending Physician  Data Reviewed: Basic Metabolic Panel:  Recent Labs Lab  05/09/13 1232 05/09/13 1908 05/10/13 0453 05/11/13 0315 05/11/13 1220 05/11/13 1440 05/12/13 0505 05/13/13 0335  NA 136  --  135 140 147*  --  147* 143  K 2.9*  --  5.6* 6.0* 4.6 5.1 4.3 4.4  CL 96  --  104 110 119*  --  117* 111  CO2 24  --  17* 16* 17*  --  19 26  GLUCOSE 201*  --  201* 89 128*  --  102* 145*  BUN 14  --  21 45* 45*  --  31* 21  CREATININE 1.38*  --  1.45* 2.80* 2.19*  --  1.40* 1.18  CALCIUM 9.6  --  8.3* 8.3* 7.8*  --  7.9* 7.8*  MG  --  1.8  --   --   --   --   --   --    Liver Function Tests:  Recent Labs Lab 05/09/13 1232 05/10/13 0453 05/11/13 0315 05/12/13 0505 05/13/13 0335  AST 159* 221* 105* 77* 42*  ALT 464* 432* 226* 141* 104*  ALKPHOS 239* 215* 153* 124* 115  BILITOT 6.6* 6.2* 4.1* 2.6* 1.8*  PROT 8.0 7.2 6.8 6.6 6.4  ALBUMIN 4.6 3.6 3.1* 2.7* 2.7*    Recent Labs Lab 05/09/13 1232 05/11/13 0315 05/12/13 0505 05/13/13 0335  LIPASE >3000* 1778* 732* 148*   No results found for this basename: AMMONIA,  in the last 168 hours CBC:  Recent Labs Lab 05/09/13 1232 05/10/13 0453 05/11/13 0315 05/11/13 1440 05/12/13 0505 05/13/13 0335  WBC 34.0* 28.3* 28.1* 19.1* 16.9* 13.8*  NEUTROABS 24.6*  --   --   --   --   --   HGB 17.5* 18.4* 15.2 13.5 13.0 12.0*  HCT 49.4 52.6* 45.8 41.3 40.0 37.6*  MCV 91.8 92.0 95.0 95.6 97.1 97.7  PLT 239 207 133* 90* 112* 120*   Cardiac Enzymes: No results found for this basename: CKTOTAL, CKMB, CKMBINDEX, TROPONINI,  in the last 168 hours BNP (last 3 results) No results found for this basename: PROBNP,  in the last 8760 hours CBG:  Recent Labs Lab 05/12/13 1233 05/12/13 1644 05/12/13 1937 05/12/13 2346 05/13/13 0431  GLUCAP 150* 215* 136* 81 162*    Recent Results (from the past 240 hour(s))  URINE CULTURE     Status: None   Collection Time    05/09/13  1:47 PM      Result Value Range Status   Specimen Description URINE, CLEAN CATCH   Final   Special Requests Normal   Final    Culture  Setup Time     Final   Value: 05/09/2013 22:24     Performed at [facility] Count     Final   Value: NO GROWTH     Performed at [facility]   Culture     Final   Value: NO GROWTH     Performed at [facility]   Report Status 05/10/2013 FINAL   Final  CULTURE, BLOOD (ROUTINE X 2)     Status: None   Collection Time    05/11/13  9:35 AM      Result Value Range Status   Specimen Description BLOOD RIGHT ANTECUBITAL   Final   Special Requests     Final   Value: BOTTLES DRAWN AEROBIC AND ANAEROBIC 10CC AER 5CC ANA   Culture  Setup Time     Final   Value: 05/11/2013 14:59  Performed at [facility]     Final   Value:        BLOOD CULTURE RECEIVED NO GROWTH TO DATE CULTURE WILL BE HELD FOR 5 DAYS BEFORE ISSUING A FINAL NEGATIVE REPORT     Performed at [facility]   Report Status PENDING   Incomplete  CULTURE, BLOOD (ROUTINE X 2)     Status: None   Collection Time    05/11/13  9:50 AM      Result Value Range Status   Specimen Description BLOOD WRIST RIGHT   Final   Special Requests BOTTLES DRAWN AEROBIC AND ANAEROBIC 10CC   Final   Culture  Setup Time     Final   Value: 05/11/2013 14:59     Performed at [facility]   Culture     Final   Value:        BLOOD CULTURE RECEIVED NO GROWTH TO DATE CULTURE WILL BE HELD FOR 5 DAYS BEFORE ISSUING A FINAL NEGATIVE REPORT     Performed at [facility]   Report Status PENDING   Incomplete     Studies:  Recent x-ray studies have been reviewed in detail by the Attending Physician    Kesler Wickham,MD [phone] 05/13/2013, 8:58 AM

## 2013-05-13 NOTE — Progress Notes (Signed)
Progress Note covering for [facility]   Subjective  Pt with increasing abd distention with pain Nausea, but no vomiting Remains dyspneic  + Flatus    Objective  Vital signs in last 24 hours: Temp:  [97.4 F (36.3 C)-98.6 F (37 C)] 97.4 F (36.3 C) (12/20 0810) Pulse Rate:  [90-103] 90 (12/20 0810) Resp:  [20-35] 25 (12/20 0810) BP: (129-156)/(85-98) 129/94 mmHg (12/20 0810) SpO2:  [93 %-100 %] 97 % (12/20 0810) Last BM Date: 05/12/13 Gen: awake, alert, short sentences due to dyspnea HEENT: anicteric, op clear, nasal cannula in place CV: tachy, regular Pulm: decreased b/l bases Abd: firm and moderately distended, +hypoactive BS, diffuse mild tenderness Ext: 2+ edema Neuro: nonfocal   Intake/Output from previous day: 12/19 0701 - 12/20 0700 In: 4823.3 [I.V.:4423.3; IV Piggyback:400] Out: -  Intake/Output this shift: Total I/O In: 400 [I.V.:400] Out: -   Lab Results:  Recent Labs  05/11/13 1440 05/12/13 0505 05/13/13 0335  WBC 19.1* 16.9* 13.8*  HGB 13.5 13.0 12.0*  HCT 41.3 40.0 37.6*  PLT 90* 112* 120*   BMET  Recent Labs  05/11/13 1220 05/11/13 1440 05/12/13 0505 05/13/13 0335  NA 147*  --  147* 143  K 4.6 5.1 4.3 4.4  CL 119*  --  117* 111  CO2 17*  --  19 26  GLUCOSE 128*  --  102* 145*  BUN 45*  --  31* 21  CREATININE 2.19*  --  1.40* 1.18  CALCIUM 7.8*  --  7.9* 7.8*   LFT  Recent Labs  05/13/13 0335  PROT 6.4  ALBUMIN 2.7*  AST 42*  ALT 104*  ALKPHOS 115  BILITOT 1.8*    Studies/Results: Dg Chest Port 1 View  05/12/2013   CLINICAL DATA:  Hypoxia.  EXAM: PORTABLE CHEST - 1 VIEW  COMPARISON:  05/10/2013.  FINDINGS: Poor inspiration. Bibasilar atelectasis. Heart size normal. Pulmonary vascularity normal. No pleural effusion or pneumothorax. No acute osseous abnormality.  IMPRESSION: Poor inspiration with bibasilar atelectasis.   Electronically Signed   By: Maisie Fus  Register   On: 05/12/2013 08:15   Dg Abd 2  Views  05/11/2013   CLINICAL DATA:  Abdominal pain and distention.  ERCP yesterday.  EXAM: ABDOMEN - 2 VIEW  COMPARISON:  Radiograph dated 05/10/2013  FINDINGS: Common bile duct stent is in place, unchanged. There is atelectasis at both lung bases. No free air. There is air and contrast throughout the nondistended colon, similar to the appearance on the prior study. No dilated small bowel.  IMPRESSION: No evidence of free air. No significant change since the prior exam. Slight bibasilar atelectasis.   Electronically Signed   By: Geanie Cooley Chandler.D.   On: 05/11/2013 18:17      Assessment & Plan  47 yo male with gallstone pancreatitis, s/p ERCP with stent placement  1.  Gallstone pancreatitis -- continued abd distention, third spacing likely, but ileus also very possible. CT abd/pelvis today (ordered by primary team) to rule out further complication.  Vol overload at present, IVFs stopped and IV lasix ordered.  Renal function is improving.  Not tolerating much by mouth.  Nutrition is an issue.  Await CT findings, if no ileus, make need enteral feedings --CT abd/pelvis --pain control --Improving renal function, getting lasix for vol overload --Nutrition limited, consider enteral feeds if CT unrevealing --WBC count improving --Not ready for lap chole    Active Problems:   Choledocholithiasis   Abdominal pain   Leukocytosis  Hypokalemia   AKI (acute kidney injury)   DM (diabetes mellitus)   HTN (hypertension)   HLD (hyperlipidemia)   Pancreatitis   Elevated LFTs   Hepatic steatosis   Renal cyst   Thrombocytopenia, unspecified     LOS: 4 days   Nicolas Chandler  05/13/2013, 10:19 AM

## 2013-05-13 NOTE — Progress Notes (Signed)
Patient Name: Nicolas Chandler Date of Encounter: 05/13/2013     Active Problems:   Choledocholithiasis   Abdominal pain   Leukocytosis   Hypokalemia   AKI (acute kidney injury)   DM (diabetes mellitus)   HTN (hypertension)   HLD (hyperlipidemia)   Pancreatitis   Elevated LFTs   Hepatic steatosis   Renal cyst   Thrombocytopenia, unspecified    SUBJECTIVE  No further tachycardia. No chest pain. Still moderate abdominal pain and distention.  CURRENT MEDS . ampicillin-sulbactam (UNASYN) IV  3 g Intravenous Q6H  . furosemide  20 mg Intravenous Q12H  . heparin subcutaneous  5,000 Units Subcutaneous Q8H  . insulin aspart  0-15 Units Subcutaneous Q4H  . metoprolol  5 mg Intravenous Q6H  . metoprolol tartrate  50 mg Oral Q8H    OBJECTIVE  Filed Vitals:   05/12/13 2347 05/13/13 0433 05/13/13 0810 05/13/13 1143  BP: 152/92 154/88 129/94 147/98  Pulse: 92 100 90 89  Temp: 98 F (36.7 C) 98.6 F (37 C) 97.4 F (36.3 C) 97.7 F (36.5 C)  TempSrc: Oral Oral Oral Oral  Resp: 20 35 25 17  Height:      Weight:      SpO2: 100% 96% 97% 97%    Intake/Output Summary (Last 24 hours) at 05/13/13 1207 Last data filed at 05/13/13 0800  Gross per 24 hour  Intake 4923.33 ml  Output      0 ml  Net 4923.33 ml   Filed Weights   05/09/13 1700 05/10/13 1525 05/12/13 0415  Weight: 157 lb 6.5 oz (71.4 kg) 160 lb 15 oz (73 kg) 166 lb 10.7 oz (75.6 kg)    PHYSICAL EXAM  General: Pleasant, NAD. Neuro: Alert and oriented X 3. Moves all extremities spontaneously. Psych: Normal affect. HEENT:  Normal  Neck: Supple without bruits or JVD. Lungs:  Resp regular and unlabored, CTA. Heart: RRR no s3, s4, or murmurs. Abdomen: Moderate abdominal distention. Extremities: Trace pretibial edema.  Accessory Clinical Findings  CBC  Recent Labs  05/12/13 0505 05/13/13 0335  WBC 16.9* 13.8*  HGB 13.0 12.0*  HCT 40.0 37.6*  MCV 97.1 97.7  PLT 112* 120*   Basic Metabolic  Panel  Recent Labs  96/04/54 0505 05/13/13 0335  NA 147* 143  K 4.3 4.4  CL 117* 111  CO2 19 26  GLUCOSE 102* 145*  BUN 31* 21  CREATININE 1.40* 1.18  CALCIUM 7.9* 7.8*   Liver Function Tests  Recent Labs  05/12/13 0505 05/13/13 0335  AST 77* 42*  ALT 141* 104*  ALKPHOS 124* 115  BILITOT 2.6* 1.8*  PROT 6.6 6.4  ALBUMIN 2.7* 2.7*    Recent Labs  05/12/13 0505 05/13/13 0335  LIPASE 732* 148*   Cardiac Enzymes No results found for this basename: CKTOTAL, CKMB, CKMBINDEX, TROPONINI,  in the last 72 hours BNP No components found with this basename: POCBNP,  D-Dimer No results found for this basename: DDIMER,  in the last 72 hours Hemoglobin A1C No results found for this basename: HGBA1C,  in the last 72 hours Fasting Lipid Panel No results found for this basename: CHOL, HDL, LDLCALC, TRIG, CHOLHDL, LDLDIRECT,  in the last 72 hours Thyroid Function Tests No results found for this basename: TSH, T4TOTAL, FREET3, T3FREE, THYROIDAB,  in the last 72 hours  TELE  NSR  ECG    Radiology/Studies  Dg Chest 1 View  05/10/2013   CLINICAL DATA:  Fever status post ERCP  EXAM: CHEST -  1 VIEW  COMPARISON:  None.  FINDINGS: Cardiac shadow is within normal limits. The overall inspiratory effort is poor. Mild atelectatic changes are noted in the bases. No pneumothorax is seen. The upper abdomen shows gaseous distension of the colon.  IMPRESSION: Mild bibasilar atelectatic changes.   Electronically Signed   By: Alcide Clever M.D.   On: 05/10/2013 17:14   Dg Abd 1 View  05/10/2013   CLINICAL DATA:  Status post ERCP with abdominal pain  EXAM: ABDOMEN - 1 VIEW  COMPARISON:  None.  FINDINGS: Scattered large and small bowel gas is noted. There is gaseous distension of the: Likely related to the recent ERCP. Contrast material is noted within the right colon and appendix also related to the recent ERCP. A biliary stent is noted in the right upper quadrant. A small amount of biliary  air is seen consistent with stent placement. No all evidence of free intraperitoneal air is seen.  IMPRESSION: Changes consistent with a recent ERCP. No acute abnormality is noted.   Electronically Signed   By: Alcide Clever M.D.   On: 05/10/2013 17:15   US Abdomen Complete  05/09/2013   CLINICAL DATA:  Right upper quadrant pain.  EXAM: ULTRASOUND ABDOMEN COMPLETE  COMPARISON:  None.  FINDINGS: Gallbladder:  There are multiple echogenic stones in the gallbladder. Gallbladder wall is mildly thickened, measuring 0.3 cm. There is hypoechoic material in the right upper quadrant suggestive for complex fluid. The complex fluid extends into the right flank region. There is simple appearing fluid around the gallbladder. Reportedly, the patient does not have a sonographic Murphy's sign but the patient has received pain medication.  Common bile duct:  Diameter: 0.5 cm  Liver:  Increased echogenicity of the liver suggests hepatic steatosis. The liver parenchyma is also heterogeneous with loss of the internal architecture.  IVC:  Not well seen.  Pancreas:  Not well seen.  Spleen:  Size and appearance within normal limits. Spleen measures 6.4 cm in length.  Right Kidney:  Length: 10.5 cm. Echogenicity within normal limits. No mass or hydronephrosis visualized.  Left Kidney:  Length: 10.8 cm. Echogenicity within normal limits. No mass or hydronephrosis visualized.  Abdominal aorta:  No aneurysm visualized.  Other findings:  None.  IMPRESSION: Cholelithiasis with mild gallbladder wall thickening. There is complex fluid in the right upper quadrant suggesting an acute inflammatory process.  Hepatic steatosis.   Electronically Signed   By: Richarda Overlie M.D.   On: 05/09/2013 15:25   Ct Abdomen Pelvis W Contrast  05/09/2013   CLINICAL DATA:  Abdominal pain.  EXAM: CT ABDOMEN AND PELVIS WITH CONTRAST  TECHNIQUE: Multidetector CT imaging of the abdomen and pelvis was performed using the standard protocol following bolus  administration of intravenous contrast.  CONTRAST:  80mL OMNIPAQUE IOHEXOL 300 MG/ML  SOLN  COMPARISON:  Ultrasound 05/09/2013  FINDINGS: Lung bases are clear.  There is no evidence for free air.  Decreased attenuation of the liver is consistent with hepatic steatosis. There is a small amount of fluid along the right hepatic dome. There is fluid around the inferior gallbladder and the duodenum. There are multiple gallstones. The gallbladder wall is dense and could represent hyperemia. Portal venous system is patent. There is marked inflammation involving the descending duodenum. There is wall thickening along the medial duodenum wall, measuring up to 1.6 cm. Multiple calcifications in the distal common bile duct are suggestive for choledocholithiasis. There is no significant common bile duct dilatation. The base of the gallbladder  or the cystic duct is distended, measuring up to 1.5 cm, and containing multiple stones. There is fluid or edema in the porta hepatitis. There is fluid and edema tracking around the pancreas. Fluid extends into the left upper quadrant and adjacent to the stomach and spleen. No significant pancreatic duct dilatation. Normal appearance of the spleen.  There is a 1.7 cm low-density structure along the right kidney upper pole. The Hounsfield units roughly measure 39 and not consistent with a simple cyst. However, the Hounsfield units do not significantly change on the delayed images and probably represents a proteinaceous or hemorrhagic cyst. There is a small cyst along the medial right kidney. Question a punctate stone in the right kidney without hydronephrosis. There are two nonobstructive left kidney stones.  No gross abnormality to the prostate, seminal vesicles or urinary bladder. No significant lymphadenopathy. No acute inflammation involving the appendix. Normal appearance of the jejunum and ileum. No gross abnormality to the colon.  Bilateral pars defects at L5. No significant  anterolisthesis at L5-S1.  IMPRESSION: There is extensive fluid and inflammation in the right upper quadrant, centered around the duodenum. There is marked asymmetric duodenal wall thickening with stones in the distal common bile duct. Evidence for pancreatitis.  Multiple gallstones and cannot exclude cholecystitis.  Decreased attenuation of the liver suggests hepatic steatosis.  Nonobstructive kidney stones.  1.7 cm indeterminate right renal structure. This probably represents a hyperdense or proteinaceous renal cyst but indeterminate. Consider follow-up ultrasound after the acute process has resolved. This area was not well evaluated on today's ultrasound.  These results were called by telephone at the time of interpretation on 05/09/2013 at 3:43 PM to Dr. Emilia Beck , who verbally acknowledged these results.   Electronically Signed   By: Richarda Overlie M.D.   On: 05/09/2013 15:47   Dg Chest Port 1 View  05/12/2013   CLINICAL DATA:  Hypoxia.  EXAM: PORTABLE CHEST - 1 VIEW  COMPARISON:  05/10/2013.  FINDINGS: Poor inspiration. Bibasilar atelectasis. Heart size normal. Pulmonary vascularity normal. No pleural effusion or pneumothorax. No acute osseous abnormality.  IMPRESSION: Poor inspiration with bibasilar atelectasis.   Electronically Signed   By: Maisie Fus  Register   On: 05/12/2013 08:15   Dg Ercp Biliary & Pancreatic Ducts  05/10/2013   CLINICAL DATA:  Biliary stent placement.  Common duct stone  EXAM: ERCP  TECHNIQUE: Multiple spot images obtained with the fluoroscopic device and submitted for interpretation post-procedure.  COMPARISON:  CT 05/09/2013  FINDINGS: Three digital images were obtained. There is partial opacification of the bile ducts with contrast. Question small common duct stones. Biliary tree not completely evaluated for stones. No obstruction  Plastic common bile duct stent placed in good position.  IMPRESSION: Common bile duct stent placed at ERCP.  These images were submitted for  radiologic interpretation only. Please see the procedural report for the amount of contrast and the fluoroscopy time utilized.   Electronically Signed   By: Marlan Palau M.D.   On: 05/10/2013 15:05   Dg Abd 2 Views  05/11/2013   CLINICAL DATA:  Abdominal pain and distention.  ERCP yesterday.  EXAM: ABDOMEN - 2 VIEW  COMPARISON:  Radiograph dated 05/10/2013  FINDINGS: Common bile duct stent is in place, unchanged. There is atelectasis at both lung bases. No free air. There is air and contrast throughout the nondistended colon, similar to the appearance on the prior study. No dilated small bowel.  IMPRESSION: No evidence of free air. No significant change since  the prior exam. Slight bibasilar atelectasis.   Electronically Signed   By: Geanie Cooley M.D.   On: 05/11/2013 18:17   2D echo: - Left ventricle: The cavity size was normal. Wall thickness was normal. The estimated ejection fraction was 65%. Wall motion was normal; there were no regional wall motion abnormalities. - Right ventricle: The cavity size was normal. Systolic function was normal.  ASSESSMENT AND PLAN 1. Gallstone pancreatitis 2. SVT resolved. Tolerating oral metoprolol.  Plan: Continue current cardiac meds.  Signed, Cassell Clement  MD

## 2013-05-13 NOTE — Progress Notes (Addendum)
ANTIBIOTIC CONSULT NOTE - INITIAL  Pharmacy Consult for Imipenem Indication: pancreatitis with necrosis  No Known Allergies  Patient Measurements: Height: 5\' 6"  (167.6 cm) Weight: 166 lb 10.7 oz (75.6 kg) IBW/kg (Calculated) : 63.8  Vital Signs: Temp: 97.7 F (36.5 C) (12/20 1143) Temp src: Oral (12/20 1143) BP: 162/95 mmHg (12/20 1631) Pulse Rate: 95 (12/20 1631) Intake/Output from previous day: 12/19 0701 - 12/20 0700 In: 4823.3 [I.V.:4423.3; IV Piggyback:400] Out: -  Intake/Output from this shift: Total I/O In: 500 [I.V.:400; IV Piggyback:100] Out: -   Labs:  Recent Labs  05/11/13 1220 05/11/13 1440 05/12/13 0505 05/13/13 0335  WBC  --  19.1* 16.9* 13.8*  HGB  --  13.5 13.0 12.0*  PLT  --  90* 112* 120*  CREATININE 2.19*  --  1.40* 1.18   Estimated Creatinine Clearance: 71.3 ml/min (by C-G formula based on Cr of 1.18). No results found for this basename: VANCOTROUGH, Leodis Binet, VANCORANDOM, GENTTROUGH, GENTPEAK, GENTRANDOM, TOBRATROUGH, TOBRAPEAK, TOBRARND, AMIKACINPEAK, AMIKACINTROU, AMIKACIN,  in the last 72 hours   Microbiology: Recent Results (from the past 720 hour(s))  URINE CULTURE     Status: None   Collection Time    05/09/13  1:47 PM      Result Value Range Status   Specimen Description URINE, CLEAN CATCH   Final   Special Requests Normal   Final   Culture  Setup Time     Final   Value: 05/09/2013 22:24     Performed at [facility] Count     Final   Value: NO GROWTH     Performed at [facility]   Culture     Final   Value: NO GROWTH     Performed at [facility]   Report Status 05/10/2013 FINAL   Final  CULTURE, BLOOD (ROUTINE X 2)     Status: None   Collection Time    05/11/13  9:35 AM      Result Value Range Status   Specimen Description BLOOD RIGHT ANTECUBITAL   Final   Special Requests     Final   Value: BOTTLES DRAWN AEROBIC AND ANAEROBIC 10CC AER 5CC ANA   Culture  Setup Time     Final   Value:  05/11/2013 14:59     Performed at [facility]   Culture     Final   Value:        BLOOD CULTURE RECEIVED NO GROWTH TO DATE CULTURE WILL BE HELD FOR 5 DAYS BEFORE ISSUING A FINAL NEGATIVE REPORT     Performed at [facility]   Report Status PENDING   Incomplete  CULTURE, BLOOD (ROUTINE X 2)     Status: None   Collection Time    05/11/13  9:50 AM      Result Value Range Status   Specimen Description BLOOD WRIST RIGHT   Final   Special Requests BOTTLES DRAWN AEROBIC AND ANAEROBIC 10CC   Final   Culture  Setup Time     Final   Value: 05/11/2013 14:59     Performed at [facility]   Culture     Final   Value:        BLOOD CULTURE RECEIVED NO GROWTH TO DATE CULTURE WILL BE HELD FOR 5 DAYS BEFORE ISSUING A FINAL NEGATIVE REPORT     Performed at [facility]   Report Status PENDING   Incomplete    Medical History: Past Medical  History  Diagnosis Date  . Diabetes mellitus without complication   . Hypertension   . Cholecystitis 04/2013  . Fatty liver   . Pancreatitis     Medications:  Prescriptions prior to admission  Medication Sig Dispense Refill  . glimepiride (AMARYL) 4 MG tablet Take 4 mg by mouth 2 (two) times daily.      Marland Kitchen lisinopril (PRINIVIL,ZESTRIL) 5 MG tablet Take 5 mg by mouth daily.      . metFORMIN (GLUCOPHAGE) 500 MG tablet Take 500 mg by mouth 2 (two) times daily with a meal.      . OVER THE COUNTER MEDICATION Take 1 tablet by mouth once.      . simvastatin (ZOCOR) 20 MG tablet Take 20 mg by mouth daily.       Assessment: 47 y.o. male presented with abd pain 12/16. Pt has been on Unasyn x 5 days. To broaden coverage to imipenem today per GI MD. Afeb. Wbc 13.8 -trending down. CrCl improved to ~70 ml/min. All cultures ngtd or negative.  Goal of Therapy:  Eradication of infection  Plan:  1. Imipenem 500mg  IV q6h.  2. Pharmacy will sign off and follow peripherally  Thank you. Okey Regal, PharmD [phone]

## 2013-05-13 NOTE — Progress Notes (Signed)
3 Days Post-Op  Subjective: Reports continued abdominal pain and worsening distension Is passing flatus  Objective: Vital signs in last 24 hours: Temp:  [97.4 F (36.3 C)-98.6 F (37 C)] 97.4 F (36.3 C) (12/20 0810) Pulse Rate:  [90-103] 90 (12/20 0810) Resp:  [20-35] 25 (12/20 0810) BP: (129-156)/(85-98) 129/94 mmHg (12/20 0810) SpO2:  [93 %-100 %] 97 % (12/20 0810) Last BM Date: 05/12/13  Intake/Output from previous day: 12/19 0701 - 12/20 0700 In: 4823.3 [I.V.:4423.3; IV Piggyback:400] Out: -  Intake/Output this shift: Total I/O In: 400 [I.V.:400] Out: -   Uncomfortable in appearance Abdomen distended, full and tender in the epigastrium  Lab Results:   Recent Labs  05/12/13 0505 05/13/13 0335  WBC 16.9* 13.8*  HGB 13.0 12.0*  HCT 40.0 37.6*  PLT 112* 120*   BMET  Recent Labs  05/12/13 0505 05/13/13 0335  NA 147* 143  K 4.3 4.4  CL 117* 111  CO2 19 26  GLUCOSE 102* 145*  BUN 31* 21  CREATININE 1.40* 1.18  CALCIUM 7.9* 7.8*   PT/INR No results found for this basename: LABPROT, INR,  in the last 72 hours ABG  Recent Labs  05/11/13 1040  PHART 7.241*  HCO3 18.0*    Studies/Results: Dg Chest Port 1 View  05/12/2013   CLINICAL DATA:  Hypoxia.  EXAM: PORTABLE CHEST - 1 VIEW  COMPARISON:  05/10/2013.  FINDINGS: Poor inspiration. Bibasilar atelectasis. Heart size normal. Pulmonary vascularity normal. No pleural effusion or pneumothorax. No acute osseous abnormality.  IMPRESSION: Poor inspiration with bibasilar atelectasis.   Electronically Signed   By: Maisie Fus  Register   On: 05/12/2013 08:15   Dg Abd 2 Views  05/11/2013   CLINICAL DATA:  Abdominal pain and distention.  ERCP yesterday.  EXAM: ABDOMEN - 2 VIEW  COMPARISON:  Radiograph dated 05/10/2013  FINDINGS: Common bile duct stent is in place, unchanged. There is atelectasis at both lung bases. No free air. There is air and contrast throughout the nondistended colon, similar to the appearance on  the prior study. No dilated small bowel.  IMPRESSION: No evidence of free air. No significant change since the prior exam. Slight bibasilar atelectasis.   Electronically Signed   By: Geanie Cooley M.D.   On: 05/11/2013 18:17    Anti-infectives: Anti-infectives   Start     Dose/Rate Route Frequency Ordered Stop   05/09/13 2200  Ampicillin-Sulbactam (UNASYN) 3 g in sodium chloride 0.9 % 100 mL IVPB     3 g 100 mL/hr over 60 Minutes Intravenous Every 6 hours 05/09/13 1733     05/09/13 1630  Ampicillin-Sulbactam (UNASYN) 3 g in sodium chloride 0.9 % 100 mL IVPB     3 g 100 mL/hr over 60 Minutes Intravenous  Once 05/09/13 1605 05/09/13 1721      Assessment/Plan: s/p Procedure(s): ENDOSCOPIC RETROGRADE CHOLANGIOPANCREATOGRAPHY (ERCP) (N/A)  Pancreatitis  Labs improving, but clinically patient is still ill.  If distension progresses, will need an NG tube Not ready for lap chole  LOS: 4 days    [employer] A 05/13/2013

## 2013-05-14 ENCOUNTER — Inpatient Hospital Stay (HOSPITAL_COMMUNITY): Payer: BC Managed Care – PPO

## 2013-05-14 LAB — COMPREHENSIVE METABOLIC PANEL
ALT: 79 U/L — ABNORMAL HIGH (ref 0–53)
AST: 30 U/L (ref 0–37)
Albumin: 2.8 g/dL — ABNORMAL LOW (ref 3.5–5.2)
CO2: 31 mEq/L (ref 19–32)
Calcium: 8.1 mg/dL — ABNORMAL LOW (ref 8.4–10.5)
Chloride: 103 mEq/L (ref 96–112)
GFR calc non Af Amer: 79 mL/min — ABNORMAL LOW (ref >90)
Sodium: 145 mEq/L (ref 135–145)
Total Bilirubin: 1.8 mg/dL — ABNORMAL HIGH (ref 0.3–1.2)

## 2013-05-14 LAB — GLUCOSE, CAPILLARY
Glucose-Capillary: 124 mg/dL — ABNORMAL HIGH (ref 70–99)
Glucose-Capillary: 153 mg/dL — ABNORMAL HIGH (ref 70–99)
Glucose-Capillary: 114 mg/dL — ABNORMAL HIGH (ref 70–99)

## 2013-05-14 LAB — CBC
Platelets: 141 10*3/uL — ABNORMAL LOW (ref 150–400)
RBC: 4.11 MIL/uL — ABNORMAL LOW (ref 4.22–5.81)
RDW: 14 % (ref 11.5–15.5)
WBC: 15.5 10*3/uL — ABNORMAL HIGH (ref 4.0–10.5)

## 2013-05-14 MED ORDER — SODIUM CHLORIDE 0.9 % IV SOLN
500.0000 mg | Freq: Four times a day (QID) | INTRAVENOUS | Status: DC
  Administered 2013-05-14 – 2013-05-15 (×6): 500 mg via INTRAVENOUS
  Filled 2013-05-14 (×8): qty 500

## 2013-05-14 MED ORDER — KETOROLAC TROMETHAMINE 15 MG/ML IJ SOLN
15.0000 mg | Freq: Four times a day (QID) | INTRAMUSCULAR | Status: DC
  Administered 2013-05-14 – 2013-05-16 (×8): 15 mg via INTRAVENOUS
  Filled 2013-05-14 (×12): qty 1

## 2013-05-14 MED ORDER — PHENOL 1.4 % MT LIQD
1.0000 | OROMUCOSAL | Status: DC | PRN
  Administered 2013-05-14: 1 via OROMUCOSAL
  Filled 2013-05-14: qty 177

## 2013-05-14 MED ORDER — FAMOTIDINE IN NACL 20-0.9 MG/50ML-% IV SOLN
20.0000 mg | Freq: Two times a day (BID) | INTRAVENOUS | Status: DC
  Administered 2013-05-14 – 2013-05-19 (×10): 20 mg via INTRAVENOUS
  Filled 2013-05-14 (×13): qty 50

## 2013-05-14 MED ORDER — SODIUM CHLORIDE 0.45 % IV SOLN
INTRAVENOUS | Status: DC
  Administered 2013-05-14: 14:00:00 via INTRAVENOUS
  Administered 2013-05-15: 500 mL via INTRAVENOUS

## 2013-05-14 NOTE — Progress Notes (Signed)
Patient Name: Nicolas Chandler Date of Encounter: 05/14/2013     Active Problems:   Choledocholithiasis   Abdominal pain   Leukocytosis   Hypokalemia   AKI (acute kidney injury)   DM (diabetes mellitus)   HTN (hypertension)   HLD (hyperlipidemia)   Pancreatitis   Elevated LFTs   Hepatic steatosis   Renal cyst   Thrombocytopenia, unspecified    SUBJECTIVE  No new cardiac problems.  Rhythm is normal sinus rhythm.  He is still on IV metoprolol until able to take oral medicines reliably.  CURRENT MEDS . famotidine (PEPCID) IV  20 mg Intravenous Q12H  . heparin subcutaneous  5,000 Units Subcutaneous Q8H  . insulin aspart  0-15 Units Subcutaneous Q4H  . ketorolac  15 mg Intravenous Q6H  . metoprolol  5 mg Intravenous Q6H    OBJECTIVE  Filed Vitals:   05/14/13 0400 05/14/13 0630 05/14/13 0800 05/14/13 1100  BP: 141/88 158/100 179/106 129/98  Pulse: 92 92 95 102  Temp: 97.9 F (36.6 C)  98.2 F (36.8 C)   TempSrc: Oral  Oral   Resp: 13 17 18 12   Height:      Weight:      SpO2: 100% 100% 100% 98%    Intake/Output Summary (Last 24 hours) at 05/14/13 1123 Last data filed at 05/14/13 0900  Gross per 24 hour  Intake    100 ml  Output   2000 ml  Net  -1900 ml   Filed Weights   05/09/13 1700 05/10/13 1525 05/12/13 0415  Weight: 157 lb 6.5 oz (71.4 kg) 160 lb 15 oz (73 kg) 166 lb 10.7 oz (75.6 kg)    PHYSICAL EXAM  General: Pleasant, NAD. Neuro: Alert and oriented X 3. Moves all extremities spontaneously. Psych: Normal affect. HEENT:  Normal  Neck: Supple without bruits or JVD. Lungs:  Resp regular and unlabored, CTA. Heart: RRR no s3, s4, or murmurs. Abdomen: Mildly distended Extremities: Trace pedal edema  Accessory Clinical Findings  CBC  Recent Labs  05/13/13 0335 05/14/13 0350  WBC 13.8* 15.5*  HGB 12.0* 12.5*  HCT 37.6* 39.5  MCV 97.7 96.1  PLT 120* 141*   Basic Metabolic Panel  Recent Labs  05/13/13 0335 05/14/13 0350  NA 143 145  K  4.4 3.5  CL 111 103  CO2 26 31  GLUCOSE 145* 128*  BUN 21 22  CREATININE 1.18 1.10  CALCIUM 7.8* 8.1*   Liver Function Tests  Recent Labs  05/13/13 0335 05/14/13 0350  AST 42* 30  ALT 104* 79*  ALKPHOS 115 172*  BILITOT 1.8* 1.8*  PROT 6.4 6.7  ALBUMIN 2.7* 2.8*    Recent Labs  05/12/13 0505 05/13/13 0335  LIPASE 732* 148*   Cardiac Enzymes No results found for this basename: CKTOTAL, CKMB, CKMBINDEX, TROPONINI,  in the last 72 hours BNP No components found with this basename: POCBNP,  D-Dimer No results found for this basename: DDIMER,  in the last 72 hours Hemoglobin A1C No results found for this basename: HGBA1C,  in the last 72 hours Fasting Lipid Panel No results found for this basename: CHOL, HDL, LDLCALC, TRIG, CHOLHDL, LDLDIRECT,  in the last 72 hours Thyroid Function Tests No results found for this basename: TSH, T4TOTAL, FREET3, T3FREE, THYROIDAB,  in the last 72 hours  TELE  Sinus tachycardia  ECG    Radiology/Studies  Dg Chest 1 View  05/10/2013   CLINICAL DATA:  Fever status post ERCP  EXAM: CHEST - 1 VIEW  COMPARISON:  None.  FINDINGS: Cardiac shadow is within normal limits. The overall inspiratory effort is poor. Mild atelectatic changes are noted in the bases. No pneumothorax is seen. The upper abdomen shows gaseous distension of the colon.  IMPRESSION: Mild bibasilar atelectatic changes.   Electronically Signed   By: Alcide Clever M.D.   On: 05/10/2013 17:14   Dg Abd 1 View  05/10/2013   CLINICAL DATA:  Status post ERCP with abdominal pain  EXAM: ABDOMEN - 1 VIEW  COMPARISON:  None.  FINDINGS: Scattered large and small bowel gas is noted. There is gaseous distension of the: Likely related to the recent ERCP. Contrast material is noted within the right colon and appendix also related to the recent ERCP. A biliary stent is noted in the right upper quadrant. A small amount of biliary air is seen consistent with stent placement. No all evidence of  free intraperitoneal air is seen.  IMPRESSION: Changes consistent with a recent ERCP. No acute abnormality is noted.   Electronically Signed   By: Alcide Clever M.D.   On: 05/10/2013 17:15   US Abdomen Complete  05/09/2013   CLINICAL DATA:  Right upper quadrant pain.  EXAM: ULTRASOUND ABDOMEN COMPLETE  COMPARISON:  None.  FINDINGS: Gallbladder:  There are multiple echogenic stones in the gallbladder. Gallbladder wall is mildly thickened, measuring 0.3 cm. There is hypoechoic material in the right upper quadrant suggestive for complex fluid. The complex fluid extends into the right flank region. There is simple appearing fluid around the gallbladder. Reportedly, the patient does not have a sonographic Murphy's sign but the patient has received pain medication.  Common bile duct:  Diameter: 0.5 cm  Liver:  Increased echogenicity of the liver suggests hepatic steatosis. The liver parenchyma is also heterogeneous with loss of the internal architecture.  IVC:  Not well seen.  Pancreas:  Not well seen.  Spleen:  Size and appearance within normal limits. Spleen measures 6.4 cm in length.  Right Kidney:  Length: 10.5 cm. Echogenicity within normal limits. No mass or hydronephrosis visualized.  Left Kidney:  Length: 10.8 cm. Echogenicity within normal limits. No mass or hydronephrosis visualized.  Abdominal aorta:  No aneurysm visualized.  Other findings:  None.  IMPRESSION: Cholelithiasis with mild gallbladder wall thickening. There is complex fluid in the right upper quadrant suggesting an acute inflammatory process.  Hepatic steatosis.   Electronically Signed   By: Richarda Overlie M.D.   On: 05/09/2013 15:25   Ct Abdomen Pelvis W Contrast  05/13/2013   CLINICAL DATA:  Abdominal distention, abdominal pain.  Pancreatitis.  EXAM: CT ABDOMEN AND PELVIS WITH CONTRAST  TECHNIQUE: Multidetector CT imaging of the abdomen and pelvis was performed using the standard protocol following bolus administration of intravenous contrast.   CONTRAST:  OMNIPAQUE IOHEXOL 300 MG/ML  SOLN  COMPARISON:  05/09/2013  FINDINGS: There are small bilateral pleural effusions. Lower lobe atelectasis or infiltrates. Heart is normal size.  Mild diffuse fatty infiltration of the liver. Layering gallstones within the gallbladder. Spleen, adrenals and left kidney unremarkable. Small cyst in the midpole of the right kidney.  Again noted is enlargement and significant inflammation surrounding the pancreas compatible with acute pancreatitis. There are areas of poor enhancement within the pancreatic body and tail. This could simply represent fluid and developing fluid collections, but cannot exclude areas of necrosis.  Interval placement of biliary stent. This extends from the mid to distal common bile duct into the small bowel. Continued fluid collections in the  left anterior para renal space. Fluid/ascites around the liver.  There is gaseous distention of the colon and to a lesser extent small bowel. This likely reflects ileus. Aorta and branch vessels are patent and unremarkable. Urinary bladder is mildly distended, grossly unremarkable. No free air or adenopathy.  IMPRESSION: Worsening changes of acute pancreatitis with increasing inflammation around the pancreas. New infiltrating areas of low density throughout the pancreas. This could represent developing fluid collections or areas of necrosis.  Perihepatic ascites.  Cholelithiasis.  Diffuse fatty infiltration of the liver.  Small bilateral pleural effusions with bibasilar atelectasis or infiltrates.  Suspect ileus.  These results will be called to the ordering clinician or representative by the Radiologist Assistant, and communication documented in the PACS Dashboard.   Electronically Signed   By: Charlett Nose M.D.   On: 05/13/2013 13:45   Ct Abdomen Pelvis W Contrast  05/09/2013   CLINICAL DATA:  Abdominal pain.  EXAM: CT ABDOMEN AND PELVIS WITH CONTRAST  TECHNIQUE: Multidetector CT imaging of the abdomen  and pelvis was performed using the standard protocol following bolus administration of intravenous contrast.  CONTRAST:  80mL OMNIPAQUE IOHEXOL 300 MG/ML  SOLN  COMPARISON:  Ultrasound 05/09/2013  FINDINGS: Lung bases are clear.  There is no evidence for free air.  Decreased attenuation of the liver is consistent with hepatic steatosis. There is a small amount of fluid along the right hepatic dome. There is fluid around the inferior gallbladder and the duodenum. There are multiple gallstones. The gallbladder wall is dense and could represent hyperemia. Portal venous system is patent. There is marked inflammation involving the descending duodenum. There is wall thickening along the medial duodenum wall, measuring up to 1.6 cm. Multiple calcifications in the distal common bile duct are suggestive for choledocholithiasis. There is no significant common bile duct dilatation. The base of the gallbladder or the cystic duct is distended, measuring up to 1.5 cm, and containing multiple stones. There is fluid or edema in the porta hepatitis. There is fluid and edema tracking around the pancreas. Fluid extends into the left upper quadrant and adjacent to the stomach and spleen. No significant pancreatic duct dilatation. Normal appearance of the spleen.  There is a 1.7 cm low-density structure along the right kidney upper pole. The Hounsfield units roughly measure 39 and not consistent with a simple cyst. However, the Hounsfield units do not significantly change on the delayed images and probably represents a proteinaceous or hemorrhagic cyst. There is a small cyst along the medial right kidney. Question a punctate stone in the right kidney without hydronephrosis. There are two nonobstructive left kidney stones.  No gross abnormality to the prostate, seminal vesicles or urinary bladder. No significant lymphadenopathy. No acute inflammation involving the appendix. Normal appearance of the jejunum and ileum. No gross abnormality  to the colon.  Bilateral pars defects at L5. No significant anterolisthesis at L5-S1.  IMPRESSION: There is extensive fluid and inflammation in the right upper quadrant, centered around the duodenum. There is marked asymmetric duodenal wall thickening with stones in the distal common bile duct. Evidence for pancreatitis.  Multiple gallstones and cannot exclude cholecystitis.  Decreased attenuation of the liver suggests hepatic steatosis.  Nonobstructive kidney stones.  1.7 cm indeterminate right renal structure. This probably represents a hyperdense or proteinaceous renal cyst but indeterminate. Consider follow-up ultrasound after the acute process has resolved. This area was not well evaluated on today's ultrasound.  These results were called by telephone at the time of interpretation on 05/09/2013  at 3:43 PM to Dr. Emilia Beck , who verbally acknowledged these results.   Electronically Signed   By: Richarda Overlie M.D.   On: 05/09/2013 15:47   Dg Chest Port 1 View  05/12/2013   CLINICAL DATA:  Hypoxia.  EXAM: PORTABLE CHEST - 1 VIEW  COMPARISON:  05/10/2013.  FINDINGS: Poor inspiration. Bibasilar atelectasis. Heart size normal. Pulmonary vascularity normal. No pleural effusion or pneumothorax. No acute osseous abnormality.  IMPRESSION: Poor inspiration with bibasilar atelectasis.   Electronically Signed   By: Maisie Fus  Register   On: 05/12/2013 08:15   Dg Ercp Biliary & Pancreatic Ducts  05/10/2013   CLINICAL DATA:  Biliary stent placement.  Common duct stone  EXAM: ERCP  TECHNIQUE: Multiple spot images obtained with the fluoroscopic device and submitted for interpretation post-procedure.  COMPARISON:  CT 05/09/2013  FINDINGS: Three digital images were obtained. There is partial opacification of the bile ducts with contrast. Question small common duct stones. Biliary tree not completely evaluated for stones. No obstruction  Plastic common bile duct stent placed in good position.  IMPRESSION: Common bile  duct stent placed at ERCP.  These images were submitted for radiologic interpretation only. Please see the procedural report for the amount of contrast and the fluoroscopy time utilized.   Electronically Signed   By: Marlan Palau M.D.   On: 05/10/2013 15:05   Dg Abd 2 Views  05/11/2013   CLINICAL DATA:  Abdominal pain and distention.  ERCP yesterday.  EXAM: ABDOMEN - 2 VIEW  COMPARISON:  Radiograph dated 05/10/2013  FINDINGS: Common bile duct stent is in place, unchanged. There is atelectasis at both lung bases. No free air. There is air and contrast throughout the nondistended colon, similar to the appearance on the prior study. No dilated small bowel.  IMPRESSION: No evidence of free air. No significant change since the prior exam. Slight bibasilar atelectasis.   Electronically Signed   By: Geanie Cooley M.D.   On: 05/11/2013 18:17   Dg Abd Portable 1v  05/14/2013   CLINICAL DATA:  Ileus.  EXAM: PORTABLE ABDOMEN - 1 VIEW  COMPARISON:  05/13/2013.  FINDINGS: Again noted is moderate gaseous distention throughout the colon, particularly in the transverse colon; this is nonspecific. The residual oral contrast material remains in the colon, predominantly of ascending colon, opacifying the appendix. Several nondilated loops of gas-filled small bowel are noted in the central abdomen. A nasogastric tube is seen coiled in the proximal stomach. No definite pneumoperitoneum on this single supine view.  IMPRESSION: 1. Nonspecific, nonobstructive bowel gas pattern. 2. No pneumoperitoneum. 3. Nasogastric tube is coiled in the proximal stomach.   Electronically Signed   By: Trudie Reed M.D.   On: 05/14/2013 07:28   Dg Abd Portable 1v  05/13/2013   CLINICAL DATA:  Evaluate nasogastric tube placement.  EXAM: PORTABLE ABDOMEN - 1 VIEW  COMPARISON:  Abdominal radiograph 05/11/2013.  FINDINGS: There has been interval placement of a nasogastric tube with tip in the body of the stomach and side port just distal to the  gastroesophageal junction. Gaseous distention throughout the colon is noted. Multiple nondilated gas-filled loops of small bowel are identified. No pneumoperitoneum.  IMPRESSION: 1. Tip of nasogastric tube is in the mid body of the stomach with side port near the gastroesophageal junction.   Electronically Signed   By: Trudie Reed M.D.   On: 05/13/2013 15:16    ASSESSMENT AND PLAN  1. Gallstone pancreatitis  2. sinus tachycardia secondary to medical issues.  Plan: Possible surgery Monday morning.  Cleared for surgery from cardiac standpoint.  Continue IV Lopressor for rate control.  Signed,  Cassell Clement  MD

## 2013-05-14 NOTE — Progress Notes (Addendum)
[facility] Progress Note [place] TEAM 1 - Stepdown ICU Team    Nicolas Chandler ZOX:[id number] DOB: 05-23-1966 DOA: 05/09/2013 PCP: [name], MD  HPI/Subjective: Patient endorsed improved abdominal pain and distension- pain in mid/lower back persists- asking for ice chips due to dry mouth -has been ambulating  Brief narrative: 47 year old patient with known history of diabetes hypertension and dyslipidemia. Presented to the hospital with a three-week history of abdominal pain. This pain worsened 24 hours prior to presentation and was associated with nausea and vomiting. The patient characterizes the pain as being quite severe. States he never had this type of discomfort before. Nothing improves the pain but nothing made it worse. He denied issues with fevers or chills chest pain or shortness of breath prior to presentation. Evaluation in the ER revealed elevated lipase and leukocytosis. Additional diagnostic evaluation revealed choledocholithiasis. Gastroenterology was consulted.  Assessment/Plan: Pancreatitis due to choledocholithiasis - Ileus - s/p ERCP w/stent placement -was receiving Unasyn- stopped yesterday by GI-  -need for antibiotics discussed with Sondra Come who will f/u with Dr Rhea Belton -Surgery following-  eventual cholecystectomy after current issues resolve  Ileus- severe abdominal distension and abdominal pain -repeat CT abd revealed ileus and increasing peripancreatic fluid (possible necrosis?) - I ordered and NG to be placed yesterday resulting in improved abdominal distension -  -no bowel sounds today while stopping suction on NG tube- abd xray reveals residual contrast in ascending colon with continued distension of the small bowel and colon and no obstruction - give a one time fleets enema to stimulate bowel- -- surgery plans on giving a trial of clamping  - discussed need to limit narcotics with patient- - pain mostly in back now (from continued  pancreatitis vs laying in bed?) rather than abdomen- will give a short trial of Toradol with may help limit Dilaudid use - follow Cr with IV Toradol use (hold lasix today) -add Pepcid to protect stomach - OOB- ambulate as much as possibe  Respiratory distress - 12/19 and 12/20 - due to significantly distended abdomen-  - resolved after NG  Sepsis with biliary pancreatitis  -Tachycardia with leukocytosis, fevers improving - Leukocytosis persists -blood cultures NGTD  AKI /hypernatremia/Dehydration - resolved with aggressive hydration - noted to be fluid over loaded with third spacing and therefore IVF discontinued and Lasix given  -- will hold off on Lasix today but also not give additional fluids (for today) - reassess tomorrow for resumption of fluids - positive balance by 14.5 L- weight up by 10 lbs- urine output still adequate- 1700 cc yesterday  HTN  -cont B Blocker for now- allow auto diuresis which may correct BP- control pain as much as possible  Sinus tach  -Likely physiologic secondary to sepsis per above -Cardiology following- Lopressor ordered IV -ECHO normal  Hypokalemia  -Monitor and replace as needed  DM  -Holding PO meds -cont SSI- sugars reasonable  HLD  -Holding PO meds   DVT prophylaxis: Subcutaneous heparin Code Status: Full Family Communication: Patient and wife at the bedside Disposition Plan/Expected LOS: Step down Isolation: None Nutritional Status: Acute protein calorie malnutrition related to acute pancreatitis  Consultants: Gastroenterology Cardiology General surgery  Procedures: ERCP with stent placement  12/17  Antibiotics: Unasyn 12/16 >>>  Objective: Blood pressure 179/106, pulse 95, temperature 98.2 F (36.8 C), temperature source Oral, resp. rate 18, height 5\' 6"  (1.676 m), weight 75.6 kg (166 lb 10.7 oz), SpO2 100.00%.  Intake/Output Summary (Last 24 hours) at 05/14/13 1007 Last data filed at 05/14/13  0900  Gross per 24  hour  Intake    100 ml  Output   2000 ml  Net  -1900 ml     Exam: General: AAO x 3, No acute respiratory distress Lungs: Clear to auscultation bilaterally without wheezes or crackles, RA- able to take deep breaths now Cardiovascular: Regular rate and rhythm without murmur gallop or rub normal S1 and S2, no peripheral edema or JVD Abdomen: milder distension today, tympanic to percussion, bowel sounds continue to be inaudible, no rebound, no obvious scites, no appreciable mass Musculoskeletal: No significant cyanosis, clubbing of bilateral lower extremities Neurological: Alert and oriented x 3, moves all extremities x 4 without focal neurological deficits, CN 2-12 intact  Scheduled Meds:  Scheduled Meds: . furosemide  20 mg Intravenous Q12H  . heparin subcutaneous  5,000 Units Subcutaneous Q8H  . insulin aspart  0-15 Units Subcutaneous Q4H  . metoprolol  5 mg Intravenous Q6H   Continuous Infusions:    **Reviewed in detail by the Attending Physician  Data Reviewed: Basic Metabolic Panel:  Recent Labs Lab 05/09/13 1232 05/09/13 1908  05/11/13 0315 05/11/13 1220 05/11/13 1440 05/12/13 0505 05/13/13 0335 05/14/13 0350  NA 136  --   < > 140 147*  --  147* 143 145  K 2.9*  --   < > 6.0* 4.6 5.1 4.3 4.4 3.5  CL 96  --   < > 110 119*  --  117* 111 103  CO2 24  --   < > 16* 17*  --  19 26 31   GLUCOSE 201*  --   < > 89 128*  --  102* 145* 128*  BUN 14  --   < > 45* 45*  --  31* 21 22  CREATININE 1.38*  --   < > 2.80* 2.19*  --  1.40* 1.18 1.10  CALCIUM 9.6  --   < > 8.3* 7.8*  --  7.9* 7.8* 8.1*  MG  --  1.8  --   --   --   --   --   --   --   < > = values in this interval not displayed. Liver Function Tests:  Recent Labs Lab 05/10/13 0453 05/11/13 0315 05/12/13 0505 05/13/13 0335 05/14/13 0350  AST 221* 105* 77* 42* 30  ALT 432* 226* 141* 104* 79*  ALKPHOS 215* 153* 124* 115 172*  BILITOT 6.2* 4.1* 2.6* 1.8* 1.8*  PROT 7.2 6.8 6.6 6.4 6.7  ALBUMIN 3.6 3.1* 2.7*  2.7* 2.8*    Recent Labs Lab 05/09/13 1232 05/11/13 0315 05/12/13 0505 05/13/13 0335  LIPASE >3000* 1778* 732* 148*   No results found for this basename: AMMONIA,  in the last 168 hours CBC:  Recent Labs Lab 05/09/13 1232  05/11/13 0315 05/11/13 1440 05/12/13 0505 05/13/13 0335 05/14/13 0350  WBC 34.0*  < > 28.1* 19.1* 16.9* 13.8* 15.5*  NEUTROABS 24.6*  --   --   --   --   --   --   HGB 17.5*  < > 15.2 13.5 13.0 12.0* 12.5*  HCT 49.4  < > 45.8 41.3 40.0 37.6* 39.5  MCV 91.8  < > 95.0 95.6 97.1 97.7 96.1  PLT 239  < > 133* 90* 112* 120* 141*  < > = values in this interval not displayed. Cardiac Enzymes: No results found for this basename: CKTOTAL, CKMB, CKMBINDEX, TROPONINI,  in the last 168 hours BNP (last 3 results) No results found for this basename: PROBNP,  in the last 8760 hours CBG:  Recent Labs Lab 05/13/13 1549 05/13/13 2021 05/14/13 0019 05/14/13 0358 05/14/13 0821  GLUCAP 101* 111* 155* 124* 124*    Recent Results (from the past 240 hour(s))  URINE CULTURE     Status: None   Collection Time    05/09/13  1:47 PM      Result Value Range Status   Specimen Description URINE, CLEAN CATCH   Final   Special Requests Normal   Final   Culture  Setup Time     Final   Value: 05/09/2013 22:24     Performed at [facility] Count     Final   Value: NO GROWTH     Performed at [facility]   Culture     Final   Value: NO GROWTH     Performed at [facility]   Report Status 05/10/2013 FINAL   Final  CULTURE, BLOOD (ROUTINE X 2)     Status: None   Collection Time    05/11/13  9:35 AM      Result Value Range Status   Specimen Description BLOOD RIGHT ANTECUBITAL   Final   Special Requests     Final   Value: BOTTLES DRAWN AEROBIC AND ANAEROBIC 10CC AER 5CC ANA   Culture  Setup Time     Final   Value: 05/11/2013 14:59     Performed at [facility]   Culture     Final   Value:        BLOOD CULTURE RECEIVED NO GROWTH  TO DATE CULTURE WILL BE HELD FOR 5 DAYS BEFORE ISSUING A FINAL NEGATIVE REPORT     Performed at [facility]   Report Status PENDING   Incomplete  CULTURE, BLOOD (ROUTINE X 2)     Status: None   Collection Time    05/11/13  9:50 AM      Result Value Range Status   Specimen Description BLOOD WRIST RIGHT   Final   Special Requests BOTTLES DRAWN AEROBIC AND ANAEROBIC 10CC   Final   Culture  Setup Time     Final   Value: 05/11/2013 14:59     Performed at [facility]   Culture     Final   Value:        BLOOD CULTURE RECEIVED NO GROWTH TO DATE CULTURE WILL BE HELD FOR 5 DAYS BEFORE ISSUING A FINAL NEGATIVE REPORT     Performed at [facility]   Report Status PENDING   Incomplete     Studies:  Recent x-ray studies have been reviewed in detail by the Attending Physician    Theodore Virgin,MD [phone] 05/14/2013, 10:07 AM

## 2013-05-14 NOTE — Progress Notes (Signed)
4 Days Post-Op  Subjective: Pt states he feel better today. NGT place overnight   Objective: Vital signs in last 24 hours: Temp:  [97.3 F (36.3 C)-97.9 F (36.6 C)] 97.9 F (36.6 C) (12/21 0400) Pulse Rate:  [85-102] 92 (12/21 0630) Resp:  [11-28] 17 (12/21 0630) BP: (125-162)/(81-113) 158/100 mmHg (12/21 0630) SpO2:  [91 %-100 %] 100 % (12/21 0630) Last BM Date: 05/13/13  Intake/Output from previous day: 12/20 0701 - 12/21 0700 In: 600 [I.V.:400; IV Piggyback:200] Out: 550 [Emesis/NG output:550] Intake/Output this shift:    General appearance: alert and cooperative GI: s/nd/min ttp RUQ, activeBS  Lab Results:   Recent Labs  05/13/13 0335 05/14/13 0350  WBC 13.8* 15.5*  HGB 12.0* 12.5*  HCT 37.6* 39.5  PLT 120* 141*   BMET  Recent Labs  05/13/13 0335 05/14/13 0350  NA 143 145  K 4.4 3.5  CL 111 103  CO2 26 31  GLUCOSE 145* 128*  BUN 21 22  CREATININE 1.18 1.10  CALCIUM 7.8* 8.1*   PT/INR No results found for this basename: LABPROT, INR,  in the last 72 hours ABG  Recent Labs  05/11/13 1040  PHART 7.241*  HCO3 18.0*    Studies/Results: Ct Abdomen Pelvis W Contrast  05/13/2013   CLINICAL DATA:  Abdominal distention, abdominal pain.  Pancreatitis.  EXAM: CT ABDOMEN AND PELVIS WITH CONTRAST  TECHNIQUE: Multidetector CT imaging of the abdomen and pelvis was performed using the standard protocol following bolus administration of intravenous contrast.  CONTRAST:  OMNIPAQUE IOHEXOL 300 MG/ML  SOLN  COMPARISON:  05/09/2013  FINDINGS: There are small bilateral pleural effusions. Lower lobe atelectasis or infiltrates. Heart is normal size.  Mild diffuse fatty infiltration of the liver. Layering gallstones within the gallbladder. Spleen, adrenals and left kidney unremarkable. Small cyst in the midpole of the right kidney.  Again noted is enlargement and significant inflammation surrounding the pancreas compatible with acute pancreatitis. There are areas  of poor enhancement within the pancreatic body and tail. This could simply represent fluid and developing fluid collections, but cannot exclude areas of necrosis.  Interval placement of biliary stent. This extends from the mid to distal common bile duct into the small bowel. Continued fluid collections in the left anterior para renal space. Fluid/ascites around the liver.  There is gaseous distention of the colon and to a lesser extent small bowel. This likely reflects ileus. Aorta and branch vessels are patent and unremarkable. Urinary bladder is mildly distended, grossly unremarkable. No free air or adenopathy.  IMPRESSION: Worsening changes of acute pancreatitis with increasing inflammation around the pancreas. New infiltrating areas of low density throughout the pancreas. This could represent developing fluid collections or areas of necrosis.  Perihepatic ascites.  Cholelithiasis.  Diffuse fatty infiltration of the liver.  Small bilateral pleural effusions with bibasilar atelectasis or infiltrates.  Suspect ileus.  These results will be called to the ordering clinician or representative by the Radiologist Assistant, and communication documented in the PACS Dashboard.   Electronically Signed   By: Charlett Nose M.D.   On: 05/13/2013 13:45   Dg Chest Port 1 View  05/12/2013   CLINICAL DATA:  Hypoxia.  EXAM: PORTABLE CHEST - 1 VIEW  COMPARISON:  05/10/2013.  FINDINGS: Poor inspiration. Bibasilar atelectasis. Heart size normal. Pulmonary vascularity normal. No pleural effusion or pneumothorax. No acute osseous abnormality.  IMPRESSION: Poor inspiration with bibasilar atelectasis.   Electronically Signed   By: Maisie Fus  Register   On: 05/12/2013 08:15  Dg Abd Portable 1v  05/13/2013   CLINICAL DATA:  Evaluate nasogastric tube placement.  EXAM: PORTABLE ABDOMEN - 1 VIEW  COMPARISON:  Abdominal radiograph 05/11/2013.  FINDINGS: There has been interval placement of a nasogastric tube with tip in the body of the  stomach and side port just distal to the gastroesophageal junction. Gaseous distention throughout the colon is noted. Multiple nondilated gas-filled loops of small bowel are identified. No pneumoperitoneum.  IMPRESSION: 1. Tip of nasogastric tube is in the mid body of the stomach with side port near the gastroesophageal junction.   Electronically Signed   By: Trudie Reed M.D.   On: 05/13/2013 15:16    Anti-infectives: Anti-infectives   Start     Dose/Rate Route Frequency Ordered Stop   05/09/13 2200  Ampicillin-Sulbactam (UNASYN) 3 g in sodium chloride 0.9 % 100 mL IVPB  Status:  Discontinued     3 g 100 mL/hr over 60 Minutes Intravenous Every 6 hours 05/09/13 1733 05/13/13 1821   05/09/13 1630  Ampicillin-Sulbactam (UNASYN) 3 g in sodium chloride 0.9 % 100 mL IVPB     3 g 100 mL/hr over 60 Minutes Intravenous  Once 05/09/13 1605 05/09/13 1721      Assessment/Plan: Biliary  Pancreatitis s/p ERCP -clamp NGT and may remove later today if tolerating clamped trial -Possible OR in AM, consent    LOS: 5 days    Marigene Ehlers., [facility] 05/14/2013

## 2013-05-14 NOTE — Progress Notes (Signed)
Patient seen, examined, and I agree with the above documentation, including the assessment and plan. Gallstone pancreatitis evolving as noted on CT abd yesterday (imaging reviewed - areas of possible necrosis vs. Fluid collections) CBD stent to place which should facilitate drainage of biliary system, until he improves for cholecystectomy and eventual repeat ERCP for stent and stone removal Agree with imipenem for now He looks better today, not tachycardic. NGT has helped with bilious outpt, felt related to pancreatitis associated ileus Renal function is improving which also is very reassuring Agree with restarting IVFs with attention to respiratory status Need to begin nutrition soon, and if ileus/NGT outpt remains then will need TPN (I have discussed this with he and his wife and explained the importance of nutrition in the management of acute pancreatitis) Dr. Elnoria Howard will re-assume care tomorrow

## 2013-05-14 NOTE — Progress Notes (Signed)
Patient ID: Nicolas Chandler, male   DOB: [birth year], 47 y.o.   MRN: [id number] [facility] Progress Note  Subjective: Feeling a bit better.Marland Kitchen Up in chair , less SOB. Abdominal pain improved 6/10  Currently Wife at bedside with multiple questions regarding severity of illness.  Objective:  Vital signs in last 24 hours: Temp:  [97.3 F (36.3 C)-98.2 F (36.8 C)] 98.2 F (36.8 C) (12/21 0800) Pulse Rate:  [87-102] 102 (12/21 1100) Resp:  [11-28] 12 (12/21 1100) BP: (125-179)/(81-113) 129/98 mmHg (12/21 1100) SpO2:  [91 %-100 %] 98 % (12/21 1100) Last BM Date: 05/13/13 General:   Alert,  Well-developed,  [place] male   in NAD Heart:  tachyRegular rate and rhythm; no murmurs Pulm;decreased BS bilat bases, no wheeze Abdomen:  Soft, tender across upper abdomen,Bs quiet +guarding, norebound.   Extremities:  Without edema. Neurologic:  Alert and  oriented x4;  grossly normal neurologically. Psych:  Alert and cooperative. Normal mood and affect.  Intake/Output from previous day: 12/20 0701 - 12/21 0700 In: 600 [I.V.:400; IV Piggyback:200] Out: 1700 [Urine:900; Emesis/NG output:800] Intake/Output this shift: Total I/O In: -  Out: 300 [Urine:300]  Lab Results:  Recent Labs  05/12/13 0505 05/13/13 0335 05/14/13 0350  WBC 16.9* 13.8* 15.5*  HGB 13.0 12.0* 12.5*  HCT 40.0 37.6* 39.5  PLT 112* 120* 141*   BMET  Recent Labs  05/12/13 0505 05/13/13 0335 05/14/13 0350  NA 147* 143 145  K 4.3 4.4 3.5  CL 117* 111 103  CO2 19 26 31   GLUCOSE 102* 145* 128*  BUN 31* 21 22  CREATININE 1.40* 1.18 1.10  CALCIUM 7.9* 7.8* 8.1*   LFT  Recent Labs  05/14/13 0350  PROT 6.7  ALBUMIN 2.8*  AST 30  ALT 79*  ALKPHOS 172*  BILITOT 1.8*     Assessment / Plan: #1  47 yo male  With acute severe post ERCP pancreatitis /possible necrosis on CT. He is stable, clinically some improvement Keep NPO  Antibiotics were stopped- will restart  Primaxin for now #2  choledocholithiais- s/p ERCP stent #3 AKI- resolving- continue volume support #4 DM Active Problems:   Choledocholithiasis   Abdominal pain   Leukocytosis   Hypokalemia   AKI (acute kidney injury)   DM (diabetes mellitus)   HTN (hypertension)   HLD (hyperlipidemia)   Pancreatitis   Elevated LFTs   Hepatic steatosis   Renal cyst   Thrombocytopenia, unspecified     LOS: 5 days   Nicolas Chandler  05/14/2013, 12:21 PM

## 2013-05-15 LAB — COMPREHENSIVE METABOLIC PANEL
ALT: 51 U/L (ref 0–53)
AST: 23 U/L (ref 0–37)
Albumin: 2.4 g/dL — ABNORMAL LOW (ref 3.5–5.2)
Alkaline Phosphatase: 102 U/L (ref 39–117)
CO2: 30 mEq/L (ref 19–32)
Chloride: 101 mEq/L (ref 96–112)
Creatinine, Ser: 1.06 mg/dL (ref 0.50–1.35)
GFR calc non Af Amer: 83 mL/min — ABNORMAL LOW (ref >90)
Potassium: 4.3 mEq/L (ref 3.5–5.1)
Sodium: 141 mEq/L (ref 135–145)
Total Bilirubin: 1.7 mg/dL — ABNORMAL HIGH (ref 0.3–1.2)

## 2013-05-15 LAB — GLUCOSE, CAPILLARY
Glucose-Capillary: 123 mg/dL — ABNORMAL HIGH (ref 70–99)
Glucose-Capillary: 126 mg/dL — ABNORMAL HIGH (ref 70–99)
Glucose-Capillary: 128 mg/dL — ABNORMAL HIGH (ref 70–99)
Glucose-Capillary: 99 mg/dL (ref 70–99)

## 2013-05-15 LAB — CBC
HCT: 36 % — ABNORMAL LOW (ref 39.0–52.0)
MCV: 94.7 fL (ref 78.0–100.0)
Platelets: 142 10*3/uL — ABNORMAL LOW (ref 150–400)
RBC: 3.8 MIL/uL — ABNORMAL LOW (ref 4.22–5.81)
RDW: 13.4 % (ref 11.5–15.5)
WBC: 13.8 10*3/uL — ABNORMAL HIGH (ref 4.0–10.5)

## 2013-05-15 MED ORDER — PROCHLORPERAZINE EDISYLATE 5 MG/ML IJ SOLN
10.0000 mg | Freq: Four times a day (QID) | INTRAMUSCULAR | Status: DC | PRN
  Administered 2013-05-15 – 2013-05-16 (×2): 10 mg via INTRAVENOUS
  Filled 2013-05-15 (×2): qty 2

## 2013-05-15 MED ORDER — FAT EMULSION 20 % IV EMUL
250.0000 mL | INTRAVENOUS | Status: AC
  Administered 2013-05-15: 250 mL via INTRAVENOUS
  Filled 2013-05-15: qty 250

## 2013-05-15 MED ORDER — CHLORPROMAZINE HCL 25 MG/ML IJ SOLN: 25.0000 mg | Freq: Once | INTRAMUSCULAR | Status: DC

## 2013-05-15 MED ORDER — SODIUM CHLORIDE 0.45 % IV SOLN
INTRAVENOUS | Status: AC
  Administered 2013-05-16: 05:00:00 via INTRAVENOUS

## 2013-05-15 MED ORDER — TRACE MINERALS CR-CU-F-FE-I-MN-MO-SE-ZN IV SOLN
INTRAVENOUS | Status: AC
  Administered 2013-05-15: 18:00:00 via INTRAVENOUS
  Filled 2013-05-15: qty 1000

## 2013-05-15 MED ORDER — SODIUM CHLORIDE 0.9 % IV SOLN
500.0000 mg | Freq: Four times a day (QID) | INTRAVENOUS | Status: DC
  Administered 2013-05-16 – 2013-05-19 (×14): 500 mg via INTRAVENOUS
  Filled 2013-05-15 (×17): qty 500

## 2013-05-15 MED ORDER — SODIUM CHLORIDE 0.9 % IJ SOLN
10.0000 mL | Freq: Two times a day (BID) | INTRAMUSCULAR | Status: DC
  Administered 2013-05-15: 10 mL
  Administered 2013-05-15: 20 mL
  Administered 2013-05-16: 10 mL
  Administered 2013-05-16: 20 mL
  Administered 2013-05-17 (×2): 10 mL
  Administered 2013-05-18: 23 mL
  Administered 2013-05-19: 15 mL

## 2013-05-15 MED ORDER — SODIUM CHLORIDE 0.9 % IJ SOLN
10.0000 mL | INTRAMUSCULAR | Status: DC | PRN
  Administered 2013-05-19 – 2013-05-21 (×2): 10 mL

## 2013-05-15 NOTE — Progress Notes (Signed)
[facility] Progress Note [place] TEAM 1 - Stepdown ICU Team   Nicolas Chandler ZOX:[id number] DOB: 02/21/1966 DOA: 05/09/2013 PCP: [name], MD  Brief narrative: 47 year old patient with known history of diabetes hypertension and dyslipidemia. Presented to the hospital with a three-week history of abdominal pain. This pain worsened 24 hours prior to presentation and was associated with nausea and vomiting. The patient characterizes the pain as being quite severe. States he never had this type of discomfort before. Nothing improves the pain but nothing made it worse. He denied issues with fevers or chills chest pain or shortness of breath prior to presentation. Evaluation in the ER revealed elevated lipase and leukocytosis. Additional diagnostic evaluation revealed choledocholithiasis. Gastroenterology was consulted.  HPI/Subjective: Patient endorsed minimal to no abdominal pain - primarily c/o intractable hiccups.  Assessment/Plan:  Sepsis/Pancreatitis due to choledocholithiasis  -Sepsis physiology has resolved -s/p ERCP w/stent placement -was receiving Unasyn - changed to Primaxin 12/21 by GI -Surgery following-  eventual cholecystectomy after current issues resolved -blood cx's NGTD  Ileus due to severe pancreatitis -repeat CT abd revealed ileus and increasing peripancreatic fluid (possible necrosis?) -cont NGT -surgery attempted clamping trial but pt did not tolerate -discussed need to limit narcotics with patient- - pain mostly in back now (from continued pancreatitis vs laying in bed?) rather than abdomen - will give a short trial of Toradol with may help limit Dilaudid use - follow Cr with IV Toradol use (hold lasix today) - add Pepcid to protect stomach -OOB - ambulate as much as possible  Intractable hiccups -begin IV Compazine -etiology from extensive diaphragmatic irritation from pancreatitis  Severe protein calorie malnutrition -insert PICC and have  pharmacy manage TNA  Respiratory distress - 12/19 and 12/20 - due to significantly distended abdomen/abdom compartment syndrome  - resolved after NG placed  AKI /hypernatremia/Dehydration - resolved with aggressive hydration - positive balance by 14.5 L - weight up by 10 lbs - urine output still adequate  HTN  -cont B Blocker for now - control pain as much as possible  Sinus tachycardia -Likely physiologic secondary to sepsis/pain per above -Cardiology following- Lopressor ordered IV -ECHO normal  Hypokalemia  -Monitor and replace as needed  DM  -Holding PO meds -cont SSI - sugars reasonable  HLD  -Holding PO meds  DVT prophylaxis: Subcutaneous heparin Code Status: Full Family Communication: Patient and wife at the bedside Disposition Plan/Expected LOS: SDU  Consultants: Gastroenterology Cardiology General Surgery  Procedures: ERCP with stent placement  12/17  Antibiotics: Unasyn 12/16 >>>12/20 Primaxin 12/21 >>>  Objective: Blood pressure 163/99, pulse 88, temperature 97.9 F (36.6 C), temperature source Oral, resp. rate 18, height 5\' 6"  (1.676 m), weight 166 lb 10.7 oz (75.6 kg), SpO2 98.00%.  Intake/Output Summary (Last 24 hours) at 05/15/13 1200 Last data filed at 05/15/13 0000  Gross per 24 hour  Intake 1460.42 ml  Output   1326 ml  Net 134.42 ml    Exam: General: AAO x 3, No acute respiratory distress-frequent hiccups Lungs: Clear to auscultation bilaterally without wheezes or crackles, RA Cardiovascular: Regular rate and rhythm without murmur gallop or rub normal S1 and S2, no peripheral edema or JVD Abdomen: minimal distension, non tympanic to percussion since NGT placed, bowel sounds continue to be inaudible, no rebound, no obvious scites, no appreciable mass Musculoskeletal: No significant cyanosis, clubbing of bilateral lower extremities Neurological: Alert and oriented x 3, moves all extremities x 4 without focal neurological deficits, CN  2-12 intact  Scheduled Meds:  Scheduled Meds: .  famotidine (PEPCID) IV  20 mg Intravenous Q12H  . heparin subcutaneous  5,000 Units Subcutaneous Q8H  . imipenem-cilastatin  500 mg Intravenous Q6H  . insulin aspart  0-15 Units Subcutaneous Q4H  . ketorolac  15 mg Intravenous Q6H  . metoprolol  5 mg Intravenous Q6H   Continuous Infusions: . sodium chloride 125 mL/hr at 05/14/13 1355  . sodium chloride    . Marland KitchenTPN (CLINIMIX-E) Adult     And  . fat emulsion     Data Reviewed: Basic Metabolic Panel:  Recent Labs Lab 05/09/13 1232 05/09/13 1908  05/11/13 1220 05/11/13 1440 05/12/13 0505 05/13/13 0335 05/14/13 0350 05/15/13 0443  NA 136  --   < > 147*  --  147* 143 145 141  K 2.9*  --   < > 4.6 5.1 4.3 4.4 3.5 4.3  CL 96  --   < > 119*  --  117* 111 103 101  CO2 24  --   < > 17*  --  19 26 31 30   GLUCOSE 201*  --   < > 128*  --  102* 145* 128* 128*  BUN 14  --   < > 45*  --  31* 21 22 21   CREATININE 1.38*  --   < > 2.19*  --  1.40* 1.18 1.10 1.06  CALCIUM 9.6  --   < > 7.8*  --  7.9* 7.8* 8.1* 8.0*  MG  --  1.8  --   --   --   --   --   --   --   < > = values in this interval not displayed. Liver Function Tests:  Recent Labs Lab 05/11/13 0315 05/12/13 0505 05/13/13 0335 05/14/13 0350 05/15/13 0443  AST 105* 77* 42* 30 23  ALT 226* 141* 104* 79* 51  ALKPHOS 153* 124* 115 172* 102  BILITOT 4.1* 2.6* 1.8* 1.8* 1.7*  PROT 6.8 6.6 6.4 6.7 6.1  ALBUMIN 3.1* 2.7* 2.7* 2.8* 2.4*    Recent Labs Lab 05/09/13 1232 05/11/13 0315 05/12/13 0505 05/13/13 0335 05/15/13 0443  LIPASE >3000* 1778* 732* 148* 53   No results found for this basename: AMMONIA,  in the last 168 hours CBC:  Recent Labs Lab 05/09/13 1232  05/11/13 1440 05/12/13 0505 05/13/13 0335 05/14/13 0350 05/15/13 0443  WBC 34.0*  < > 19.1* 16.9* 13.8* 15.5* 13.8*  NEUTROABS 24.6*  --   --   --   --   --   --   HGB 17.5*  < > 13.5 13.0 12.0* 12.5* 11.6*  HCT 49.4  < > 41.3 40.0 37.6* 39.5 36.0*   MCV 91.8  < > 95.6 97.1 97.7 96.1 94.7  PLT 239  < > 90* 112* 120* 141* 142*  < > = values in this interval not displayed.  CBG:  Recent Labs Lab 05/14/13 1728 05/14/13 2006 05/14/13 2357 05/15/13 0408 05/15/13 0729  GLUCAP 114* 121* 123* 126* 128*    Recent Results (from the past 240 hour(s))  URINE CULTURE     Status: None   Collection Time    05/09/13  1:47 PM      Result Value Range Status   Specimen Description URINE, CLEAN CATCH   Final   Special Requests Normal   Final   Culture  Setup Time     Final   Value: 05/09/2013 22:24     Performed at [facility] Count     Final  Value: NO GROWTH     Performed at [facility]   Culture     Final   Value: NO GROWTH     Performed at [facility]   Report Status 05/10/2013 FINAL   Final  CULTURE, BLOOD (ROUTINE X 2)     Status: None   Collection Time    05/11/13  9:35 AM      Result Value Range Status   Specimen Description BLOOD RIGHT ANTECUBITAL   Final   Special Requests     Final   Value: BOTTLES DRAWN AEROBIC AND ANAEROBIC 10CC AER 5CC ANA   Culture  Setup Time     Final   Value: 05/11/2013 14:59     Performed at [facility]   Culture     Final   Value:        BLOOD CULTURE RECEIVED NO GROWTH TO DATE CULTURE WILL BE HELD FOR 5 DAYS BEFORE ISSUING A FINAL NEGATIVE REPORT     Performed at [facility]   Report Status PENDING   Incomplete  CULTURE, BLOOD (ROUTINE X 2)     Status: None   Collection Time    05/11/13  9:50 AM      Result Value Range Status   Specimen Description BLOOD WRIST RIGHT   Final   Special Requests BOTTLES DRAWN AEROBIC AND ANAEROBIC 10CC   Final   Culture  Setup Time     Final   Value: 05/11/2013 14:59     Performed at [facility]   Culture     Final   Value:        BLOOD CULTURE RECEIVED NO GROWTH TO DATE CULTURE WILL BE HELD FOR 5 DAYS BEFORE ISSUING A FINAL NEGATIVE REPORT     Performed at [facility]   Report  Status PENDING   Incomplete     Studies:  Recent x-ray studies have been reviewed in detail by the Attending Physician  ELLIS,ALLISON L., ANP [phone] 05/15/2013, 12:00 PM  I have personally examined this patient and reviewed the entire database. I have reviewed the above note, made any necessary editorial changes, and agree with its content.  Lonia Blood, MD [facility]

## 2013-05-15 NOTE — Progress Notes (Signed)
INITIAL NUTRITION ASSESSMENT  DOCUMENTATION CODES Per approved criteria  -Severe malnutrition in the context of acute illness or injury   INTERVENTION:  TPN per pharmacy RD to follow for nutrition care plan  NUTRITION DIAGNOSIS: Inadequate oral intake related to severe pancreatitis as evidenced by NPO status   Goal: TPN to meet > 90% of estimated nutrition needs  Monitor:  TPN prescription, PO diet advancement, weight, labs, I/O's  Reason for Assessment: Consult   47 y.o. male  Admitting Dx: abdominal pain  ASSESSMENT: Patient with PMH of DM and HTN admitted for choledocholithiasis, cholelithiasis and pancreatitis; symptoms started 3 weeks ago and he thought it was as a result of GERD, but it was unresponsive to Zantac; CT showed possible necrosis but surgery is delayed until his gallstone pancreatitis improves.  Patient s/p procedure 12/17: ERCP WITH STENT PLACEMENT  Patient reports eating well PTA.  States he's had a 7 kg weight loss.  NGT in place.  NPO since admission.  RD consulted for new TPN.  Patient is receiving TPN with Clinimix E 5/15 @ 40 ml/hr and lipids @ 5 ml/hr. Provides 922 kcal, 48 grams protein per day. Meets 41% minimum estimated energy needs and 42% minimum estimated protein needs.  Patient meets criteria for severe malnutrition in the context of acute illness as evidenced by < 50% intake of estimated energy requirement for > 5 days and 8% weight loss x 1 week.  Height: Ht Readings from Last 1 Encounters:  05/09/13 5\' 6"  (1.676 m)    Weight: Wt Readings from Last 1 Encounters:  05/12/13 166 lb 10.7 oz (75.6 kg)    Ideal Body Weight: 142 lb  % Ideal Body Weight: 117%  Wt Readings from Last 10 Encounters:  05/12/13 166 lb 10.7 oz (75.6 kg)  05/12/13 166 lb 10.7 oz (75.6 kg)    Usual Body Weight: 181 lb  % Usual Body Weight: 92%  BMI:  Body mass index is 26.91 kg/(m^2).  Estimated Nutritional Needs: Kcal: 2250-2450 Protein: 115-125  gm Fluid: per MD  Skin: Intact  Diet Order: NPO  EDUCATION NEEDS: -No education needs identified at this time   Intake/Output Summary (Last 24 hours) at 05/15/13 1115 Last data filed at 05/15/13 0000  Gross per 24 hour  Intake 1510.42 ml  Output   1326 ml  Net 184.42 ml    Last BM: 12/21  Labs:   Recent Labs Lab 05/09/13 1232 05/09/13 1908  05/13/13 0335 05/14/13 0350 05/15/13 0443  NA 136  --   < > 143 145 141  K 2.9*  --   < > 4.4 3.5 4.3  CL 96  --   < > 111 103 101  CO2 24  --   < > 26 31 30   BUN 14  --   < > 21 22 21   CREATININE 1.38*  --   < > 1.18 1.10 1.06  CALCIUM 9.6  --   < > 7.8* 8.1* 8.0*  MG  --  1.8  --   --   --   --   GLUCOSE 201*  --   < > 145* 128* 128*  < > = values in this interval not displayed.  CBG (last 3)   Recent Labs  05/14/13 2357 05/15/13 0408 05/15/13 0729  GLUCAP 123* 126* 128*    Scheduled Meds: . famotidine (PEPCID) IV  20 mg Intravenous Q12H  . heparin subcutaneous  5,000 Units Subcutaneous Q8H  . imipenem-cilastatin  500 mg Intravenous  Q6H  . insulin aspart  0-15 Units Subcutaneous Q4H  . ketorolac  15 mg Intravenous Q6H  . metoprolol  5 mg Intravenous Q6H    Continuous Infusions: . sodium chloride 125 mL/hr at 05/14/13 1355  . sodium chloride    . Marland KitchenTPN (CLINIMIX-E) Adult     And  . fat emulsion      Past Medical History  Diagnosis Date  . Diabetes mellitus without complication   . Hypertension   . Cholecystitis 04/2013  . Fatty liver   . Pancreatitis     Past Surgical History  Procedure Laterality Date  . No past surgeries    . Ercp N/A 05/10/2013    Procedure: ENDOSCOPIC RETROGRADE CHOLANGIOPANCREATOGRAPHY (ERCP);  Surgeon: Theda Belfast, MD;  Location: [facility] ENDOSCOPY;  Service: Endoscopy;  Laterality: N/A;    Maureen Chatters, RD, LDN Pager #: [phone] After-Hours Pager #: [phone]

## 2013-05-15 NOTE — Progress Notes (Signed)
PARENTERAL NUTRITION CONSULT NOTE - INITIAL  Pharmacy Consult:  TPN Indication:  Severe pancreatitis  No Known Allergies  Patient Measurements: Height: 5\' 6"  (167.6 cm) Weight: 166 lb 10.7 oz (75.6 kg) IBW/kg (Calculated) : 63.8  Vital Signs: Temp: 98.5 F (36.9 C) (12/22 0727) Temp src: Oral (12/22 0727) BP: 163/90 mmHg (12/22 0727) Pulse Rate: 88 (12/22 0727) Intake/Output from previous day: 12/21 0701 - 12/22 0700 In: 1510.4 [I.V.:1260.4; IV Piggyback:250] Out: 1626 [Urine:1325; Emesis/NG output:300; Stool:1]  Labs:  Recent Labs  05/13/13 0335 05/14/13 0350 05/15/13 0443  WBC 13.8* 15.5* 13.8*  HGB 12.0* 12.5* 11.6*  HCT 37.6* 39.5 36.0*  PLT 120* 141* 142*     Recent Labs  05/13/13 0335 05/14/13 0350 05/15/13 0443  NA 143 145 141  K 4.4 3.5 4.3  CL 111 103 101  CO2 26 31 30   GLUCOSE 145* 128* 128*  BUN 21 22 21   CREATININE 1.18 1.10 1.06  CALCIUM 7.8* 8.1* 8.0*  PROT 6.4 6.7 6.1  ALBUMIN 2.7* 2.8* 2.4*  AST 42* 30 23  ALT 104* 79* 51  ALKPHOS 115 172* 102  BILITOT 1.8* 1.8* 1.7*   Estimated Creatinine Clearance: 79.4 ml/min (by C-G formula based on Cr of 1.06).    Recent Labs  05/14/13 2006 05/14/13 2357 05/15/13 0408  GLUCAP 121* 123* 126*    Medical History: Past Medical History  Diagnosis Date  . Diabetes mellitus without complication   . Hypertension   . Cholecystitis 04/2013  . Fatty liver   . Pancreatitis        Insulin Requirements in the past 24 hours:  12 units SSI  Assessment: 47 YOM admitted 05/09/13 with abdominal pain from pancreatitis due to choledocholithiasis and is now s/p ERCP and stenting on 05/10/13.  Postoperatively, he experienced abdominal distention and pain.  CT showed possible necrosis but surgery is delayed until his gallstone pancreatitis improves.  Pharmacy consulted to manage TPN for severe/necrotizing pancreatitis and prolonged NPO status.  GI: severe/necrotizing pancreatitis, NGT placed 12/20,  NPO since 12/16 - lipase down to WNL, NG O/P unchanged at - on Pepcid Endo: DM, CBGs well controlled Lytes: all WNL Renal: SCr down 1.06, CrCL 79 ml/min, good UOP, 1/2NS at 125 ml/hr Pulm: Gate >> RA Cards: HTN / HLD - BP elevated, HR normal - on IV Lopressor Hepatobil: LFTs WNL, tbili down to 1.7  Neuro: Toradol + PRN Dilaudid - GCS 15, CPOT 4 ID: Primaxin D#2, s/p Unasyn, afebrile, WBC down 13.8, cultures NGTD Best Practices: heparin SQ TPN Access: PICC line to be placed today TPN day#: 0 (12/22 >> )  Current Nutrition:  NPO  Nutritional Goals:  1650-1800 kCal, 90-100 grams of protein per day   Plan:   - Initiate Clinimix E 5/15 at 40 ml/hr + IVFE 20% at 5 ml/hr - Daily multivitamin and trace elements - Continue SSI as ordered - Decrease IVF to 85 ml/hr once TPN starts at 1800 - Standard TPN labs    Colleena Kurtenbach D. Laney Potash, PharmD, BCPS Pager:  [phone] 05/15/2013, 9:11 AM

## 2013-05-15 NOTE — Progress Notes (Signed)
Patient Name: Nicolas Chandler Date of Encounter: 05/15/2013     Active Problems:   Choledocholithiasis   Abdominal pain   Leukocytosis   Hypokalemia   AKI (acute kidney injury)   DM (diabetes mellitus)   HTN (hypertension)   HLD (hyperlipidemia)   Pancreatitis   Elevated LFTs   Hepatic steatosis   Renal cyst   Thrombocytopenia, unspecified    SUBJECTIVE  No chest pain or dyspnea. Rhythm normal sinus rhythm on IV metoprolol. Not yet ready for surgery from GI standpoint.  CURRENT MEDS . famotidine (PEPCID) IV  20 mg Intravenous Q12H  . heparin subcutaneous  5,000 Units Subcutaneous Q8H  . imipenem-cilastatin  500 mg Intravenous Q6H  . insulin aspart  0-15 Units Subcutaneous Q4H  . ketorolac  15 mg Intravenous Q6H  . metoprolol  5 mg Intravenous Q6H    OBJECTIVE  Filed Vitals:   05/15/13 0200 05/15/13 0300 05/15/13 0402 05/15/13 0727  BP: 155/85 161/86 161/79 163/90  Pulse: 86 84 78 88  Temp:   98.6 F (37 C) 98.5 F (36.9 C)  TempSrc:   Oral Oral  Resp: 16 19 23 17   Height:      Weight:      SpO2: 92% 98% 98% 94%    Intake/Output Summary (Last 24 hours) at 05/15/13 0839 Last data filed at 05/15/13 0000  Gross per 24 hour  Intake 1510.42 ml  Output   1626 ml  Net -115.58 ml   Filed Weights   05/09/13 1700 05/10/13 1525 05/12/13 0415  Weight: 157 lb 6.5 oz (71.4 kg) 160 lb 15 oz (73 kg) 166 lb 10.7 oz (75.6 kg)    PHYSICAL EXAM  General: Pleasant, NAD. Neuro: Alert and oriented X 3. Moves all extremities spontaneously. Psych: Normal affect. HEENT:  Normal  Neck: Supple without bruits or JVD. Lungs:  Resp regular and unlabored, CTA. Heart: RRR no s3, s4, or murmurs. Abdomen: Soft, non-tender, non-distended, BS + x 4.  Extremities: No clubbing, cyanosis or edema. DP/PT/Radials 2+ and equal bilaterally.  Accessory Clinical Findings  CBC  Recent Labs  05/14/13 0350 05/15/13 0443  WBC 15.5* 13.8*  HGB 12.5* 11.6*  HCT 39.5 36.0*  MCV 96.1  94.7  PLT 141* 142*   Basic Metabolic Panel  Recent Labs  05/14/13 0350 05/15/13 0443  NA 145 141  K 3.5 4.3  CL 103 101  CO2 31 30  GLUCOSE 128* 128*  BUN 22 21  CREATININE 1.10 1.06  CALCIUM 8.1* 8.0*   Liver Function Tests  Recent Labs  05/14/13 0350 05/15/13 0443  AST 30 23  ALT 79* 51  ALKPHOS 172* 102  BILITOT 1.8* 1.7*  PROT 6.7 6.1  ALBUMIN 2.8* 2.4*    Recent Labs  05/13/13 0335 05/15/13 0443  LIPASE 148* 53   Cardiac Enzymes No results found for this basename: CKTOTAL, CKMB, CKMBINDEX, TROPONINI,  in the last 72 hours BNP No components found with this basename: POCBNP,  D-Dimer No results found for this basename: DDIMER,  in the last 72 hours Hemoglobin A1C No results found for this basename: HGBA1C,  in the last 72 hours Fasting Lipid Panel No results found for this basename: CHOL, HDL, LDLCALC, TRIG, CHOLHDL, LDLDIRECT,  in the last 72 hours Thyroid Function Tests No results found for this basename: TSH, T4TOTAL, FREET3, T3FREE, THYROIDAB,  in the last 72 hours  TELE  Normal sinus rhythm  ECG    Radiology/Studies  Dg Chest 1 View  05/10/2013  CLINICAL DATA:  Fever status post ERCP  EXAM: CHEST - 1 VIEW  COMPARISON:  None.  FINDINGS: Cardiac shadow is within normal limits. The overall inspiratory effort is poor. Mild atelectatic changes are noted in the bases. No pneumothorax is seen. The upper abdomen shows gaseous distension of the colon.  IMPRESSION: Mild bibasilar atelectatic changes.   Electronically Signed   By: Alcide Clever M.D.   On: 05/10/2013 17:14   Dg Abd 1 View  05/10/2013   CLINICAL DATA:  Status post ERCP with abdominal pain  EXAM: ABDOMEN - 1 VIEW  COMPARISON:  None.  FINDINGS: Scattered large and small bowel gas is noted. There is gaseous distension of the: Likely related to the recent ERCP. Contrast material is noted within the right colon and appendix also related to the recent ERCP. A biliary stent is noted in the  right upper quadrant. A small amount of biliary air is seen consistent with stent placement. No all evidence of free intraperitoneal air is seen.  IMPRESSION: Changes consistent with a recent ERCP. No acute abnormality is noted.   Electronically Signed   By: Alcide Clever M.D.   On: 05/10/2013 17:15   US Abdomen Complete  05/09/2013   CLINICAL DATA:  Right upper quadrant pain.  EXAM: ULTRASOUND ABDOMEN COMPLETE  COMPARISON:  None.  FINDINGS: Gallbladder:  There are multiple echogenic stones in the gallbladder. Gallbladder wall is mildly thickened, measuring 0.3 cm. There is hypoechoic material in the right upper quadrant suggestive for complex fluid. The complex fluid extends into the right flank region. There is simple appearing fluid around the gallbladder. Reportedly, the patient does not have a sonographic Murphy's sign but the patient has received pain medication.  Common bile duct:  Diameter: 0.5 cm  Liver:  Increased echogenicity of the liver suggests hepatic steatosis. The liver parenchyma is also heterogeneous with loss of the internal architecture.  IVC:  Not well seen.  Pancreas:  Not well seen.  Spleen:  Size and appearance within normal limits. Spleen measures 6.4 cm in length.  Right Kidney:  Length: 10.5 cm. Echogenicity within normal limits. No mass or hydronephrosis visualized.  Left Kidney:  Length: 10.8 cm. Echogenicity within normal limits. No mass or hydronephrosis visualized.  Abdominal aorta:  No aneurysm visualized.  Other findings:  None.  IMPRESSION: Cholelithiasis with mild gallbladder wall thickening. There is complex fluid in the right upper quadrant suggesting an acute inflammatory process.  Hepatic steatosis.   Electronically Signed   By: Richarda Overlie M.D.   On: 05/09/2013 15:25   Ct Abdomen Pelvis W Contrast  05/13/2013   CLINICAL DATA:  Abdominal distention, abdominal pain.  Pancreatitis.  EXAM: CT ABDOMEN AND PELVIS WITH CONTRAST  TECHNIQUE: Multidetector CT imaging of the  abdomen and pelvis was performed using the standard protocol following bolus administration of intravenous contrast.  CONTRAST:  OMNIPAQUE IOHEXOL 300 MG/ML  SOLN  COMPARISON:  05/09/2013  FINDINGS: There are small bilateral pleural effusions. Lower lobe atelectasis or infiltrates. Heart is normal size.  Mild diffuse fatty infiltration of the liver. Layering gallstones within the gallbladder. Spleen, adrenals and left kidney unremarkable. Small cyst in the midpole of the right kidney.  Again noted is enlargement and significant inflammation surrounding the pancreas compatible with acute pancreatitis. There are areas of poor enhancement within the pancreatic body and tail. This could simply represent fluid and developing fluid collections, but cannot exclude areas of necrosis.  Interval placement of biliary stent. This extends from the mid  to distal common bile duct into the small bowel. Continued fluid collections in the left anterior para renal space. Fluid/ascites around the liver.  There is gaseous distention of the colon and to a lesser extent small bowel. This likely reflects ileus. Aorta and branch vessels are patent and unremarkable. Urinary bladder is mildly distended, grossly unremarkable. No free air or adenopathy.  IMPRESSION: Worsening changes of acute pancreatitis with increasing inflammation around the pancreas. New infiltrating areas of low density throughout the pancreas. This could represent developing fluid collections or areas of necrosis.  Perihepatic ascites.  Cholelithiasis.  Diffuse fatty infiltration of the liver.  Small bilateral pleural effusions with bibasilar atelectasis or infiltrates.  Suspect ileus.  These results will be called to the ordering clinician or representative by the Radiologist Assistant, and communication documented in the PACS Dashboard.   Electronically Signed   By: Charlett Nose M.D.   On: 05/13/2013 13:45   Ct Abdomen Pelvis W Contrast  05/09/2013   CLINICAL  DATA:  Abdominal pain.  EXAM: CT ABDOMEN AND PELVIS WITH CONTRAST  TECHNIQUE: Multidetector CT imaging of the abdomen and pelvis was performed using the standard protocol following bolus administration of intravenous contrast.  CONTRAST:  80mL OMNIPAQUE IOHEXOL 300 MG/ML  SOLN  COMPARISON:  Ultrasound 05/09/2013  FINDINGS: Lung bases are clear.  There is no evidence for free air.  Decreased attenuation of the liver is consistent with hepatic steatosis. There is a small amount of fluid along the right hepatic dome. There is fluid around the inferior gallbladder and the duodenum. There are multiple gallstones. The gallbladder wall is dense and could represent hyperemia. Portal venous system is patent. There is marked inflammation involving the descending duodenum. There is wall thickening along the medial duodenum wall, measuring up to 1.6 cm. Multiple calcifications in the distal common bile duct are suggestive for choledocholithiasis. There is no significant common bile duct dilatation. The base of the gallbladder or the cystic duct is distended, measuring up to 1.5 cm, and containing multiple stones. There is fluid or edema in the porta hepatitis. There is fluid and edema tracking around the pancreas. Fluid extends into the left upper quadrant and adjacent to the stomach and spleen. No significant pancreatic duct dilatation. Normal appearance of the spleen.  There is a 1.7 cm low-density structure along the right kidney upper pole. The Hounsfield units roughly measure 39 and not consistent with a simple cyst. However, the Hounsfield units do not significantly change on the delayed images and probably represents a proteinaceous or hemorrhagic cyst. There is a small cyst along the medial right kidney. Question a punctate stone in the right kidney without hydronephrosis. There are two nonobstructive left kidney stones.  No gross abnormality to the prostate, seminal vesicles or urinary bladder. No significant  lymphadenopathy. No acute inflammation involving the appendix. Normal appearance of the jejunum and ileum. No gross abnormality to the colon.  Bilateral pars defects at L5. No significant anterolisthesis at L5-S1.  IMPRESSION: There is extensive fluid and inflammation in the right upper quadrant, centered around the duodenum. There is marked asymmetric duodenal wall thickening with stones in the distal common bile duct. Evidence for pancreatitis.  Multiple gallstones and cannot exclude cholecystitis.  Decreased attenuation of the liver suggests hepatic steatosis.  Nonobstructive kidney stones.  1.7 cm indeterminate right renal structure. This probably represents a hyperdense or proteinaceous renal cyst but indeterminate. Consider follow-up ultrasound after the acute process has resolved. This area was not well evaluated on today's ultrasound.  These results were called by telephone at the time of interpretation on 05/09/2013 at 3:43 PM to Dr. Emilia Beck , who verbally acknowledged these results.   Electronically Signed   By: Richarda Overlie M.D.   On: 05/09/2013 15:47   Dg Chest Port 1 View  05/12/2013   CLINICAL DATA:  Hypoxia.  EXAM: PORTABLE CHEST - 1 VIEW  COMPARISON:  05/10/2013.  FINDINGS: Poor inspiration. Bibasilar atelectasis. Heart size normal. Pulmonary vascularity normal. No pleural effusion or pneumothorax. No acute osseous abnormality.  IMPRESSION: Poor inspiration with bibasilar atelectasis.   Electronically Signed   By: Maisie Fus  Register   On: 05/12/2013 08:15   Dg Ercp Biliary & Pancreatic Ducts  05/10/2013   CLINICAL DATA:  Biliary stent placement.  Common duct stone  EXAM: ERCP  TECHNIQUE: Multiple spot images obtained with the fluoroscopic device and submitted for interpretation post-procedure.  COMPARISON:  CT 05/09/2013  FINDINGS: Three digital images were obtained. There is partial opacification of the bile ducts with contrast. Question small common duct stones. Biliary tree not  completely evaluated for stones. No obstruction  Plastic common bile duct stent placed in good position.  IMPRESSION: Common bile duct stent placed at ERCP.  These images were submitted for radiologic interpretation only. Please see the procedural report for the amount of contrast and the fluoroscopy time utilized.   Electronically Signed   By: Marlan Palau M.D.   On: 05/10/2013 15:05   Dg Abd 2 Views  05/11/2013   CLINICAL DATA:  Abdominal pain and distention.  ERCP yesterday.  EXAM: ABDOMEN - 2 VIEW  COMPARISON:  Radiograph dated 05/10/2013  FINDINGS: Common bile duct stent is in place, unchanged. There is atelectasis at both lung bases. No free air. There is air and contrast throughout the nondistended colon, similar to the appearance on the prior study. No dilated small bowel.  IMPRESSION: No evidence of free air. No significant change since the prior exam. Slight bibasilar atelectasis.   Electronically Signed   By: Geanie Cooley M.D.   On: 05/11/2013 18:17   Dg Abd Portable 1v  05/14/2013   CLINICAL DATA:  Ileus.  EXAM: PORTABLE ABDOMEN - 1 VIEW  COMPARISON:  05/13/2013.  FINDINGS: Again noted is moderate gaseous distention throughout the colon, particularly in the transverse colon; this is nonspecific. The residual oral contrast material remains in the colon, predominantly of ascending colon, opacifying the appendix. Several nondilated loops of gas-filled small bowel are noted in the central abdomen. A nasogastric tube is seen coiled in the proximal stomach. No definite pneumoperitoneum on this single supine view.  IMPRESSION: 1. Nonspecific, nonobstructive bowel gas pattern. 2. No pneumoperitoneum. 3. Nasogastric tube is coiled in the proximal stomach.   Electronically Signed   By: Trudie Reed M.D.   On: 05/14/2013 07:28   Dg Abd Portable 1v  05/13/2013   CLINICAL DATA:  Evaluate nasogastric tube placement.  EXAM: PORTABLE ABDOMEN - 1 VIEW  COMPARISON:  Abdominal radiograph 05/11/2013.   FINDINGS: There has been interval placement of a nasogastric tube with tip in the body of the stomach and side port just distal to the gastroesophageal junction. Gaseous distention throughout the colon is noted. Multiple nondilated gas-filled loops of small bowel are identified. No pneumoperitoneum.  IMPRESSION: 1. Tip of nasogastric tube is in the mid body of the stomach with side port near the gastroesophageal junction.   Electronically Signed   By: Trudie Reed M.D.   On: 05/13/2013 15:16    ASSESSMENT AND  PLAN  1. Gallstone pancreatitis  2. sinus tachycardia secondary to medical issues.  Continue IV metoprolol.  Signed, Cassell Clement  MD

## 2013-05-15 NOTE — Progress Notes (Signed)
[facility] Progress Note 5 Days Post-Op  Subjective: Patient is still very sore in the upper abdomen and epigastrium.  Has NGT in place.  Not relieving discomfort much.  Has had not nutrition for about one week.  Objective: Vital signs in last 24 hours: Temp:  [97.4 F (36.3 C)-98.6 F (37 C)] 98.5 F (36.9 C) (12/22 0727) Pulse Rate:  [77-102] 88 (12/22 0727) Resp:  [12-29] 17 (12/22 0727) BP: (129-179)/(76-106) 163/90 mmHg (12/22 0727) SpO2:  [92 %-100 %] 94 % (12/22 0727) Last BM Date: 05/13/13  Intake/Output from previous day: 12/21 0701 - 12/22 0700 In: 1510.4 [I.V.:1260.4; IV Piggyback:250] Out: 1626 [Urine:1325; Emesis/NG output:300; Stool:1] Intake/Output this shift:    General: No acute distress but clearly uncomfortable  Lungs: Clear  Abd: Distended, has bowel sounds. No peritonitis.  No rebound or but guards in the upper abdomen  Extremities: No DVT signs or symptoms  Neuro: Intact  Lab Results:  @LABLAST2 (wbc:2,hgb:2,hct:2,plt:2) BMET  Recent Labs  05/14/13 0350 05/15/13 0443  NA 145 141  K 3.5 4.3  CL 103 101  CO2 31 30  GLUCOSE 128* 128*  BUN 22 21  CREATININE 1.10 1.06  CALCIUM 8.1* 8.0*   PT/INR No results found for this basename: LABPROT, INR,  in the last 72 hours ABG No results found for this basename: PHART, PCO2, PO2, HCO3,  in the last 72 hours  Studies/Results: Ct Abdomen Pelvis W Contrast  05/13/2013   CLINICAL DATA:  Abdominal distention, abdominal pain.  Pancreatitis.  EXAM: CT ABDOMEN AND PELVIS WITH CONTRAST  TECHNIQUE: Multidetector CT imaging of the abdomen and pelvis was performed using the standard protocol following bolus administration of intravenous contrast.  CONTRAST:  OMNIPAQUE IOHEXOL 300 MG/ML  SOLN  COMPARISON:  05/09/2013  FINDINGS: There are small bilateral pleural effusions. Lower lobe atelectasis or infiltrates. Heart is normal size.  Mild diffuse fatty infiltration of the liver. Layering gallstones within  the gallbladder. Spleen, adrenals and left kidney unremarkable. Small cyst in the midpole of the right kidney.  Again noted is enlargement and significant inflammation surrounding the pancreas compatible with acute pancreatitis. There are areas of poor enhancement within the pancreatic body and tail. This could simply represent fluid and developing fluid collections, but cannot exclude areas of necrosis.  Interval placement of biliary stent. This extends from the mid to distal common bile duct into the small bowel. Continued fluid collections in the left anterior para renal space. Fluid/ascites around the liver.  There is gaseous distention of the colon and to a lesser extent small bowel. This likely reflects ileus. Aorta and branch vessels are patent and unremarkable. Urinary bladder is mildly distended, grossly unremarkable. No free air or adenopathy.  IMPRESSION: Worsening changes of acute pancreatitis with increasing inflammation around the pancreas. New infiltrating areas of low density throughout the pancreas. This could represent developing fluid collections or areas of necrosis.  Perihepatic ascites.  Cholelithiasis.  Diffuse fatty infiltration of the liver.  Small bilateral pleural effusions with bibasilar atelectasis or infiltrates.  Suspect ileus.  These results will be called to the ordering clinician or representative by the Radiologist Assistant, and communication documented in the PACS Dashboard.   Electronically Signed   By: Charlett Nose M.D.   On: 05/13/2013 13:45   Dg Abd Portable 1v  05/14/2013   CLINICAL DATA:  Ileus.  EXAM: PORTABLE ABDOMEN - 1 VIEW  COMPARISON:  05/13/2013.  FINDINGS: Again noted is moderate gaseous distention throughout the colon, particularly in the transverse colon;  this is nonspecific. The residual oral contrast material remains in the colon, predominantly of ascending colon, opacifying the appendix. Several nondilated loops of gas-filled small bowel are noted in the  central abdomen. A nasogastric tube is seen coiled in the proximal stomach. No definite pneumoperitoneum on this single supine view.  IMPRESSION: 1. Nonspecific, nonobstructive bowel gas pattern. 2. No pneumoperitoneum. 3. Nasogastric tube is coiled in the proximal stomach.   Electronically Signed   By: Trudie Reed M.D.   On: 05/14/2013 07:28   Dg Abd Portable 1v  05/13/2013   CLINICAL DATA:  Evaluate nasogastric tube placement.  EXAM: PORTABLE ABDOMEN - 1 VIEW  COMPARISON:  Abdominal radiograph 05/11/2013.  FINDINGS: There has been interval placement of a nasogastric tube with tip in the body of the stomach and side port just distal to the gastroesophageal junction. Gaseous distention throughout the colon is noted. Multiple nondilated gas-filled loops of small bowel are identified. No pneumoperitoneum.  IMPRESSION: 1. Tip of nasogastric tube is in the mid body of the stomach with side port near the gastroesophageal junction.   Electronically Signed   By: Trudie Reed M.D.   On: 05/13/2013 15:16    Anti-infectives: Anti-infectives   Start     Dose/Rate Route Frequency Ordered Stop   05/14/13 1330  imipenem-cilastatin (PRIMAXIN) 500 mg in sodium chloride 0.9 % 100 mL IVPB     500 mg 200 mL/hr over 30 Minutes Intravenous Every 6 hours 05/14/13 1247     05/09/13 2200  Ampicillin-Sulbactam (UNASYN) 3 g in sodium chloride 0.9 % 100 mL IVPB  Status:  Discontinued     3 g 100 mL/hr over 60 Minutes Intravenous Every 6 hours 05/09/13 1733 05/13/13 1821   05/09/13 1630  Ampicillin-Sulbactam (UNASYN) 3 g in sodium chloride 0.9 % 100 mL IVPB     3 g 100 mL/hr over 60 Minutes Intravenous  Once 05/09/13 1605 05/09/13 1721      Assessment/Plan: s/p Procedure(s): ENDOSCOPIC RETROGRADE CHOLANGIOPANCREATOGRAPHY (ERCP) Patient has moderated to severe pancreatitis and not ready for surgery from my standpoint.  He needs nutrition and for his clinical pancreatitis to improve before surgery to remove his  gallbladder is performed.  Will continue to follow. PICC line and TPN May need repeat CT scan in a couple of days if symptoms do not improve.  LOS: 6 days   Marta Lamas. Gae Bon, MD, FACS [phone] [facility] 05/15/2013

## 2013-05-15 NOTE — Progress Notes (Signed)
Peripherally Inserted Central Catheter/Midline Placement  The IV Nurse has discussed with the patient and/or persons authorized to consent for the patient, the purpose of this procedure and the potential benefits and risks involved with this procedure.  The benefits include less needle sticks, lab draws from the catheter and patient may be discharged home with the catheter.  Risks include, but not limited to, infection, bleeding, blood clot (thrombus formation), and puncture of an artery; nerve damage and irregular heat beat.  Alternatives to this procedure were also discussed.  PICC/Midline Placement Documentation        Lisabeth Devoid 05/15/2013, 12:23 PM Consent obtained by Lazarus Gowda, RN

## 2013-05-15 NOTE — Progress Notes (Signed)
Subjective: Feeling better.  Pain levels ranges from a 7-9/10  Objective: Vital signs in last 24 hours: Temp:  [97.4 F (36.3 C)-98.6 F (37 C)] 98.6 F (37 C) (12/22 0402) Pulse Rate:  [77-102] 78 (12/22 0402) Resp:  [12-29] 23 (12/22 0402) BP: (129-179)/(76-106) 161/79 mmHg (12/22 0402) SpO2:  [92 %-100 %] 98 % (12/22 0402) Last BM Date: 05/13/13  Intake/Output from previous day: 12/21 0701 - 12/22 0700 In: 1510.4 [I.V.:1260.4; IV Piggyback:250] Out: 1626 [Urine:1325; Emesis/NG output:300; Stool:1] Intake/Output this shift:    General appearance: alert and fatigued GI: softer, +BS  Lab Results:  Recent Labs  05/13/13 0335 05/14/13 0350 05/15/13 0443  WBC 13.8* 15.5* 13.8*  HGB 12.0* 12.5* 11.6*  HCT 37.6* 39.5 36.0*  PLT 120* 141* 142*   BMET  Recent Labs  05/13/13 0335 05/14/13 0350 05/15/13 0443  NA 143 145 141  K 4.4 3.5 4.3  CL 111 103 101  CO2 26 31 30   GLUCOSE 145* 128* 128*  BUN 21 22 21   CREATININE 1.18 1.10 1.06  CALCIUM 7.8* 8.1* 8.0*   LFT  Recent Labs  05/15/13 0443  PROT 6.1  ALBUMIN 2.4*  AST 23  ALT 51  ALKPHOS 102  BILITOT 1.7*   PT/INR No results found for this basename: LABPROT, INR,  in the last 72 hours Hepatitis Panel No results found for this basename: HEPBSAG, HCVAB, HEPAIGM, HEPBIGM,  in the last 72 hours C-Diff No results found for this basename: CDIFFTOX,  in the last 72 hours Fecal Lactopherrin No results found for this basename: FECLLACTOFRN,  in the last 72 hours  Studies/Results: Ct Abdomen Pelvis W Contrast  05/13/2013   CLINICAL DATA:  Abdominal distention, abdominal pain.  Pancreatitis.  EXAM: CT ABDOMEN AND PELVIS WITH CONTRAST  TECHNIQUE: Multidetector CT imaging of the abdomen and pelvis was performed using the standard protocol following bolus administration of intravenous contrast.  CONTRAST:  OMNIPAQUE IOHEXOL 300 MG/ML  SOLN  COMPARISON:  05/09/2013  FINDINGS: There are small bilateral pleural  effusions. Lower lobe atelectasis or infiltrates. Heart is normal size.  Mild diffuse fatty infiltration of the liver. Layering gallstones within the gallbladder. Spleen, adrenals and left kidney unremarkable. Small cyst in the midpole of the right kidney.  Again noted is enlargement and significant inflammation surrounding the pancreas compatible with acute pancreatitis. There are areas of poor enhancement within the pancreatic body and tail. This could simply represent fluid and developing fluid collections, but cannot exclude areas of necrosis.  Interval placement of biliary stent. This extends from the mid to distal common bile duct into the small bowel. Continued fluid collections in the left anterior para renal space. Fluid/ascites around the liver.  There is gaseous distention of the colon and to a lesser extent small bowel. This likely reflects ileus. Aorta and branch vessels are patent and unremarkable. Urinary bladder is mildly distended, grossly unremarkable. No free air or adenopathy.  IMPRESSION: Worsening changes of acute pancreatitis with increasing inflammation around the pancreas. New infiltrating areas of low density throughout the pancreas. This could represent developing fluid collections or areas of necrosis.  Perihepatic ascites.  Cholelithiasis.  Diffuse fatty infiltration of the liver.  Small bilateral pleural effusions with bibasilar atelectasis or infiltrates.  Suspect ileus.  These results will be called to the ordering clinician or representative by the Radiologist Assistant, and communication documented in the PACS Dashboard.   Electronically Signed   By: Charlett Nose M.D.   On: 05/13/2013 13:45  Dg Abd Portable 1v  05/14/2013   CLINICAL DATA:  Ileus.  EXAM: PORTABLE ABDOMEN - 1 VIEW  COMPARISON:  05/13/2013.  FINDINGS: Again noted is moderate gaseous distention throughout the colon, particularly in the transverse colon; this is nonspecific. The residual oral contrast material  remains in the colon, predominantly of ascending colon, opacifying the appendix. Several nondilated loops of gas-filled small bowel are noted in the central abdomen. A nasogastric tube is seen coiled in the proximal stomach. No definite pneumoperitoneum on this single supine view.  IMPRESSION: 1. Nonspecific, nonobstructive bowel gas pattern. 2. No pneumoperitoneum. 3. Nasogastric tube is coiled in the proximal stomach.   Electronically Signed   By: Trudie Reed M.D.   On: 05/14/2013 07:28   Dg Abd Portable 1v  05/13/2013   CLINICAL DATA:  Evaluate nasogastric tube placement.  EXAM: PORTABLE ABDOMEN - 1 VIEW  COMPARISON:  Abdominal radiograph 05/11/2013.  FINDINGS: There has been interval placement of a nasogastric tube with tip in the body of the stomach and side port just distal to the gastroesophageal junction. Gaseous distention throughout the colon is noted. Multiple nondilated gas-filled loops of small bowel are identified. No pneumoperitoneum.  IMPRESSION: 1. Tip of nasogastric tube is in the mid body of the stomach with side port near the gastroesophageal junction.   Electronically Signed   By: Trudie Reed M.D.   On: 05/13/2013 15:16    Medications:  Scheduled: . famotidine (PEPCID) IV  20 mg Intravenous Q12H  . heparin subcutaneous  5,000 Units Subcutaneous Q8H  . imipenem-cilastatin  500 mg Intravenous Q6H  . insulin aspart  0-15 Units Subcutaneous Q4H  . ketorolac  15 mg Intravenous Q6H  . metoprolol  5 mg Intravenous Q6H   Continuous: . sodium chloride 125 mL/hr at 05/14/13 1355    Assessment/Plan: 1) Acute severe gallstone pancreatitis. 2) Cholelithiasis.   I reviewed the CT scan.  There is the possibility of necrosis, but the areas are small.  Overall he is progressing, but slowly.  Creatinine has normalized and he is respiring well.  Plan: 1) Continue with IV hydration. 2) Continue with Imipenem. 3) Continue with pain control.   LOS: 6 days   Teresa Lemmerman  D 05/15/2013, 7:30 AM

## 2013-05-16 DIAGNOSIS — M7989 Other specified soft tissue disorders: Secondary | ICD-10-CM

## 2013-05-16 LAB — CBC
HCT: 33.2 % — ABNORMAL LOW (ref 39.0–52.0)
Hemoglobin: 11.2 g/dL — ABNORMAL LOW (ref 13.0–17.0)
MCV: 92.2 fL (ref 78.0–100.0)
RBC: 3.6 MIL/uL — ABNORMAL LOW (ref 4.22–5.81)
WBC: 12 10*3/uL — ABNORMAL HIGH (ref 4.0–10.5)

## 2013-05-16 LAB — COMPREHENSIVE METABOLIC PANEL
AST: 23 U/L (ref 0–37)
BUN: 16 mg/dL (ref 6–23)
CO2: 27 mEq/L (ref 19–32)
Chloride: 102 mEq/L (ref 96–112)
Creatinine, Ser: 0.89 mg/dL (ref 0.50–1.35)
GFR calc non Af Amer: >90 mL/min (ref >90)
Glucose, Bld: 193 mg/dL — ABNORMAL HIGH (ref 70–99)
Total Bilirubin: 1.2 mg/dL (ref 0.3–1.2)
Total Protein: 5.7 g/dL — ABNORMAL LOW (ref 6.0–8.3)

## 2013-05-16 LAB — PREALBUMIN: Prealbumin: 5.4 mg/dL — ABNORMAL LOW (ref 17.0–34.0)

## 2013-05-16 LAB — DIFFERENTIAL
Basophils Relative: 0 % (ref 0–1)
Eosinophils Absolute: 0.2 10*3/uL (ref 0.0–0.7)
Lymphocytes Relative: 9 % — ABNORMAL LOW (ref 12–46)
Neutro Abs: 9.9 10*3/uL — ABNORMAL HIGH (ref 1.7–7.7)
WBC Morphology: INCREASED

## 2013-05-16 LAB — GLUCOSE, CAPILLARY
Glucose-Capillary: 182 mg/dL — ABNORMAL HIGH (ref 70–99)
Glucose-Capillary: 184 mg/dL — ABNORMAL HIGH (ref 70–99)
Glucose-Capillary: 188 mg/dL — ABNORMAL HIGH (ref 70–99)
Glucose-Capillary: 211 mg/dL — ABNORMAL HIGH (ref 70–99)

## 2013-05-16 LAB — PHOSPHORUS: Phosphorus: 2.4 mg/dL (ref 2.3–4.6)

## 2013-05-16 LAB — MAGNESIUM: Magnesium: 2.1 mg/dL (ref 1.5–2.5)

## 2013-05-16 LAB — TRIGLYCERIDES: Triglycerides: 157 mg/dL — ABNORMAL HIGH (ref <150)

## 2013-05-16 MED ORDER — SODIUM CHLORIDE 0.45 % IV SOLN
INTRAVENOUS | Status: DC
  Administered 2013-05-17 – 2013-05-18 (×2): via INTRAVENOUS

## 2013-05-16 MED ORDER — PROCHLORPERAZINE EDISYLATE 5 MG/ML IJ SOLN
10.0000 mg | Freq: Four times a day (QID) | INTRAMUSCULAR | Status: DC
  Administered 2013-05-16 – 2013-05-17 (×5): 10 mg via INTRAVENOUS
  Filled 2013-05-16 (×9): qty 2

## 2013-05-16 MED ORDER — FAT EMULSION 20 % IV EMUL
250.0000 mL | INTRAVENOUS | Status: AC
  Administered 2013-05-16: 250 mL via INTRAVENOUS
  Filled 2013-05-16: qty 250

## 2013-05-16 MED ORDER — POTASSIUM CHLORIDE 10 MEQ/50ML IV SOLN
10.0000 meq | INTRAVENOUS | Status: AC
  Administered 2013-05-16 (×4): 10 meq via INTRAVENOUS
  Filled 2013-05-16 (×4): qty 50

## 2013-05-16 MED ORDER — TRACE MINERALS CR-CU-F-FE-I-MN-MO-SE-ZN IV SOLN
INTRAVENOUS | Status: AC
  Administered 2013-05-16: 17:00:00 via INTRAVENOUS
  Filled 2013-05-16: qty 2000

## 2013-05-16 NOTE — Progress Notes (Signed)
Subjective: No acute events.  Feeling better each day.  ABM is softer.  Objective: Vital signs in last 24 hours: Temp:  [97.9 F (36.6 C)-98.9 F (37.2 C)] 98.9 F (37.2 C) (12/23 0400) Pulse Rate:  [85-96] 96 (12/23 0400) Resp:  [12-20] 16 (12/23 0400) BP: (142-163)/(81-99) 153/96 mmHg (12/23 0400) SpO2:  [94 %-99 %] 94 % (12/23 0400) Last BM Date: 05/15/13  Intake/Output from previous day: 12/22 0701 - 12/23 0700 In: 3775 [I.V.:2740; IV Piggyback:450; TPN:585] Out: 400 [Emesis/NG output:400] Intake/Output this shift: Total I/O In: 1690 [I.V.:945; IV Piggyback:250; TPN:495] Out: -   General appearance: alert and no distress GI: soft, non-tender; bowel sounds normal; no masses,  no organomegaly  Lab Results:  Recent Labs  05/14/13 0350 05/15/13 0443 05/16/13 0455  WBC 15.5* 13.8* 12.0*  HGB 12.5* 11.6* 11.2*  HCT 39.5 36.0* 33.2*  PLT 141* 142* 162   BMET  Recent Labs  05/14/13 0350 05/15/13 0443 05/16/13 0455  NA 145 141 141  K 3.5 4.3 3.3*  CL 103 101 102  CO2 31 30 27   GLUCOSE 128* 128* 193*  BUN 22 21 16   CREATININE 1.10 1.06 0.89  CALCIUM 8.1* 8.0* 7.6*   LFT  Recent Labs  05/16/13 0455  PROT 5.7*  ALBUMIN 2.2*  AST 23  ALT 37  ALKPHOS 97  BILITOT 1.2   PT/INR No results found for this basename: LABPROT, INR,  in the last 72 hours Hepatitis Panel No results found for this basename: HEPBSAG, HCVAB, HEPAIGM, HEPBIGM,  in the last 72 hours C-Diff No results found for this basename: CDIFFTOX,  in the last 72 hours Fecal Lactopherrin No results found for this basename: FECLLACTOFRN,  in the last 72 hours  Studies/Results: No results found.  Medications:  Scheduled: . famotidine (PEPCID) IV  20 mg Intravenous Q12H  . heparin subcutaneous  5,000 Units Subcutaneous Q8H  . imipenem-cilastatin  500 mg Intravenous Q6H  . insulin aspart  0-15 Units Subcutaneous Q4H  . ketorolac  15 mg Intravenous Q6H  . metoprolol  5 mg Intravenous Q6H   . sodium chloride  10-40 mL Intracatheter Q12H   Continuous: . sodium chloride 85 mL/hr at 05/16/13 0442  . Marland KitchenTPN (CLINIMIX-E) Adult 40 mL/hr at 05/15/13 1744   And  . fat emulsion 250 mL (05/15/13 1744)    Assessment/Plan: 1) Severe gallstone pancreatitis. 2) Ileus.   Clinically he continues to improve.  The patient reports passing flatus.  He is still using a significant amount of pain medications, which is fine.  I use this as a gauge of when to start PO intake.  He is on TPN at this time.  Plan: 1) Continue with supportive care - antibiotics, IV hydration, NG suctioning,  And TPN.  LOS: 7 days   Lukus Binion D 05/16/2013, 6:40 AM

## 2013-05-16 NOTE — Progress Notes (Signed)
[facility] Progress Note [place] TEAM 1 - Stepdown ICU Team   Nicolas Chandler ZOX:[id number] DOB: 20-Nov-1965 DOA: 05/09/2013 PCP: [name], MD  Brief narrative: 47 year old patient with known history of diabetes hypertension and dyslipidemia. Presented to the hospital with a three-week history of abdominal pain. This pain worsened 24 hours prior to presentation and was associated with nausea and vomiting. The patient characterizes the pain as being quite severe. States he never had this type of discomfort before. Nothing improves the pain but nothing made it worse. He denied issues with fevers or chills chest pain or shortness of breath prior to presentation. Evaluation in the ER revealed elevated lipase and leukocytosis. Additional diagnostic evaluation revealed choledocholithiasis. Gastroenterology was consulted.  HPI/Subjective: Patient endorsed pain continues to slowly decrease- continues with hiccups.  Assessment/Plan:  Sepsis/Pancreatitis due to choledocholithiasis  -Sepsis physiology has resolved -s/p ERCP w/stent placement -was receiving Unasyn - changed to Primaxin 12/21 by GI -Surgery following-  cholecystectomy tentatively planned for 12/24 -blood cx's NGTD  Ileus due to severe pancreatitis -repeat CT abd revealed ileus and increasing peripancreatic fluid (possible necrosis?) -cont NGT -surgery attempted clamping trial but pt did not tolerate -discussed need to limit narcotics with patient- was given a short trial of Toradol  -OOB - ambulate as much as possible-surgery concerned over possible early DVT so they have ordered LE venous duplex studies  Intractable hiccups -change IV Compazine from prn to scheduled -etiology from extensive diaphragmatic irritation from pancreatitis  Severe protein calorie malnutrition/hypokalemia -cont PICC - pharmacy managing TNA -IV replete K - suspect will also be replaced via TNA  Acute hypoxic respiratory  failure -had respiratory distress - 12/19 and 12/20 due to significantly distended abdomen- resolved after NG placed -still requiring oxygen and likely has a degree of mild third spacing in setting of inflammation/acute pancreatitis  AKI /hypernatremia/Dehydration - resolved with aggressive hydration - positive balance by 18.5 L- urine output still adequate  HTN  -cont B Blocker for now - control pain as much as possible  Sinus tachycardia -Likely physiologic secondary to sepsis/pain per above-IMPROVED -Cardiology following- Lopressor ordered IV -ECHO normal  Hypokalemia  -Monitor and replace as needed  DM  -Holding PO meds -cont SSI - sugars reasonable  HLD  -Holding PO meds  DVT prophylaxis: Subcutaneous heparin Code Status: Full Family Communication: Patient and wife at the bedside Disposition Plan/Expected LOS: SDU  Consultants: Gastroenterology Cardiology General Surgery  Procedures: ERCP with stent placement  12/17  Antibiotics: Unasyn 12/16 >>>12/20 Primaxin 12/21 >>>  Objective: Blood pressure 141/94, pulse 93, temperature 98.6 F (37 C), temperature source Oral, resp. rate 11, height 5\' 6"  (1.676 m), weight 166 lb 10.7 oz (75.6 kg), SpO2 99.00%.  Intake/Output Summary (Last 24 hours) at 05/16/13 1127 Last data filed at 05/16/13 8119  Gross per 24 hour  Intake   3435 ml  Output    700 ml  Net   2735 ml    Exam: General: AAO x 3, No acute respiratory distress-frequent hiccups Lungs: Clear to auscultation bilaterally without wheezes or crackles, 3L Cardiovascular: Regular rate and rhythm without murmur gallop or rub normal S1 and S2, no peripheral edema or JVD Abdomen: minimal distension, non tympanic to percussion since NGT placed, bowel sounds continue to be inaudible, no rebound, no obvious scites, no appreciable mass Musculoskeletal: No significant cyanosis, clubbing of bilateral lower extremities Neurological: Alert and oriented x 3, moves all  extremities x 4 without focal neurological deficits, CN 2-12 intact  Scheduled Meds:  Scheduled  Meds: . famotidine (PEPCID) IV  20 mg Intravenous Q12H  . heparin subcutaneous  5,000 Units Subcutaneous Q8H  . imipenem-cilastatin  500 mg Intravenous Q6H  . insulin aspart  0-15 Units Subcutaneous Q4H  . metoprolol  5 mg Intravenous Q6H  . potassium chloride  10 mEq Intravenous Q1 Hr x 4  . prochlorperazine  10 mg Intravenous Q6H  . sodium chloride  10-40 mL Intracatheter Q12H   Continuous Infusions: . sodium chloride 85 mL/hr at 05/16/13 0442  . sodium chloride    . Marland KitchenTPN (CLINIMIX-E) Adult 40 mL/hr at 05/15/13 1744   And  . fat emulsion 250 mL (05/15/13 1744)  . Marland KitchenTPN (CLINIMIX-E) Adult     And  . fat emulsion     Data Reviewed: Basic Metabolic Panel:  Recent Labs Lab 05/09/13 1232 05/09/13 1908  05/12/13 0505 05/13/13 0335 05/14/13 0350 05/15/13 0443 05/16/13 0455  NA 136  --   < > 147* 143 145 141 141  K 2.9*  --   < > 4.3 4.4 3.5 4.3 3.3*  CL 96  --   < > 117* 111 103 101 102  CO2 24  --   < > 19 26 31 30 27   GLUCOSE 201*  --   < > 102* 145* 128* 128* 193*  BUN 14  --   < > 31* 21 22 21 16   CREATININE 1.38*  --   < > 1.40* 1.18 1.10 1.06 0.89  CALCIUM 9.6  --   < > 7.9* 7.8* 8.1* 8.0* 7.6*  MG  --  1.8  --   --   --   --   --  2.1  PHOS  --   --   --   --   --   --   --  2.4  < > = values in this interval not displayed. Liver Function Tests:  Recent Labs Lab 05/12/13 0505 05/13/13 0335 05/14/13 0350 05/15/13 0443 05/16/13 0455  AST 77* 42* 30 23 23   ALT 141* 104* 79* 51 37  ALKPHOS 124* 115 172* 102 97  BILITOT 2.6* 1.8* 1.8* 1.7* 1.2  PROT 6.6 6.4 6.7 6.1 5.7*  ALBUMIN 2.7* 2.7* 2.8* 2.4* 2.2*    Recent Labs Lab 05/09/13 1232 05/11/13 0315 05/12/13 0505 05/13/13 0335 05/15/13 0443  LIPASE >3000* 1778* 732* 148* 53   No results found for this basename: AMMONIA,  in the last 168 hours CBC:  Recent Labs Lab 05/09/13 1232  05/12/13 0505  05/13/13 0335 05/14/13 0350 05/15/13 0443 05/16/13 0455  WBC 34.0*  < > 16.9* 13.8* 15.5* 13.8* 12.0*  NEUTROABS 24.6*  --   --   --   --   --  9.9*  HGB 17.5*  < > 13.0 12.0* 12.5* 11.6* 11.2*  HCT 49.4  < > 40.0 37.6* 39.5 36.0* 33.2*  MCV 91.8  < > 97.1 97.7 96.1 94.7 92.2  PLT 239  < > 112* 120* 141* 142* 162  < > = values in this interval not displayed.  CBG:  Recent Labs Lab 05/15/13 1741 05/15/13 1954 05/16/13 0046 05/16/13 0405 05/16/13 0759  GLUCAP 99 148* 188* 184* 202*    Recent Results (from the past 240 hour(s))  URINE CULTURE     Status: None   Collection Time    05/09/13  1:47 PM      Result Value Range Status   Specimen Description URINE, CLEAN CATCH   Final   Special Requests Normal  Final   Culture  Setup Time     Final   Value: 05/09/2013 22:24     Performed at [facility] Count     Final   Value: NO GROWTH     Performed at [facility]   Culture     Final   Value: NO GROWTH     Performed at [facility]   Report Status 05/10/2013 FINAL   Final  CULTURE, BLOOD (ROUTINE X 2)     Status: None   Collection Time    05/11/13  9:35 AM      Result Value Range Status   Specimen Description BLOOD RIGHT ANTECUBITAL   Final   Special Requests     Final   Value: BOTTLES DRAWN AEROBIC AND ANAEROBIC 10CC AER 5CC ANA   Culture  Setup Time     Final   Value: 05/11/2013 14:59     Performed at [facility]   Culture     Final   Value:        BLOOD CULTURE RECEIVED NO GROWTH TO DATE CULTURE WILL BE HELD FOR 5 DAYS BEFORE ISSUING A FINAL NEGATIVE REPORT     Performed at [facility]   Report Status PENDING   Incomplete  CULTURE, BLOOD (ROUTINE X 2)     Status: None   Collection Time    05/11/13  9:50 AM      Result Value Range Status   Specimen Description BLOOD WRIST RIGHT   Final   Special Requests BOTTLES DRAWN AEROBIC AND ANAEROBIC 10CC   Final   Culture  Setup Time     Final   Value: 05/11/2013 14:59      Performed at [facility]   Culture     Final   Value:        BLOOD CULTURE RECEIVED NO GROWTH TO DATE CULTURE WILL BE HELD FOR 5 DAYS BEFORE ISSUING A FINAL NEGATIVE REPORT     Performed at [facility]   Report Status PENDING   Incomplete     Studies:  Recent x-ray studies have been reviewed in detail by the Attending Physician  ELLIS,ALLISON L., ANP [phone] 05/16/2013, 11:27 AM   I have examined the patient, reviewed the chart and modified the above note which I agree with.   Saul Dorsi,MD [phone] 05/16/2013, 5:29 PM

## 2013-05-16 NOTE — Progress Notes (Signed)
[facility] Progress Note 6 Days Post-Op  Subjective: Patient is better, but still has some mid-abdominal pain and discomfort.  Objective: Vital signs in last 24 hours: Temp:  [97.9 F (36.6 C)-98.9 F (37.2 C)] 98.9 F (37.2 C) (12/23 0400) Pulse Rate:  [85-96] 96 (12/23 0400) Resp:  [12-20] 16 (12/23 0400) BP: (142-163)/(81-99) 153/96 mmHg (12/23 0400) SpO2:  [94 %-99 %] 94 % (12/23 0400) Last BM Date: 05/15/13  Intake/Output from previous day: 12/22 0701 - 12/23 0700 In: 3905 [I.V.:2825; IV Piggyback:450; TPN:630] Out: 700 [Emesis/NG output:700] Intake/Output this shift:    General: No acute distress  Lungs: Clear to auscultation  Abd: Still mildly distended, tinkling bowel sounds, not normal, moderate epigstric and periumbilical tenderness  Extremities: RLE bigger than left.  Warm.  Not tender, but edematous into the foot  Neuro: Completely intact  Lab Results:  @LABLAST2 (wbc:2,hgb:2,hct:2,plt:2) BMET  Recent Labs  05/15/13 0443 05/16/13 0455  NA 141 141  K 4.3 3.3*  CL 101 102  CO2 30 27  GLUCOSE 128* 193*  BUN 21 16  CREATININE 1.06 0.89  CALCIUM 8.0* 7.6*   PT/INR No results found for this basename: LABPROT, INR,  in the last 72 hours ABG No results found for this basename: PHART, PCO2, PO2, HCO3,  in the last 72 hours  Studies/Results: No results found.  Anti-infectives: Anti-infectives   Start     Dose/Rate Route Frequency Ordered Stop   05/16/13 0200  imipenem-cilastatin (PRIMAXIN) 500 mg in sodium chloride 0.9 % 100 mL IVPB     500 mg 200 mL/hr over 30 Minutes Intravenous Every 6 hours 05/15/13 2045     05/14/13 1330  imipenem-cilastatin (PRIMAXIN) 500 mg in sodium chloride 0.9 % 100 mL IVPB  Status:  Discontinued     500 mg 200 mL/hr over 30 Minutes Intravenous Every 6 hours 05/14/13 1247 05/15/13 2045   05/09/13 2200  Ampicillin-Sulbactam (UNASYN) 3 g in sodium chloride 0.9 % 100 mL IVPB  Status:  Discontinued     3 g 100 mL/hr over 60  Minutes Intravenous Every 6 hours 05/09/13 1733 05/13/13 1821   05/09/13 1630  Ampicillin-Sulbactam (UNASYN) 3 g in sodium chloride 0.9 % 100 mL IVPB     3 g 100 mL/hr over 60 Minutes Intravenous  Once 05/09/13 1605 05/09/13 1721      Assessment/Plan: s/p Procedure(s): ENDOSCOPIC RETROGRADE CHOLANGIOPANCREATOGRAPHY (ERCP) Keep NPO but plan for OR tomorrow. Get duplex venous study of RLE to rule out DVT Clinically pancreatitis is improving. No other changes for now.  LOS: 7 days   Marta Lamas. Gae Bon, MD, FACS [phone] [facility] 05/16/2013

## 2013-05-16 NOTE — Progress Notes (Signed)
PARENTERAL NUTRITION CONSULT NOTE - INITIAL  Pharmacy Consult:  TPN Indication:  Severe pancreatitis  No Known Allergies  Patient Measurements: Height: 5\' 6"  (167.6 cm) Weight: 166 lb 10.7 oz (75.6 kg) IBW/kg (Calculated) : 63.8  Vital Signs: Temp: 98.9 F (37.2 C) (12/23 0400) Temp src: Oral (12/23 0400) BP: 153/96 mmHg (12/23 0400) Pulse Rate: 96 (12/23 0400) Intake/Output from previous day: 12/22 0701 - 12/23 0700 In: 3905 [I.V.:2825; IV Piggyback:450; TPN:630] Out: 700 [Emesis/NG output:700]  Labs:  Recent Labs  05/14/13 0350 05/15/13 0443 05/16/13 0455  WBC 15.5* 13.8* 12.0*  HGB 12.5* 11.6* 11.2*  HCT 39.5 36.0* 33.2*  PLT 141* 142* 162     Recent Labs  05/14/13 0350 05/15/13 0443 05/16/13 0435 05/16/13 0455  NA 145 141  --  141  K 3.5 4.3  --  3.3*  CL 103 101  --  102  CO2 31 30  --  27  GLUCOSE 128* 128*  --  193*  BUN 22 21  --  16  CREATININE 1.10 1.06  --  0.89  CALCIUM 8.1* 8.0*  --  7.6*  MG  --   --   --  2.1  PHOS  --   --   --  2.4  PROT 6.7 6.1  --  5.7*  ALBUMIN 2.8* 2.4*  --  2.2*  AST 30 23  --  23  ALT 79* 51  --  37  ALKPHOS 172* 102  --  97  BILITOT 1.8* 1.7*  --  1.2  TRIG  --   --  157*  --    Estimated Creatinine Clearance: 94.6 ml/min (by C-G formula based on Cr of 0.89).    Recent Labs  05/16/13 0046 05/16/13 0405 05/16/13 0759  GLUCAP 188* 184* 202*    Medical History: Past Medical History  Diagnosis Date  . Diabetes mellitus without complication   . Hypertension   . Cholecystitis 04/2013  . Fatty liver   . Pancreatitis     Insulin Requirements in the past 12 hours:  8 units SSI  Assessment: 47 YOM admitted 05/09/13 with abdominal pain from pancreatitis due to choledocholithiasis and is now s/p ERCP and stenting on 05/10/13.  Postoperatively, he experienced abdominal distention and pain.  CT showed possible necrosis but surgery is delayed until his gallstone pancreatitis improves.  Pharmacy consulted  to manage TPN for severe/necrotizing pancreatitis and prolonged NPO status.  GI: Severe/necrotizing pancreatitis, NGT placed 12/20, NPO since 12/16 - lipase down to WNL. Passing flatus on 12/23 a.m but still with mid-abd pain and discomfort- remains NPO. Surgery planning to take to the OR on 12/24 to remove gallbladder. NGO/24h: 400 cc (reduced). On pepcid, compazine Endo: Hx DM, CBGs/12h: 148-202 Lytes: Na 141, K 3.3 << 4.3, Phos 2.4, Mg 2.1, CoCa~8.9 Renal: SCr down 0.89, CrCl ~90-100 ml/min, 1/2NS at 85 ml/hr Pulm: 99% on 3L [place] Cards: Hx HTN/DL - BP elevated, HR wnl. On IV lopressor.  Hepatobil: LFTs WNL,Tbili 1.2 << 1.7, alb 2.2, TG 157 (12/23) Neuro: Toradol + PRN Dilaudid - GCS 15, CPOT 4 ID: Primaxin D#3 (total abx D#8) for empiric pancreatitis coverage. Afebrile, WBC 12 << 13.8,  All cx NG or ngtd Best Practices: Heparin SQ TPN Access: R-PICC line placed 12/22 TPN day#: 1 (12/22 >> current)  Current Nutrition:  NPO Clinimix E 5/15 at 40 ml/hr + IVFE 20% at 5 ml/hr provides 922 kcal and 48 grams of protein * Goal rate of Clinimix E 5/15  at 105 ml/hr + IVFE 20% at 10 ml/hr will provide 2269 kcal and 126 grams of protein per day  Nutritional Goals:  2250-2450 kCal, 115-125 grams of protein daily per RD assessment on 12/22  Plan:   - Increase Clinimix E 5/15 to 83 ml/hr + IVFE 20% at 10 ml/hr - Add 30 units insulin R to TPN bag - Daily multivitamin and trace elements - KCl 10 mEq IV x 4 runs - Continue SSI as ordered - Decrease IVF to 40 ml/hr once TPN rate is increased at 1800 today - Standard TPN labs  Georgina Pillion, PharmD, BCPS Clinical Pharmacist Pager: [phone] 05/16/2013 9:21 AM

## 2013-05-16 NOTE — Progress Notes (Signed)
Bilateral lower extremity venous duplex:  No evidence of DVT, superficial thrombosis, or Baker's Cyst.   

## 2013-05-17 ENCOUNTER — Encounter (HOSPITAL_COMMUNITY): Payer: BC Managed Care – PPO | Admitting: Anesthesiology

## 2013-05-17 ENCOUNTER — Encounter (HOSPITAL_COMMUNITY): Payer: Self-pay | Admitting: Anesthesiology

## 2013-05-17 ENCOUNTER — Encounter (HOSPITAL_COMMUNITY): Admission: EM | Disposition: A | Discharge status: Home / Self Care | Payer: Self-pay | Source: Home / Self Care

## 2013-05-17 ENCOUNTER — Inpatient Hospital Stay (HOSPITAL_COMMUNITY): Payer: BC Managed Care – PPO | Admitting: Anesthesiology

## 2013-05-17 ENCOUNTER — Inpatient Hospital Stay (HOSPITAL_COMMUNITY): Payer: BC Managed Care – PPO

## 2013-05-17 DIAGNOSIS — K8010 Calculus of gallbladder with chronic cholecystitis without obstruction: Secondary | ICD-10-CM

## 2013-05-17 HISTORY — PX: LAPAROSCOPIC CHOLECYSTECTOMY: SUR755

## 2013-05-17 HISTORY — PX: CHOLECYSTECTOMY: SHX55

## 2013-05-17 LAB — CULTURE, BLOOD (ROUTINE X 2): Culture: NO GROWTH

## 2013-05-17 LAB — GLUCOSE, CAPILLARY
Glucose-Capillary: 194 mg/dL — ABNORMAL HIGH (ref 70–99)
Glucose-Capillary: 238 mg/dL — ABNORMAL HIGH (ref 70–99)
Glucose-Capillary: 264 mg/dL — ABNORMAL HIGH (ref 70–99)
Glucose-Capillary: 289 mg/dL — ABNORMAL HIGH (ref 70–99)
Glucose-Capillary: 325 mg/dL — ABNORMAL HIGH (ref 70–99)

## 2013-05-17 LAB — CBC
HCT: 33.8 % — ABNORMAL LOW (ref 39.0–52.0)
MCH: 30.2 pg (ref 26.0–34.0)
MCV: 92.1 fL (ref 78.0–100.0)
Platelets: 189 10*3/uL (ref 150–400)
RBC: 3.67 MIL/uL — ABNORMAL LOW (ref 4.22–5.81)
RDW: 13.1 % (ref 11.5–15.5)

## 2013-05-17 LAB — COMPREHENSIVE METABOLIC PANEL
ALT: 30 U/L (ref 0–53)
Albumin: 2.2 g/dL — ABNORMAL LOW (ref 3.5–5.2)
Alkaline Phosphatase: 97 U/L (ref 39–117)
BUN: 14 mg/dL (ref 6–23)
CO2: 26 mEq/L (ref 19–32)
Creatinine, Ser: 0.94 mg/dL (ref 0.50–1.35)
GFR calc Af Amer: >90 mL/min (ref >90)
Sodium: 135 mEq/L (ref 135–145)
Total Protein: 5.8 g/dL — ABNORMAL LOW (ref 6.0–8.3)

## 2013-05-17 SURGERY — LAPAROSCOPIC CHOLECYSTECTOMY WITH INTRAOPERATIVE CHOLANGIOGRAM
Anesthesia: General

## 2013-05-17 MED ORDER — BUPIVACAINE-EPINEPHRINE 0.25% -1:200000 IJ SOLN
INTRAMUSCULAR | Status: DC | PRN
  Administered 2013-05-17: 20 mL

## 2013-05-17 MED ORDER — ENOXAPARIN SODIUM 40 MG/0.4ML ~~LOC~~ SOLN
40.0000 mg | SUBCUTANEOUS | Status: DC
  Administered 2013-05-18 – 2013-05-25 (×8): 40 mg via SUBCUTANEOUS
  Filled 2013-05-17 (×11): qty 0.4

## 2013-05-17 MED ORDER — HYDROMORPHONE HCL PF 1 MG/ML IJ SOLN
0.2500 mg | INTRAMUSCULAR | Status: DC | PRN
  Administered 2013-05-17 (×2): 0.5 mg via INTRAVENOUS

## 2013-05-17 MED ORDER — ROCURONIUM BROMIDE 100 MG/10ML IV SOLN
INTRAVENOUS | Status: DC | PRN
  Administered 2013-05-17 (×2): 10 mg via INTRAVENOUS
  Administered 2013-05-17: 50 mg via INTRAVENOUS

## 2013-05-17 MED ORDER — PHENYLEPHRINE HCL 10 MG/ML IJ SOLN
INTRAMUSCULAR | Status: DC | PRN
  Administered 2013-05-17 (×2): 80 ug via INTRAVENOUS

## 2013-05-17 MED ORDER — BUPIVACAINE-EPINEPHRINE (PF) 0.25% -1:200000 IJ SOLN
INTRAMUSCULAR | Status: AC
  Filled 2013-05-17: qty 30

## 2013-05-17 MED ORDER — SODIUM CHLORIDE 0.9 % IR SOLN
Status: DC | PRN
  Administered 2013-05-17: 2000 mL

## 2013-05-17 MED ORDER — 0.9 % SODIUM CHLORIDE (POUR BTL) OPTIME
TOPICAL | Status: DC | PRN
  Administered 2013-05-17: 1000 mL

## 2013-05-17 MED ORDER — LACTATED RINGERS IV SOLN
INTRAVENOUS | Status: DC | PRN
  Administered 2013-05-17: 10:00:00 via INTRAVENOUS

## 2013-05-17 MED ORDER — NEOSTIGMINE METHYLSULFATE 1 MG/ML IJ SOLN
INTRAMUSCULAR | Status: DC | PRN
  Administered 2013-05-17: 4 mg via INTRAVENOUS

## 2013-05-17 MED ORDER — HYDROMORPHONE HCL PF 1 MG/ML IJ SOLN
INTRAMUSCULAR | Status: AC
  Administered 2013-05-17: 0.5 mg via INTRAVENOUS
  Filled 2013-05-17: qty 1

## 2013-05-17 MED ORDER — PROCHLORPERAZINE EDISYLATE 5 MG/ML IJ SOLN
10.0000 mg | Freq: Four times a day (QID) | INTRAMUSCULAR | Status: DC | PRN
  Administered 2013-05-17 – 2013-05-20 (×6): 10 mg via INTRAVENOUS
  Filled 2013-05-17 (×7): qty 2

## 2013-05-17 MED ORDER — SODIUM CHLORIDE 0.9 % IV SOLN
INTRAVENOUS | Status: DC | PRN
  Administered 2013-05-17: 12:00:00

## 2013-05-17 MED ORDER — TRACE MINERALS CR-CU-F-FE-I-MN-MO-SE-ZN IV SOLN
INTRAVENOUS | Status: AC
  Administered 2013-05-17: 17:00:00 via INTRAVENOUS
  Filled 2013-05-17: qty 2000

## 2013-05-17 MED ORDER — PROPOFOL 10 MG/ML IV BOLUS
INTRAVENOUS | Status: DC | PRN
  Administered 2013-05-17: 150 mg via INTRAVENOUS

## 2013-05-17 MED ORDER — ONDANSETRON HCL 4 MG/2ML IJ SOLN
INTRAMUSCULAR | Status: DC | PRN
  Administered 2013-05-17: 4 mg via INTRAVENOUS

## 2013-05-17 MED ORDER — LACTATED RINGERS IV SOLN
INTRAVENOUS | Status: DC
  Administered 2013-05-17: 10:00:00 via INTRAVENOUS

## 2013-05-17 MED ORDER — FENTANYL CITRATE 0.05 MG/ML IJ SOLN
INTRAMUSCULAR | Status: DC | PRN
  Administered 2013-05-17: 100 ug via INTRAVENOUS
  Administered 2013-05-17: 150 ug via INTRAVENOUS

## 2013-05-17 MED ORDER — LIDOCAINE HCL (CARDIAC) 20 MG/ML IV SOLN
INTRAVENOUS | Status: DC | PRN
  Administered 2013-05-17: 7 mg via INTRAVENOUS

## 2013-05-17 MED ORDER — GLYCOPYRROLATE 0.2 MG/ML IJ SOLN
INTRAMUSCULAR | Status: DC | PRN
  Administered 2013-05-17: .7 mg via INTRAVENOUS

## 2013-05-17 MED ORDER — FAT EMULSION 20 % IV EMUL
250.0000 mL | INTRAVENOUS | Status: AC
  Administered 2013-05-17: 250 mL via INTRAVENOUS
  Filled 2013-05-17: qty 250

## 2013-05-17 SURGICAL SUPPLY — 43 items
APPLIER CLIP 5 13 M/L LIGAMAX5 (MISCELLANEOUS) ×2
APPLIER CLIP ROT 10 11.4 M/L (STAPLE) ×2
BLADE SURG ROTATE 9660 (MISCELLANEOUS) IMPLANT
CANISTER SUCTION 2500CC (MISCELLANEOUS) ×2 IMPLANT
CHLORAPREP W/TINT 26ML (MISCELLANEOUS) ×2 IMPLANT
CLIP APPLIE 5 13 M/L LIGAMAX5 (MISCELLANEOUS) ×1 IMPLANT
CLIP APPLIE ROT 10 11.4 M/L (STAPLE) ×1 IMPLANT
COVER MAYO STAND STRL (DRAPES) ×2 IMPLANT
COVER SURGICAL LIGHT HANDLE (MISCELLANEOUS) ×2 IMPLANT
DECANTER SPIKE VIAL GLASS SM (MISCELLANEOUS) ×4 IMPLANT
DERMABOND ADVANCED (GAUZE/BANDAGES/DRESSINGS) ×1
DERMABOND ADVANCED .7 DNX12 (GAUZE/BANDAGES/DRESSINGS) ×1 IMPLANT
DRAPE C-ARM 42X72 X-RAY (DRAPES) ×2 IMPLANT
DRAPE UTILITY 15X26 W/TAPE STR (DRAPE) ×4 IMPLANT
DRSG TEGADERM 2-3/8X2-3/4 SM (GAUZE/BANDAGES/DRESSINGS) ×8 IMPLANT
ELECT REM PT RETURN 9FT ADLT (ELECTROSURGICAL) ×2
ELECTRODE REM PT RTRN 9FT ADLT (ELECTROSURGICAL) ×1 IMPLANT
GLOVE BIO SURGEON STRL SZ8 (GLOVE) ×2 IMPLANT
GLOVE BIOGEL PI IND STRL 8 (GLOVE) ×2 IMPLANT
GLOVE BIOGEL PI INDICATOR 8 (GLOVE) ×2
GLOVE ECLIPSE 7.5 STRL STRAW (GLOVE) ×2 IMPLANT
GOWN STRL NON-REIN LRG LVL3 (GOWN DISPOSABLE) ×8 IMPLANT
GOWN STRL REIN XL XLG (GOWN DISPOSABLE) ×2 IMPLANT
KIT BASIN OR (CUSTOM PROCEDURE TRAY) ×2 IMPLANT
KIT ROOM TURNOVER OR (KITS) ×2 IMPLANT
NS IRRIG 1000ML POUR BTL (IV SOLUTION) ×2 IMPLANT
PAD ARMBOARD 7.5X6 YLW CONV (MISCELLANEOUS) ×4 IMPLANT
POUCH RETRIEVAL ECOSAC 10 (ENDOMECHANICALS) ×1 IMPLANT
POUCH RETRIEVAL ECOSAC 10MM (ENDOMECHANICALS) ×1
SCISSORS LAP 5X35 DISP (ENDOMECHANICALS) ×2 IMPLANT
SET CHOLANGIOGRAPH 5 50 .035 (SET/KITS/TRAYS/PACK) ×2 IMPLANT
SET IRRIG TUBING LAPAROSCOPIC (IRRIGATION / IRRIGATOR) ×2 IMPLANT
SLEEVE ENDOPATH XCEL 5M (ENDOMECHANICALS) ×4 IMPLANT
SPECIMEN JAR SMALL (MISCELLANEOUS) ×2 IMPLANT
SUT MNCRL AB 4-0 PS2 18 (SUTURE) ×2 IMPLANT
TOWEL OR 17X24 6PK STRL BLUE (TOWEL DISPOSABLE) ×2 IMPLANT
TOWEL OR 17X26 10 PK STRL BLUE (TOWEL DISPOSABLE) ×2 IMPLANT
TRAY LAPAROSCOPIC (CUSTOM PROCEDURE TRAY) ×2 IMPLANT
TROCAR BLADELESS 11MM (ENDOMECHANICALS) ×2 IMPLANT
TROCAR XCEL BLUNT TIP 100MML (ENDOMECHANICALS) ×2 IMPLANT
TROCAR XCEL NON-BLD 11X100MML (ENDOMECHANICALS) IMPLANT
TROCAR XCEL NON-BLD 5MMX100MML (ENDOMECHANICALS) ×2 IMPLANT
WATER STERILE IRR 1000ML POUR (IV SOLUTION) IMPLANT

## 2013-05-17 NOTE — Progress Notes (Signed)
Patient pulled his NGT out today.  Not much drainage.  May be able to leave out postoperatively.  Ready for OR today.  Marta Lamas. Gae Bon, MD, FACS [phone] [facility]

## 2013-05-17 NOTE — Op Note (Addendum)
OPERATIVE REPORT  DATE OF OPERATION: 05/17/2013  PATIENT:  Nicolas Chandler  47 y.o. male  PRE-OPERATIVE DIAGNOSIS:  Biliary pancreatitis  POST-OPERATIVE DIAGNOSIS:  Biliary pancreatitis  PROCEDURE:  Procedure(s): LAPAROSCOPIC CHOLECYSTECTOMY WITH INTRAOPERATIVE CHOLANGIOGRAM  SURGEON:  Surgeon(s): Cherylynn Ridges, MD Liz Malady, MD  ASSISTANT: Janee Morn  ANESTHESIA:   general  EBL: <50 ml  BLOOD ADMINISTERED: none  DRAINS: none   SPECIMEN:  Source of Specimen:  Gallbladder and stones  COUNTS CORRECT:  YES  PROCEDURE DETAILS: The patient was taken to the operating room and placed on the table in the supine position.  After an adequate endotracheal anesthetic was administered, he was prepped with ChloroPrep, and then draped in the usual manner exposing the entire abdomen laterally, inferiorly and up  to the costal margins.  After a proper timeout was performed including identifying the patient and the procedure to be performed, a supraumbilical 1.5cm midline incision was made using a #15 blade.  This was taken down to the fascia which was then incised with a #15 blade.  The edges of the fascia were tented up with Kocher clamps as the preperitoneal space was penetrated with a Kelly clamp into the peritoneum.  Once this was done, a pursestring suture of 0 Vicryl was passed around the fascial opening.  This was subsequently used to secure the [facility] cannula which was passed into the peritoneal cavity.  Once the [facility] cannula was in place, carbon dioxide gas was insufflated into the peritoneal cavity up to a maximal intra-abdominal pressure of 15mm Hg.The laparoscope, with attached camera and light source, was passed into the peritoneal cavity to visualize the direct insertion of two right upper quadrant 5mm cannulas, and a sup-xiphoid 11mm cannula.  Once all cannulas were in place, the dissection was begun.  Because of his overall increased in body fluid, his abdominal was was fairly  non-compliant and did not allow a lot of space for the procedure.  A 30 degree laparoscope was used, and an extra cannula had to be inserted for the placement of a retractor to complete the procedure.  Two ratcheted graspers were attached to the dome and infundibulum of the gallbladder and retracted towards the anterior abdominal wall and the right upper quadrant.  Using cautery attached to a dissecting forceps, the peritoneum overlaying the triangle of Chalot and the hepatoduodenal triangle was dissected away exposing the cystic duct and the cystic artery.  The cystic artery was clipped proximally and distally then transected prior to performing the cholangiogram  A clip was placed on the gallbladder side of the cystic duct, then a cholecytodochotomy made using the laparoscopic scissors.  Through the cholecystodochotomy a Cook catheter was passed to performed a cholangiogram.  The cholangiogram showed excellent flow into the duodenum, stent in place, no intraductal filling defects, and good proximal filling..  Once the cholangiogram was completed, the [facility] catheter was removed, and the distal cystic duct was clipped multiple times then transected.  The gallbladder was then dissected out of the hepatic bed without event.  It was retrieved from the abdomen (using an EndoCatch bag) without event.  Once the gallbladder was removed, the bed was inspected for hemostasis.  Once excellent hemostasis was obtained all gas and fluids were aspirated from above the liver, then the cannulas were removed.  The supraumbilical incision was closed using the pursestring suture which was in place.  0.25% bupivicaine with epinephrine was injected at all sites.  All 10mm or greater cannula sites were close using  a running subcuticular stitch of 4-0 Monocryl.  5.28mm cannula sites were closed with Dermabond only.Steri-Strips and Tagaderm were used to complete the dressings at all sites.  At this point all needle, sponge, and  instrument counts were correct.The patient was awakened from anesthesia and taken to the PACU in stable condition.  PATIENT DISPOSITION:  PACU - hemodynamically stable.   Floye Fesler O 12/24/201412:51 PM

## 2013-05-17 NOTE — Progress Notes (Signed)
[facility] Progress Note [place] TEAM 1 - Stepdown ICU Team   Braedan Meuth ZOX:[id number] DOB: Feb 07, 1966 DOA: 05/09/2013 PCP: [name], MD  Brief narrative: 47 year old patient with known history of diabetes hypertension and dyslipidemia. Presented to the hospital with a three-week history of abdominal pain. This pain worsened 24 hours prior to presentation and was associated with nausea and vomiting. He denied issues with fevers or chills chest pain or shortness of breath prior to presentation. Evaluation in the ER revealed elevated lipase and leukocytosis. Additional diagnostic evaluation revealed choledocholithiasis.   HPI/Subjective: Pt is seen in his room post-op.  He is resting comfortably, and denies cp, n/v, sob, or even current abdom pain.  Assessment/Plan:  Severe acute gallstone pancreatitis with sepsis due to choledocholithiasis  -Sepsis physiology has resolved -s/p ERCP w/stent placement -was receiving Unasyn - changed to Primaxin 12/21 by GI -now s/p lap cholecystectomy 12/24 -blood cx's NGTD  Ileus due to severe pancreatitis -CT abd revealed ileus and increasing peripancreatic fluid (possible necrosis?) -follow clinically post-op   Intractable hiccups -well controlled at present   Severe protein calorie malnutrition/hypokalemia -Pharmacy managing TNA via PICC  Acute hypoxic respiratory failure -had respiratory distress 12/19 and 12/20 due to significantly distended abdomen- resolved after NG placed  AKI / hypernatremia / Dehydration - resolved with aggressive hydration  HTN  -cont B Blocker for now - control pain as much as possible  Sinus tachycardia -Likely physiologic secondary to sepsis/pain per above -Lopressor ordered IV -ECHO normal  Hypokalemia  Monitor and replace as needed  DM  CBG currently poorly controlled - adjustments to be made in TNA - follow  HLD  Holding PO meds  DVT prophylaxis: lovenox  Code Status:  Full Family Communication: Patient and wife at the bedside Disposition Plan/Expected LOS: SDU  Consultants: Gastroenterology Cardiology General Surgery  Procedures: 12/17 ERCP with stent placement  12/23 B LE venous dopplers - no DVT 12/24 lap choly  Antibiotics: Unasyn 12/16 >>>12/20 Primaxin 12/21 >>>  Objective: Blood pressure 124/71, pulse 110, temperature 99 F (37.2 C), temperature source Oral, resp. rate 17, height 5\' 6"  (1.676 m), weight 75.6 kg (166 lb 10.7 oz), SpO2 93.00%.  Intake/Output Summary (Last 24 hours) at 05/17/13 1834 Last data filed at 05/17/13 1800  Gross per 24 hour  Intake   2201 ml  Output    120 ml  Net   2081 ml   Exam: General: AAO x 3, No acute respiratory distress Lungs: Clear to auscultation bilaterally without wheezes or crackles Cardiovascular: Regular rate and rhythm without murmur gallop or rub normal S1 and S2, no peripheral edema or JVD Abdomen: minimal distension, bowel sounds continue to be inaudible, no rebound, no obvious ascites, no appreciable mass Musculoskeletal: No significant cyanosis, clubbing of bilateral lower extremities Neurological: Alert and oriented x 3, moves all extremities x 4 without focal neurological deficits, CN 2-12 intact  Scheduled Meds:  Scheduled Meds: . [START ON 05/18/2013] enoxaparin (LOVENOX) injection  40 mg Subcutaneous Q24H  . famotidine (PEPCID) IV  20 mg Intravenous Q12H  . imipenem-cilastatin  500 mg Intravenous Q6H  . insulin aspart  0-15 Units Subcutaneous Q4H  . metoprolol  5 mg Intravenous Q6H  . prochlorperazine  10 mg Intravenous Q6H  . sodium chloride  10-40 mL Intracatheter Q12H   Continuous Infusions: . sodium chloride 40 mL/hr at 05/17/13 1800  . Marland KitchenTPN (CLINIMIX-E) Adult 83 mL/hr at 05/17/13 1725   And  . fat emulsion 250 mL (05/17/13 1725)  . lactated  ringers 50 mL/hr at 05/17/13 1026   Data Reviewed: Basic Metabolic Panel:  Recent Labs Lab 05/13/13 0335 05/14/13 0350  05/15/13 0443 05/16/13 0455 05/17/13 0540  NA 143 145 141 141 135  K 4.4 3.5 4.3 3.3* 3.5  CL 111 103 101 102 100  CO2 26 31 30 27 26   GLUCOSE 145* 128* 128* 193* 236*  BUN 21 22 21 16 14   CREATININE 1.18 1.10 1.06 0.89 0.94  CALCIUM 7.8* 8.1* 8.0* 7.6* 7.7*  MG  --   --   --  2.1  --   PHOS  --   --   --  2.4  --    Liver Function Tests:  Recent Labs Lab 05/13/13 0335 05/14/13 0350 05/15/13 0443 05/16/13 0455 05/17/13 0540  AST 42* 30 23 23 29   ALT 104* 79* 51 37 30  ALKPHOS 115 172* 102 97 97  BILITOT 1.8* 1.8* 1.7* 1.2 1.2  PROT 6.4 6.7 6.1 5.7* 5.8*  ALBUMIN 2.7* 2.8* 2.4* 2.2* 2.2*    Recent Labs Lab 05/11/13 0315 05/12/13 0505 05/13/13 0335 05/15/13 0443  LIPASE 1778* 732* 148* 53   CBC:  Recent Labs Lab 05/13/13 0335 05/14/13 0350 05/15/13 0443 05/16/13 0455 05/17/13 0540  WBC 13.8* 15.5* 13.8* 12.0* 13.9*  NEUTROABS  --   --   --  9.9*  --   HGB 12.0* 12.5* 11.6* 11.2* 11.1*  HCT 37.6* 39.5 36.0* 33.2* 33.8*  MCV 97.7 96.1 94.7 92.2 92.1  PLT 120* 141* 142* 162 189    CBG:  Recent Labs Lab 05/17/13 0425 05/17/13 0834 05/17/13 1019 05/17/13 1240 05/17/13 1626  GLUCAP 194* 238* 264* 289* 325*    Recent Results (from the past 240 hour(s))  URINE CULTURE     Status: None   Collection Time    05/09/13  1:47 PM      Result Value Range Status   Specimen Description URINE, CLEAN CATCH   Final   Special Requests Normal   Final   Culture  Setup Time     Final   Value: 05/09/2013 22:24     Performed at [facility] Count     Final   Value: NO GROWTH     Performed at [facility]   Culture     Final   Value: NO GROWTH     Performed at [facility]   Report Status 05/10/2013 FINAL   Final  CULTURE, BLOOD (ROUTINE X 2)     Status: None   Collection Time    05/11/13  9:35 AM      Result Value Range Status   Specimen Description BLOOD RIGHT ANTECUBITAL   Final   Special Requests     Final   Value:  BOTTLES DRAWN AEROBIC AND ANAEROBIC 10CC AER 5CC ANA   Culture  Setup Time     Final   Value: 05/11/2013 14:59     Performed at [facility]   Culture     Final   Value: NO GROWTH 5 DAYS     Performed at [facility]   Report Status 05/17/2013 FINAL   Final  CULTURE, BLOOD (ROUTINE X 2)     Status: None   Collection Time    05/11/13  9:50 AM      Result Value Range Status   Specimen Description BLOOD WRIST RIGHT   Final   Special Requests BOTTLES DRAWN AEROBIC AND ANAEROBIC 10CC   Final  Culture  Setup Time     Final   Value: 05/11/2013 14:59     Performed at [facility]   Culture     Final   Value: NO GROWTH 5 DAYS     Performed at [facility]   Report Status 05/17/2013 FINAL   Final     Studies:  Recent x-ray studies have been reviewed in detail by the Attending Physician  Lonia Blood, MD [facility] Office  [phone] Pager [phone]  On-Call/Text Page:      [email or web address]      password [facility]  05/17/2013, 6:34 PM

## 2013-05-17 NOTE — Progress Notes (Signed)
Entered room to assist patient to bathroom, NG tube on floor. Patient pulled NG tube out of nostril while sitting up on side of bed per family member. [facility] floor coverage paged.

## 2013-05-17 NOTE — Anesthesia Postprocedure Evaluation (Signed)
  Anesthesia Post-op Note  Patient: Nicolas Chandler  Procedure(s) Performed: Procedure(s): LAPAROSCOPIC CHOLECYSTECTOMY WITH INTRAOPERATIVE CHOLANGIOGRAM (N/A)  Patient Location: PACU  Anesthesia Type:General  Level of Consciousness: awake  Airway and Oxygen Therapy: Patient Spontanous Breathing  Post-op Pain: mild  Post-op Assessment: Post-op Vital signs reviewed  Post-op Vital Signs: Reviewed  Complications: No apparent anesthesia complications

## 2013-05-17 NOTE — Transfer of Care (Signed)
Immediate Anesthesia Transfer of Care Note  Patient: Nicolas Chandler  Procedure(s) Performed: Procedure(s): LAPAROSCOPIC CHOLECYSTECTOMY WITH INTRAOPERATIVE CHOLANGIOGRAM (N/A)  Patient Location: PACU  Anesthesia Type:General  Level of Consciousness: awake and alert   Airway & Oxygen Therapy: Patient Spontanous Breathing and Patient connected to nasal cannula oxygen  Post-op Assessment: Report given to PACU RN and Post -op Vital signs reviewed and stable  Post vital signs: Reviewed and stable  Complications: No apparent anesthesia complications

## 2013-05-17 NOTE — Progress Notes (Signed)
Subjective: He feels better.  He pulled out his NG tube.  Pain medication usage every 2 hours.  Objective: Vital signs in last 24 hours: Temp:  [98 F (36.7 C)-98.8 F (37.1 C)] 98 F (36.7 C) (12/24 0433) Pulse Rate:  [99-108] 104 (12/24 0436) Resp:  [16-24] 24 (12/24 0436) BP: (145-175)/(92-104) 146/99 mmHg (12/24 0436) SpO2:  [93 %-98 %] 98 % (12/24 0436) Weight:  [166 lb 10.7 oz (75.6 kg)] 166 lb 10.7 oz (75.6 kg) (12/24 0433) Last BM Date: 05/15/13  Intake/Output from previous day: 12/23 0701 - 12/24 0700 In: 2979 [I.V.:1425; IV Piggyback:300; TPN:1254] Out: 220 [Emesis/NG output:220] Intake/Output this shift:    General appearance: alert and no distress GI: soft, non-tender; bowel sounds normal; no masses,  no organomegaly  Lab Results:  Recent Labs  05/15/13 0443 05/16/13 0455 05/17/13 0540  WBC 13.8* 12.0* 13.9*  HGB 11.6* 11.2* 11.1*  HCT 36.0* 33.2* 33.8*  PLT 142* 162 189   BMET  Recent Labs  05/15/13 0443 05/16/13 0455 05/17/13 0540  NA 141 141 135  K 4.3 3.3* 3.5  CL 101 102 100  CO2 30 27 26   GLUCOSE 128* 193* 236*  BUN 21 16 14   CREATININE 1.06 0.89 0.94  CALCIUM 8.0* 7.6* 7.7*   LFT  Recent Labs  05/17/13 0540  PROT 5.8*  ALBUMIN 2.2*  AST 29  ALT 30  ALKPHOS 97  BILITOT 1.2   PT/INR No results found for this basename: LABPROT, INR,  in the last 72 hours Hepatitis Panel No results found for this basename: HEPBSAG, HCVAB, HEPAIGM, HEPBIGM,  in the last 72 hours C-Diff No results found for this basename: CDIFFTOX,  in the last 72 hours Fecal Lactopherrin No results found for this basename: FECLLACTOFRN,  in the last 72 hours  Studies/Results: No results found.  Medications:  Scheduled: . famotidine (PEPCID) IV  20 mg Intravenous Q12H  . heparin subcutaneous  5,000 Units Subcutaneous Q8H  . imipenem-cilastatin  500 mg Intravenous Q6H  . insulin aspart  0-15 Units Subcutaneous Q4H  . metoprolol  5 mg Intravenous Q6H  .  prochlorperazine  10 mg Intravenous Q6H  . sodium chloride  10-40 mL Intracatheter Q12H   Continuous: . sodium chloride 40 mL/hr at 05/17/13 0600  . Marland KitchenTPN (CLINIMIX-E) Adult 83 mL/hr at 05/17/13 0600   And  . fat emulsion 250 mL (05/17/13 0600)    Assessment/Plan: 1) Acute severe gallstone pancreatitis. 2) Cholelithiasis.   The patient was evaluated by Dr. Lindie Spruce.  The plan is for cholecystectomy today.    Plan: 1) Lap chole per Dr. Lindie Spruce.   LOS: 8 days   Shirle Provencal D 05/17/2013, 7:58 AM

## 2013-05-17 NOTE — Anesthesia Preprocedure Evaluation (Signed)
Anesthesia Evaluation  Patient identified by MRN, date of birth, ID band Patient awake    Reviewed: Allergy & Precautions, H&P , NPO status , Patient's Chart, lab work & pertinent test results  Airway Mallampati: II      Dental   Pulmonary neg pulmonary ROS,          Cardiovascular hypertension,     Neuro/Psych    GI/Hepatic GI history noted.   Endo/Other  diabetes  Renal/GU Renal disease     Musculoskeletal   Abdominal   Peds  Hematology   Anesthesia Other Findings   Reproductive/Obstetrics                           Anesthesia Physical Anesthesia Plan  ASA: III  Anesthesia Plan: General   Post-op Pain Management:    Induction: Intravenous  Airway Management Planned: Oral ETT  Additional Equipment:   Intra-op Plan:   Post-operative Plan: Extubation in OR  Informed Consent: I have reviewed the patients History and Physical, chart, labs and discussed the procedure including the risks, benefits and alternatives for the proposed anesthesia with the patient or authorized representative who has indicated his/her understanding and acceptance.   Dental advisory given  Plan Discussed with: CRNA, Anesthesiologist and Surgeon  Anesthesia Plan Comments:         Anesthesia Quick Evaluation

## 2013-05-17 NOTE — Progress Notes (Signed)
Post op VS on return to [facility]

## 2013-05-17 NOTE — Preoperative (Signed)
Beta Blockers   Reason not to administer Beta Blockers:Not Applicable 

## 2013-05-17 NOTE — Anesthesia Procedure Notes (Signed)
Procedure Name: Intubation Date/Time: 05/17/2013 10:48 AM Performed by: Gwenyth Allegra Pre-anesthesia Checklist: Patient identified, Emergency Drugs available, Suction available, Patient being monitored and Timeout performed Patient Re-evaluated:Patient Re-evaluated prior to inductionOxygen Delivery Method: Circle system utilized Preoxygenation: Pre-oxygenation with 100% oxygen Intubation Type: IV induction Ventilation: Mask ventilation without difficulty Laryngoscope Size: Mac and 4 Grade View: Grade I Tube type: Oral Tube size: 7.5 mm Number of attempts: 1 Airway Equipment and Method: Stylet Placement Confirmation: ETT inserted through vocal cords under direct vision,  breath sounds checked- equal and bilateral and positive ETCO2 Secured at: 21 cm Tube secured with: Tape Dental Injury: Teeth and Oropharynx as per pre-operative assessment

## 2013-05-17 NOTE — Progress Notes (Signed)
PARENTERAL NUTRITION CONSULT NOTE - INITIAL  Pharmacy Consult:  TPN Indication:  Severe pancreatitis  No Known Allergies  Patient Measurements: Height: 5\' 6"  (167.6 cm) Weight: 166 lb 10.7 oz (75.6 kg) IBW/kg (Calculated) : 63.8  Vital Signs: Temp: 98.1 F (36.7 C) (12/24 0800) Temp src: Oral (12/24 0800) BP: 149/90 mmHg (12/24 0800) Pulse Rate: 98 (12/24 0800) Intake/Output from previous day: 12/23 0701 - 12/24 0700 In: 2979 [I.V.:1425; IV Piggyback:300; TPN:1254] Out: 220 [Emesis/NG output:220]  Labs:  Recent Labs  05/15/13 0443 05/16/13 0455 05/17/13 0540  WBC 13.8* 12.0* 13.9*  HGB 11.6* 11.2* 11.1*  HCT 36.0* 33.2* 33.8*  PLT 142* 162 189     Recent Labs  05/15/13 0443 05/16/13 0435 05/16/13 0455 05/17/13 0540  NA 141  --  141 135  K 4.3  --  3.3* 3.5  CL 101  --  102 100  CO2 30  --  27 26  GLUCOSE 128*  --  193* 236*  BUN 21  --  16 14  CREATININE 1.06  --  0.89 0.94  CALCIUM 8.0*  --  7.6* 7.7*  MG  --   --  2.1  --   PHOS  --   --  2.4  --   PROT 6.1  --  5.7* 5.8*  ALBUMIN 2.4*  --  2.2* 2.2*  AST 23  --  23 29  ALT 51  --  37 30  ALKPHOS 102  --  97 97  BILITOT 1.7*  --  1.2 1.2  PREALBUMIN  --   --  5.4*  --   TRIG  --  157*  --   --    Estimated Creatinine Clearance: 89.6 ml/min (by C-G formula based on Cr of 0.94).    Recent Labs  05/17/13 0425 05/17/13 0834 05/17/13 1019  GLUCAP 194* 238* 264*    Medical History: Past Medical History  Diagnosis Date  . Diabetes mellitus without complication   . Hypertension   . Cholecystitis 04/2013  . Fatty liver   . Pancreatitis     Insulin Requirements in the past 12 hours:  24 units SSI/24h  [name] admitted 05/09/13 with abdominal pain from pancreatitis due to choledocholithiasis and is now s/p ERCP and stenting on 05/10/13.  Postop, he experienced abdominal distention and pain. CT showed possible necrosis but surgery is delayed until his gallstone pancreatitis  improves.  Pharmacy consulted to manage TPN for severe/necrotizing pancreatitis and prolonged NPO status.  GI: Severe/necrotizing pancreatitis, NGT placed 12/20, NPO since 12/16 - lipase down to WNL. Passing flatus on 12/23 a.m but still with mid-abd pain and discomfort- remains NPO. OR on 12/24 to remove gallbladder. NGO/24h: 220 cc (reduced)--pulled out NG tube On pepcid, compazine  Endo: Hx DM, CBGs/24h: 182-238 (104 at midnight) on SSI  Lytes: WNL  Renal: SCr down 0.94, CrCl ~89 ml/min  Pulm: 94% on 3L [place]  Cards: Hx HTN/DL - BP 409/81, HR 98 On IV lopressor.   Hepatobil: LFTs WNL,Tbili 1.2 << 1.7, alb 2.2, TG 157 (12/23)  Neuro: Toradol + PRN Dilaudid   ID: Primaxin D#4 (total abx D#9) for empiric pancreatitis coverage. Afebrile, WBC 13.9 up,  All cx NG or ngtd  Best Practices: Heparin SQ  TPN Access: R-PICC line placed 12/22  TPN day#: 2 (12/22 >> current)  Current Nutrition:  NPO Clinimix E 5/15 at 83 ml/hr + IVFE 20% at 10 ml/hr provides 1894 kcal and 99.6 grams of protein * Goal  rate of Clinimix E 5/15 at 100 ml/hr + IVFE 20% at 10 ml/hr will provide 2184 kcal and 120 grams of protein per day  Nutritional Goals:  2250-2450 kCal, 115-125 grams of protein daily per RD assessment on 12/22  Plan:   - Cholecystectomy today - Clinimix E 5/15 to 83 ml/hr + IVFE 20% at 10 ml/hr. Will not increase today due to lack of glucose control - Add 50 units insulin R to TPN bag - Daily multivitamin and trace elements - Continue SSI as ordered - Standard TPN labs due tomorrow.  Micayla Brathwaite S. Merilynn Finland, PharmD, [facility] Clinical Staff Pharmacist Pager [phone]  05/17/2013 11:05 AM

## 2013-05-18 LAB — COMPREHENSIVE METABOLIC PANEL
ALT: 63 U/L — ABNORMAL HIGH (ref 0–53)
AST: 93 U/L — ABNORMAL HIGH (ref 0–37)
Alkaline Phosphatase: 89 U/L (ref 39–117)
CO2: 23 mEq/L (ref 19–32)
Chloride: 99 mEq/L (ref 96–112)
Creatinine, Ser: 1.07 mg/dL (ref 0.50–1.35)
GFR calc Af Amer: >90 mL/min (ref >90)
GFR calc non Af Amer: 82 mL/min — ABNORMAL LOW (ref >90)
Glucose, Bld: 274 mg/dL — ABNORMAL HIGH (ref 70–99)
Potassium: 3.8 mEq/L (ref 3.5–5.1)
Total Bilirubin: 0.9 mg/dL (ref 0.3–1.2)

## 2013-05-18 LAB — GLUCOSE, CAPILLARY
Glucose-Capillary: 201 mg/dL — ABNORMAL HIGH (ref 70–99)
Glucose-Capillary: 218 mg/dL — ABNORMAL HIGH (ref 70–99)
Glucose-Capillary: 279 mg/dL — ABNORMAL HIGH (ref 70–99)

## 2013-05-18 LAB — CBC
HCT: 33.3 % — ABNORMAL LOW (ref 39.0–52.0)
MCV: 92.5 fL (ref 78.0–100.0)
Platelets: 197 10*3/uL (ref 150–400)
RBC: 3.6 MIL/uL — ABNORMAL LOW (ref 4.22–5.81)
WBC: 18.8 10*3/uL — ABNORMAL HIGH (ref 4.0–10.5)

## 2013-05-18 MED ORDER — POTASSIUM CHLORIDE 10 MEQ/50ML IV SOLN
10.0000 meq | INTRAVENOUS | Status: AC
  Administered 2013-05-18 (×2): 10 meq via INTRAVENOUS

## 2013-05-18 MED ORDER — TRACE MINERALS CR-CU-F-FE-I-MN-MO-SE-ZN IV SOLN
INTRAVENOUS | Status: AC
  Administered 2013-05-18 – 2013-05-19 (×2): via INTRAVENOUS
  Filled 2013-05-18: qty 2000

## 2013-05-18 MED ORDER — POTASSIUM CHLORIDE 10 MEQ/50ML IV SOLN
INTRAVENOUS | Status: AC
  Administered 2013-05-18: 10 meq via INTRAVENOUS
  Filled 2013-05-18: qty 50

## 2013-05-18 MED ORDER — FAT EMULSION 20 % IV EMUL
250.0000 mL | INTRAVENOUS | Status: AC
  Administered 2013-05-18: 250 mL via INTRAVENOUS
  Filled 2013-05-18: qty 250

## 2013-05-18 MED ORDER — SODIUM CHLORIDE 0.9 % IV SOLN
INTRAVENOUS | Status: DC
  Administered 2013-05-19: 20 mL/h via INTRAVENOUS
  Administered 2013-05-22 – 2013-05-24 (×2): via INTRAVENOUS

## 2013-05-18 NOTE — Progress Notes (Signed)
[facility] Progress Note [place] TEAM 1 - Stepdown ICU Team   Nicolas Chandler YNW:[id number] DOB: 1965/11/08 DOA: 05/09/2013 PCP: [name], MD  Brief narrative: 47 year old patient with known history of diabetes hypertension and dyslipidemia. Presented to the hospital with a three-week history of abdominal pain. This pain worsened 24 hours prior to presentation and was associated with nausea and vomiting. He denied issues with fevers or chills chest pain or shortness of breath prior to presentation. Evaluation in the ER revealed elevated lipase and leukocytosis. Additional diagnostic evaluation revealed choledocholithiasis.   HPI/Subjective: Pt is resting comfortably, and denies cp, n/v, sob, or even current abdom pain.  Assessment/Plan:  Severe acute gallstone pancreatitis with sepsis due to choledocholithiasis  -Sepsis physiology has resolved -s/p ERCP w/stent placement -was receiving Unasyn - changed to Primaxin 12/21 by GI -now s/p lap cholecystectomy 12/24 -blood cx's no growth   Ileus due to severe pancreatitis -CT abd revealed ileus and increasing peripancreatic fluid (possible necrosis?) -follow clinically post-op - has begun to pass gas this afternoon   Intractable hiccups -resolved   Severe protein calorie malnutrition/hypokalemia -Pharmacy managing TNA via PICC  Acute hypoxic respiratory failure -had respiratory distress 12/19 and 12/20 due to significantly distended abdomen- resolved after NG placed  AKI / hypernatremia / Dehydration - resolved with aggressive hydration  HTN  -cont B Blocker for now - control pain as much as possible  Sinus tachycardia -Likely physiologic secondary to sepsis/pain per above -Lopressor ordered IV -ECHO normal  Hypokalemia  Being monitored and replace per Pharmacy via TNA protocol   DM  CBG still poorly controlled - adjustments to be made in TNA again today per Pharmacy   HLD  Holding PO meds  DVT  prophylaxis: lovenox  Code Status: Full Family Communication: Patient and wife at the bedside Disposition Plan/Expected LOS: SDU - if stable over night will plan for transfer to surgical floor 12/26  Consultants: Gastroenterology Cardiology General Surgery  Procedures: 12/17 ERCP with stent placement  12/23 B LE venous dopplers - no DVT 12/24 lap choly  Antibiotics: Unasyn 12/16 >>>12/20 Primaxin 12/21 >>>  Objective: Blood pressure 127/77, pulse 110, temperature 98 F (36.7 C), temperature source Oral, resp. rate 22, height 5\' 6"  (1.676 m), weight 75.6 kg (166 lb 10.7 oz), SpO2 97.00%.  Intake/Output Summary (Last 24 hours) at 05/18/13 1236 Last data filed at 05/18/13 1100  Gross per 24 hour  Intake   3259 ml  Output      0 ml  Net   3259 ml   Exam: General: No acute respiratory distress Lungs: Clear to auscultation bilaterally without wheezes or crackles Cardiovascular: Regular rate and rhythm without murmur gallop or rub normal S1 and S2, no peripheral edema or JVD Abdomen: minimal distension, bowel sounds continue to be inaudible, no rebound, no obvious ascites, no appreciable mass Musculoskeletal: No significant cyanosis, clubbing of bilateral lower extremities  Scheduled Meds:  Scheduled Meds: . enoxaparin (LOVENOX) injection  40 mg Subcutaneous Q24H  . famotidine (PEPCID) IV  20 mg Intravenous Q12H  . imipenem-cilastatin  500 mg Intravenous Q6H  . insulin aspart  0-15 Units Subcutaneous Q4H  . metoprolol  5 mg Intravenous Q6H  . sodium chloride  10-40 mL Intracatheter Q12H   Continuous Infusions: . sodium chloride 40 mL/hr at 05/18/13 0809  . Marland KitchenTPN (CLINIMIX-E) Adult 83 mL/hr at 05/18/13 0800   And  . fat emulsion 10 kcal (05/18/13 0800)  . Marland KitchenTPN (CLINIMIX-E) Adult     And  . fat  emulsion    . lactated ringers 50 mL/hr at 05/17/13 1026   Data Reviewed: Basic Metabolic Panel:  Recent Labs Lab 05/14/13 0350 05/15/13 0443 05/16/13 0455 05/17/13 0540  05/18/13 0425  NA 145 141 141 135 132*  K 3.5 4.3 3.3* 3.5 3.8  CL 103 101 102 100 99  CO2 31 30 27 26 23   GLUCOSE 128* 128* 193* 236* 274*  BUN 22 21 16 14 16   CREATININE 1.10 1.06 0.89 0.94 1.07  CALCIUM 8.1* 8.0* 7.6* 7.7* 7.0*  MG  --   --  2.1  --  1.9  PHOS  --   --  2.4  --  2.6   Liver Function Tests:  Recent Labs Lab 05/14/13 0350 05/15/13 0443 05/16/13 0455 05/17/13 0540 05/18/13 0425  AST 30 23 23 29  93*  ALT 79* 51 37 30 63*  ALKPHOS 172* 102 97 97 89  BILITOT 1.8* 1.7* 1.2 1.2 0.9  PROT 6.7 6.1 5.7* 5.8* 5.4*  ALBUMIN 2.8* 2.4* 2.2* 2.2* 1.9*    Recent Labs Lab 05/12/13 0505 05/13/13 0335 05/15/13 0443  LIPASE 732* 148* 53   CBC:  Recent Labs Lab 05/14/13 0350 05/15/13 0443 05/16/13 0455 05/17/13 0540 05/18/13 0425  WBC 15.5* 13.8* 12.0* 13.9* 18.8*  NEUTROABS  --   --  9.9*  --   --   HGB 12.5* 11.6* 11.2* 11.1* 11.0*  HCT 39.5 36.0* 33.2* 33.8* 33.3*  MCV 96.1 94.7 92.2 92.1 92.5  PLT 141* 142* 162 189 197    CBG:  Recent Labs Lab 05/17/13 1952 05/17/13 2357 05/18/13 0359 05/18/13 0755 05/18/13 1158  GLUCAP 250* 279* 238* 263* 201*    Recent Results (from the past 240 hour(s))  URINE CULTURE     Status: None   Collection Time    05/09/13  1:47 PM      Result Value Range Status   Specimen Description URINE, CLEAN CATCH   Final   Special Requests Normal   Final   Culture  Setup Time     Final   Value: 05/09/2013 22:24     Performed at [facility] Count     Final   Value: NO GROWTH     Performed at [facility]   Culture     Final   Value: NO GROWTH     Performed at [facility]   Report Status 05/10/2013 FINAL   Final  CULTURE, BLOOD (ROUTINE X 2)     Status: None   Collection Time    05/11/13  9:35 AM      Result Value Range Status   Specimen Description BLOOD RIGHT ANTECUBITAL   Final   Special Requests     Final   Value: BOTTLES DRAWN AEROBIC AND ANAEROBIC 10CC AER 5CC ANA    Culture  Setup Time     Final   Value: 05/11/2013 14:59     Performed at [facility]   Culture     Final   Value: NO GROWTH 5 DAYS     Performed at [facility]   Report Status 05/17/2013 FINAL   Final  CULTURE, BLOOD (ROUTINE X 2)     Status: None   Collection Time    05/11/13  9:50 AM      Result Value Range Status   Specimen Description BLOOD WRIST RIGHT   Final   Special Requests BOTTLES DRAWN AEROBIC AND ANAEROBIC 10CC   Final  Culture  Setup Time     Final   Value: 05/11/2013 14:59     Performed at [facility]   Culture     Final   Value: NO GROWTH 5 DAYS     Performed at [facility]   Report Status 05/17/2013 FINAL   Final     Studies:  Recent x-ray studies have been reviewed in detail by the Attending Physician  Lonia Blood, MD [facility] Office  [phone] Pager [phone]  On-Call/Text Page:      [email or web address]      password [facility]  05/18/2013, 12:36 PM

## 2013-05-18 NOTE — Progress Notes (Signed)
Progress Note   Subjective  *Continues to have abdominal pain.  No nausea or vomiting.*   Objective  Vital signs in last 24 hours: Temp:  [97.9 F (36.6 C)-101.1 F (38.4 C)] 97.9 F (36.6 C) (12/25 0757) Pulse Rate:  [99-118] 105 (12/25 0757) Resp:  [15-22] 16 (12/25 0757) BP: (120-152)/(68-97) 130/90 mmHg (12/25 0757) SpO2:  [92 %-98 %] 95 % (12/25 0757) Last BM Date: 05/14/13 General:   Alert,  Well-developed,  white male in NAD Heart:  Regular rate and rhythm; no murmurs Abdomen:  Soft and nondistended. Normal bowel sounds, without guarding, and without rebound.  Diffusely tender   Extremities:  Without edema. Neurologic:  Alert and  oriented x4;  grossly normal neurologically. Psych:  Alert and cooperative. Normal mood and affect.  Intake/Output from previous day: 12/24 0701 - 12/25 0700 In: 2431 [I.V.:480; IV Piggyback:400; TPN:1551] Out: -  Intake/Output this shift: Total I/O In: 532 [I.V.:160; TPN:372] Out: -   Lab Results:  Recent Labs  05/16/13 0455 05/17/13 0540 05/18/13 0425  WBC 12.0* 13.9* 18.8*  HGB 11.2* 11.1* 11.0*  HCT 33.2* 33.8* 33.3*  PLT 162 189 197   BMET  Recent Labs  05/16/13 0455 05/17/13 0540 05/18/13 0425  NA 141 135 132*  K 3.3* 3.5 3.8  CL 102 100 99  CO2 27 26 23   GLUCOSE 193* 236* 274*  BUN 16 14 16   CREATININE 0.89 0.94 1.07  CALCIUM 7.6* 7.7* 7.0*   LFT  Recent Labs  05/18/13 0425  PROT 5.4*  ALBUMIN 1.9*  AST 93*  ALT 63*  ALKPHOS 89  BILITOT 0.9   PT/INR No results found for this basename: LABPROT, INR,  in the last 72 hours Hepatitis Panel No results found for this basename: HEPBSAG, HCVAB, HEPAIGM, HEPBIGM,  in the last 72 hours  Studies/Results: Dg Cholangiogram Operative  05/17/2013   CLINICAL DATA:  Laparoscopic cholecystectomy with biliary pancreatitis  EXAM: INTRAOPERATIVE CHOLANGIOGRAM  COMPARISON:  CT abdomen pelvis - 05/13/2013; abdominal radiograph - 05/14/2013  FLUOROSCOPY TIME:  13  seconds  FINDINGS: Intraoperative angiographic images of the right upper abdominal quadrant during laparoscopic cholecystectomy are provided for review.  Surgical clips overlie the expected location of the gallbladder fossa.  Contrast injection demonstrates selective cannulation of the central aspect of the cystic duct.  Precontrast image demonstrates a plastic internal biliary stent overlying the expected location of the distal aspect of the common bile duct.  Contrast injection demonstrates patency of the central aspect of the cystic duct with filling of a non dilated common bile duct. There is passage of contrast though the CBD and into the descending portion of the duodenum.  There is minimal reflux of injected contrast into the common hepatic duct and central aspect of the non dilated intrahepatic biliary system. There is no definitive opacification of the pancreatic duct.  There are no discrete filling defects within the opacified portions of the biliary system to suggest the presence of choledocholithiasis.  IMPRESSION: 1. Intraoperative cholangiogram during laparoscopic cholecystectomy as above. No definite evidence of choledocholithiasis. 2. Appropriately positioned and functioning internal biliary stent within the distal aspect of the common bile duct.   Electronically Signed   By: Simonne Come M.D.   On: 05/17/2013 14:29      Assessment & Plan  *Gallstone pancreatitis status post ERCP with biliary stent placement, laparoscopic cholecystectomy.**Patient stable postop.  Active Problems:   Choledocholithiasis   Abdominal pain   Leukocytosis   Hypokalemia   AKI (  acute kidney injury)   DM (diabetes mellitus)   HTN (hypertension)   HLD (hyperlipidemia)   Pancreatitis   Elevated LFTs   Hepatic steatosis   Renal cyst   Thrombocytopenia, unspecified   Protein-calorie malnutrition, severe     LOS: 9 days   Melvia Heaps  05/18/2013, 11:01 AM

## 2013-05-18 NOTE — Progress Notes (Signed)
1 Day Post-Op  Subjective: Pt with nausea when takes more than 1-2 sips.  Not changed.  Continues to have no flatus or BM.  Denies significant pain, says pain meds are OK.    Objective: Vital signs in last 24 hours: Temp:  [97.9 F (36.6 C)-101.1 F (38.4 C)] 98.4 F (36.9 C) (12/25 0357) Pulse Rate:  [98-118] 108 (12/25 0357) Resp:  [15-27] 15 (12/25 0357) BP: (120-152)/(68-97) 131/83 mmHg (12/25 0357) SpO2:  [92 %-98 %] 96 % (12/25 0357) Last BM Date: 05/14/13  Intake/Output from previous day: 12/24 0701 - 12/25 0700 In: 2431 [I.V.:480; IV Piggyback:400; TPN:1551] Out: -  Intake/Output this shift:    General appearance: alert, cooperative and no distress Resp: breathing comfortably Cardio: regular rate and rhythm GI: soft, mild diffuse tenderness, + distended. Extremities: edema +1 diffuse  Lab Results:   Recent Labs  05/17/13 0540 05/18/13 0425  WBC 13.9* 18.8*  HGB 11.1* 11.0*  HCT 33.8* 33.3*  PLT 189 197   BMET  Recent Labs  05/17/13 0540 05/18/13 0425  NA 135 132*  K 3.5 3.8  CL 100 99  CO2 26 23  GLUCOSE 236* 274*  BUN 14 16  CREATININE 0.94 1.07  CALCIUM 7.7* 7.0*   PT/INR No results found for this basename: LABPROT, INR,  in the last 72 hours ABG No results found for this basename: PHART, PCO2, PO2, HCO3,  in the last 72 hours  Studies/Results: Dg Cholangiogram Operative  05/17/2013   CLINICAL DATA:  Laparoscopic cholecystectomy with biliary pancreatitis  EXAM: INTRAOPERATIVE CHOLANGIOGRAM  COMPARISON:  CT abdomen pelvis - 05/13/2013; abdominal radiograph - 05/14/2013  FLUOROSCOPY TIME:  13 seconds  FINDINGS: Intraoperative angiographic images of the right upper abdominal quadrant during laparoscopic cholecystectomy are provided for review.  Surgical clips overlie the expected location of the gallbladder fossa.  Contrast injection demonstrates selective cannulation of the central aspect of the cystic duct.  Precontrast image demonstrates a  plastic internal biliary stent overlying the expected location of the distal aspect of the common bile duct.  Contrast injection demonstrates patency of the central aspect of the cystic duct with filling of a non dilated common bile duct. There is passage of contrast though the CBD and into the descending portion of the duodenum.  There is minimal reflux of injected contrast into the common hepatic duct and central aspect of the non dilated intrahepatic biliary system. There is no definitive opacification of the pancreatic duct.  There are no discrete filling defects within the opacified portions of the biliary system to suggest the presence of choledocholithiasis.  IMPRESSION: 1. Intraoperative cholangiogram during laparoscopic cholecystectomy as above. No definite evidence of choledocholithiasis. 2. Appropriately positioned and functioning internal biliary stent within the distal aspect of the common bile duct.   Electronically Signed   By: Simonne Come M.D.   On: 05/17/2013 14:29    Anti-infectives: Anti-infectives   Start     Dose/Rate Route Frequency Ordered Stop   05/16/13 0200  imipenem-cilastatin (PRIMAXIN) 500 mg in sodium chloride 0.9 % 100 mL IVPB     500 mg 200 mL/hr over 30 Minutes Intravenous Every 6 hours 05/15/13 2045     05/14/13 1330  imipenem-cilastatin (PRIMAXIN) 500 mg in sodium chloride 0.9 % 100 mL IVPB  Status:  Discontinued     500 mg 200 mL/hr over 30 Minutes Intravenous Every 6 hours 05/14/13 1247 05/15/13 2045   05/09/13 2200  Ampicillin-Sulbactam (UNASYN) 3 g in sodium chloride 0.9 % 100 mL  IVPB  Status:  Discontinued     3 g 100 mL/hr over 60 Minutes Intravenous Every 6 hours 05/09/13 1733 05/13/13 1821   05/09/13 1630  Ampicillin-Sulbactam (UNASYN) 3 g in sodium chloride 0.9 % 100 mL IVPB     3 g 100 mL/hr over 60 Minutes Intravenous  Once 05/09/13 1605 05/09/13 1721      Assessment/Plan: s/p Procedure(s): LAPAROSCOPIC CHOLECYSTECTOMY WITH INTRAOPERATIVE  CHOLANGIOGRAM (N/A) Gallstone pancreatitis Gallbladder out.  Await full resolution of pancreatitis. Ileus - likely secondary to pancreatitis.  Continue TNA.  Liquids as tolerated.  Consider suppository if no BM in next 1-2 days.    LOS: 9 days    Sejal Cofield 05/18/2013

## 2013-05-18 NOTE — Progress Notes (Signed)
PARENTERAL NUTRITION CONSULT NOTE - FOLLOW UP  Pharmacy Consult:  TPN Indication:  Severe pancreatitis  No Known Allergies  Patient Measurements: Height: 5\' 6"  (167.6 cm) Weight: 166 lb 10.7 oz (75.6 kg) IBW/kg (Calculated) : 63.8  Vital Signs: Temp: 98.4 F (36.9 C) (12/25 0357) Temp src: Oral (12/25 0357) BP: 131/83 mmHg (12/25 0357) Pulse Rate: 108 (12/25 0357) Intake/Output from previous day: 12/24 0701 - 12/25 0700 In: 2431 [I.V.:480; IV Piggyback:400; TPN:1551] Out: -   Labs:  Recent Labs  05/16/13 0455 05/17/13 0540 05/18/13 0425  WBC 12.0* 13.9* 18.8*  HGB 11.2* 11.1* 11.0*  HCT 33.2* 33.8* 33.3*  PLT 162 189 197     Recent Labs  05/16/13 0435 05/16/13 0455 05/17/13 0540 05/18/13 0425  NA  --  141 135 132*  K  --  3.3* 3.5 3.8  CL  --  102 100 99  CO2  --  27 26 23   GLUCOSE  --  193* 236* 274*  BUN  --  16 14 16   CREATININE  --  0.89 0.94 1.07  CALCIUM  --  7.6* 7.7* 7.0*  MG  --  2.1  --  1.9  PHOS  --  2.4  --  2.6  PROT  --  5.7* 5.8* 5.4*  ALBUMIN  --  2.2* 2.2* 1.9*  AST  --  23 29 93*  ALT  --  37 30 63*  ALKPHOS  --  97 97 89  BILITOT  --  1.2 1.2 0.9  PREALBUMIN  --  5.4*  --   --   TRIG 157*  --   --   --    Estimated Creatinine Clearance: 78.7 ml/min (by C-G formula based on Cr of 1.07).    Recent Labs  05/17/13 1952 05/17/13 2357 05/18/13 0359  GLUCAP 250* 279* 238*     Insulin Requirements in the past 24 hours:  24 units SSI  Assessment: 47 YOM admitted 05/09/13 with abdominal pain from pancreatitis due to choledocholithiasis and is now s/p ERCP and stenting on 05/10/13.  Post-op, he experienced abdominal distention and pain. CT showed possible necrosis and surgery is delayed until his gallstone pancreatitis improves.  Pharmacy consulted to manage TPN for severe/necrotizing pancreatitis and prolonged NPO status.  GI: Severe/necrotizing pancreatitis + ileus - lipase down to WNL, NGT placed 12/20 and pt pulled out  12/24. Passing flatus on 12/23 AM but still with mid-abd pain and discomfort.  S/p cholecystectomy 12/24 and started on clear liquid diet.  On Pepcid, Compazine  Endo: Hx DM, CBGs remain uncontrolled (238-325) on SSI + insulin in TPN  Lytes: mild hyponatremia, K+ 3.8 (goal ~4 ileus), others WNL  Renal: SCr up 1.07, CrCl 79 ml/min, LR at 50 ml/hr  Pulm: 92-96% on 4L [place]  Cards: Hx HTN/DL - BP controlled, ST - on IV Lopressor.   Hepatobil: LFTs trending up, tbili normalized, albumin decreased to 1.9, TG 157 (12/23)  Neuro: Toradol + PRN Dilaudid - last pain score 0  ID: Primaxin D#5 (total abx D#10) for empiric pancreatitis coverage.  Tmax 101.1, WBC increased to 18.9.  All cx NG or NGTD  Best Practices: Lovenox  TPN Access: R-PICC line placed 12/22  TPN day#: 3 (12/22 >> current)  Current Nutrition:  Clear liquid diet Clinimix E 5/15 at 83 ml/hr + IVFE 20% at 10 ml/hr provides 1894 kcal and 99.6 grams of protein * Goal rate of Clinimix E 5/15 at 100 ml/hr + IVFE 20% at 10  ml/hr will provide 2184 kcal and 120 grams of protein per day  Nutritional Goals:  2250-2450 kCal, 115-125 grams of protein daily   Plan:   - Continue Clinimix E 5/15 at 83 ml/hr + IVFE 20% at 10 ml/hr.  Will not advance to goal rate due to lack of glucose control - Daily multivitamin and trace elements - Increase regular insulin in TPN to 70 units - KCL x 2 runs - F/U daily    Talha Iser D. Laney Potash, PharmD, BCPS Pager:  [phone] 05/18/2013, 7:18 AM

## 2013-05-19 LAB — GLUCOSE, CAPILLARY
Glucose-Capillary: 234 mg/dL — ABNORMAL HIGH (ref 70–99)
Glucose-Capillary: 226 mg/dL — ABNORMAL HIGH (ref 70–99)
Glucose-Capillary: 310 mg/dL — ABNORMAL HIGH (ref 70–99)

## 2013-05-19 LAB — CBC
Hemoglobin: 10.8 g/dL — ABNORMAL LOW (ref 13.0–17.0)
MCHC: 33.4 g/dL (ref 30.0–36.0)
Platelets: 208 10*3/uL (ref 150–400)
RBC: 3.52 MIL/uL — ABNORMAL LOW (ref 4.22–5.81)
RDW: 13.2 % (ref 11.5–15.5)
WBC: 20 10*3/uL — ABNORMAL HIGH (ref 4.0–10.5)

## 2013-05-19 LAB — COMPREHENSIVE METABOLIC PANEL
ALT: 64 U/L — ABNORMAL HIGH (ref 0–53)
Alkaline Phosphatase: 95 U/L (ref 39–117)
Creatinine, Ser: 0.99 mg/dL (ref 0.50–1.35)
GFR calc Af Amer: >90 mL/min (ref >90)
Glucose, Bld: 252 mg/dL — ABNORMAL HIGH (ref 70–99)
Potassium: 3.8 mEq/L (ref 3.5–5.1)
Sodium: 131 mEq/L — ABNORMAL LOW (ref 135–145)
Total Bilirubin: 1 mg/dL (ref 0.3–1.2)
Total Protein: 5.6 g/dL — ABNORMAL LOW (ref 6.0–8.3)

## 2013-05-19 MED ORDER — M.V.I. ADULT IV INJ
INTRAVENOUS | Status: AC
  Administered 2013-05-19: 18:00:00 via INTRAVENOUS
  Filled 2013-05-19: qty 2000

## 2013-05-19 MED ORDER — CHLORPROMAZINE HCL 25 MG/ML IJ SOLN
50.0000 mg | Freq: Three times a day (TID) | INTRAMUSCULAR | Status: DC | PRN
  Administered 2013-05-19: 50 mg via INTRAMUSCULAR
  Filled 2013-05-19 (×2): qty 2

## 2013-05-19 MED ORDER — HYDROMORPHONE HCL PF 1 MG/ML IJ SOLN: 1.0000 mg | INTRAMUSCULAR | Status: DC | PRN

## 2013-05-19 MED ORDER — OXYCODONE-ACETAMINOPHEN 5-325 MG PO TABS
1.0000 | ORAL_TABLET | ORAL | Status: DC | PRN
  Administered 2013-05-19: 2 via ORAL
  Administered 2013-05-19: 1 via ORAL
  Administered 2013-05-19 – 2013-05-20 (×6): 2 via ORAL
  Administered 2013-05-21 – 2013-05-25 (×7): 1 via ORAL
  Filled 2013-05-19: qty 2
  Filled 2013-05-19 (×2): qty 1
  Filled 2013-05-19: qty 2
  Filled 2013-05-19 (×2): qty 1
  Filled 2013-05-19 (×5): qty 2
  Filled 2013-05-19 (×2): qty 1
  Filled 2013-05-19: qty 2
  Filled 2013-05-19 (×2): qty 1

## 2013-05-19 MED ORDER — FAT EMULSION 20 % IV EMUL
250.0000 mL | INTRAVENOUS | Status: AC
  Administered 2013-05-19: 250 mL via INTRAVENOUS
  Filled 2013-05-19: qty 250

## 2013-05-19 NOTE — Progress Notes (Signed)
[facility] [place] TEAM 1 - Stepdown/ICU TEAM  Pt seen earlier today by Dr. Lindie Spruce, who has offered to assume Attending role.  Electrolyte and CBG issues are being addressed by Pharmacy.  I agree with transfer to med/surg bed and to Gen Surg service.  TR[facility] will sign off.  Please feel free to call should you have any further questions.    Lonia Blood, MD [facility] Office  [phone] Pager [phone]  On-Call/Text Page:      [email or web address]      password [facility]

## 2013-05-19 NOTE — Progress Notes (Signed)
Attempted to call report to receiving nurse on 6N.  My name and direct phone number was left with the unit secretary for the nurse to return the call.

## 2013-05-19 NOTE — Progress Notes (Signed)
Subjective: No acute events.  Feeling better.    Objective: Vital signs in last 24 hours: Temp:  [98 F (36.7 C)-101 F (38.3 C)] 98 F (36.7 C) (12/26 0716) Pulse Rate:  [87-110] 92 (12/26 0716) Resp:  [17-33] 17 (12/26 0716) BP: (117-144)/(68-89) 134/89 mmHg (12/26 0716) SpO2:  [93 %-99 %] 99 % (12/26 0716) Last BM Date: 05/14/13  Intake/Output from previous day: 12/25 0701 - 12/26 0700 In: 3162.7 [P.O.:240; I.V.:625.7; IV Piggyback:550; TPN:1747] Out: -  Intake/Output this shift:    General appearance: alert and no distress GI: tender at the incision sites.  Lab Results:  Recent Labs  05/17/13 0540 05/18/13 0425 05/19/13 0500  WBC 13.9* 18.8* 20.0*  HGB 11.1* 11.0* 10.8*  HCT 33.8* 33.3* 32.3*  PLT 189 197 208   BMET  Recent Labs  05/17/13 0540 05/18/13 0425 05/19/13 0500  NA 135 132* 131*  K 3.5 3.8 3.8  CL 100 99 97  CO2 26 23 25   GLUCOSE 236* 274* 252*  BUN 14 16 14   CREATININE 0.94 1.07 0.99  CALCIUM 7.7* 7.0* 7.2*   LFT  Recent Labs  05/19/13 0500  PROT 5.6*  ALBUMIN 1.9*  AST 71*  ALT 64*  ALKPHOS 95  BILITOT 1.0   PT/INR No results found for this basename: LABPROT, INR,  in the last 72 hours Hepatitis Panel No results found for this basename: HEPBSAG, HCVAB, HEPAIGM, HEPBIGM,  in the last 72 hours C-Diff No results found for this basename: CDIFFTOX,  in the last 72 hours Fecal Lactopherrin No results found for this basename: FECLLACTOFRN,  in the last 72 hours  Studies/Results: Dg Cholangiogram Operative  05/17/2013   CLINICAL DATA:  Laparoscopic cholecystectomy with biliary pancreatitis  EXAM: INTRAOPERATIVE CHOLANGIOGRAM  COMPARISON:  CT abdomen pelvis - 05/13/2013; abdominal radiograph - 05/14/2013  FLUOROSCOPY TIME:  13 seconds  FINDINGS: Intraoperative angiographic images of the right upper abdominal quadrant during laparoscopic cholecystectomy are provided for review.  Surgical clips overlie the expected location of the  gallbladder fossa.  Contrast injection demonstrates selective cannulation of the central aspect of the cystic duct.  Precontrast image demonstrates a plastic internal biliary stent overlying the expected location of the distal aspect of the common bile duct.  Contrast injection demonstrates patency of the central aspect of the cystic duct with filling of a non dilated common bile duct. There is passage of contrast though the CBD and into the descending portion of the duodenum.  There is minimal reflux of injected contrast into the common hepatic duct and central aspect of the non dilated intrahepatic biliary system. There is no definitive opacification of the pancreatic duct.  There are no discrete filling defects within the opacified portions of the biliary system to suggest the presence of choledocholithiasis.  IMPRESSION: 1. Intraoperative cholangiogram during laparoscopic cholecystectomy as above. No definite evidence of choledocholithiasis. 2. Appropriately positioned and functioning internal biliary stent within the distal aspect of the common bile duct.   Electronically Signed   By: Simonne Come M.Chandler.   On: 05/17/2013 14:29    Medications:  Scheduled: . enoxaparin (LOVENOX) injection  40 mg Subcutaneous Q24H  . famotidine (PEPCID) IV  20 mg Intravenous Q12H  . imipenem-cilastatin  500 mg Intravenous Q6H  . insulin aspart  0-15 Units Subcutaneous Q4H  . metoprolol  5 mg Intravenous Q6H  . sodium chloride  10-40 mL Intracatheter Q12H   Continuous: . sodium chloride 40 mL/hr at 05/19/13 0200  . Marland KitchenTPN (CLINIMIX-E) Adult 83 mL/hr  at 05/19/13 0200   And  . fat emulsion 250 mL (05/19/13 0200)  . Marland KitchenTPN (CLINIMIX-E) Adult     And  . fat emulsion      Assessment/Plan: 1) Acute severe gallstone pancreatitis. 2) Fever - Tmax 101. 3) Increased WBC.   The patient spiked a fever and his WBC increased.  Uncertain about the source of the change.  Clinically he looks well.  He is still on  Imipenem.  Plan: 1) Continue with antibiotics. 2) Follow temperature and WBC. 3) Continue with clear liquids.  LOS: 10 days   Nicolas Chandler 05/19/2013, 8:13 AM

## 2013-05-19 NOTE — Progress Notes (Signed)
[facility] Progress Note 2 Days Post-Op  Subjective: Patient looks so much better today.  Passing gas and had bowel movement yesterday.  Objective: Vital signs in last 24 hours: Temp:  [98 F (36.7 C)-101 F (38.3 C)] 98 F (36.7 C) (12/26 0716) Pulse Rate:  [87-110] 92 (12/26 0716) Resp:  [17-33] 17 (12/26 0716) BP: (117-144)/(68-89) 134/89 mmHg (12/26 0716) SpO2:  [93 %-99 %] 99 % (12/26 0716) Last BM Date: 05/14/13  Intake/Output from previous day: 12/25 0701 - 12/26 0700 In: 3162.7 [P.O.:240; I.V.:625.7; IV Piggyback:550; TPN:1747] Out: -  Intake/Output this shift:    General: No acute distress  Lungs: Clear  Abd: Soft, good bowel sounds.  Having hiccoughs.  Extremities: No DVt signs or symptoms  Neuro: Intact  Lab Results:  @LABLAST2 (wbc:2,hgb:2,hct:2,plt:2) BMET  Recent Labs  05/18/13 0425 05/19/13 0500  NA 132* 131*  K 3.8 3.8  CL 99 97  CO2 23 25  GLUCOSE 274* 252*  BUN 16 14  CREATININE 1.07 0.99  CALCIUM 7.0* 7.2*   PT/INR No results found for this basename: LABPROT, INR,  in the last 72 hours ABG No results found for this basename: PHART, PCO2, PO2, HCO3,  in the last 72 hours  Studies/Results: Dg Cholangiogram Operative  05/17/2013   CLINICAL DATA:  Laparoscopic cholecystectomy with biliary pancreatitis  EXAM: INTRAOPERATIVE CHOLANGIOGRAM  COMPARISON:  CT abdomen pelvis - 05/13/2013; abdominal radiograph - 05/14/2013  FLUOROSCOPY TIME:  13 seconds  FINDINGS: Intraoperative angiographic images of the right upper abdominal quadrant during laparoscopic cholecystectomy are provided for review.  Surgical clips overlie the expected location of the gallbladder fossa.  Contrast injection demonstrates selective cannulation of the central aspect of the cystic duct.  Precontrast image demonstrates a plastic internal biliary stent overlying the expected location of the distal aspect of the common bile duct.  Contrast injection demonstrates patency of the  central aspect of the cystic duct with filling of a non dilated common bile duct. There is passage of contrast though the CBD and into the descending portion of the duodenum.  There is minimal reflux of injected contrast into the common hepatic duct and central aspect of the non dilated intrahepatic biliary system. There is no definitive opacification of the pancreatic duct.  There are no discrete filling defects within the opacified portions of the biliary system to suggest the presence of choledocholithiasis.  IMPRESSION: 1. Intraoperative cholangiogram during laparoscopic cholecystectomy as above. No definite evidence of choledocholithiasis. 2. Appropriately positioned and functioning internal biliary stent within the distal aspect of the common bile duct.   Electronically Signed   By: Simonne Come M.D.   On: 05/17/2013 14:29    Anti-infectives: Anti-infectives   Start     Dose/Rate Route Frequency Ordered Stop   05/16/13 0200  imipenem-cilastatin (PRIMAXIN) 500 mg in sodium chloride 0.9 % 100 mL IVPB  Status:  Discontinued     500 mg 200 mL/hr over 30 Minutes Intravenous Every 6 hours 05/15/13 2045 05/19/13 0836   05/14/13 1330  imipenem-cilastatin (PRIMAXIN) 500 mg in sodium chloride 0.9 % 100 mL IVPB  Status:  Discontinued     500 mg 200 mL/hr over 30 Minutes Intravenous Every 6 hours 05/14/13 1247 05/15/13 2045   05/09/13 2200  Ampicillin-Sulbactam (UNASYN) 3 g in sodium chloride 0.9 % 100 mL IVPB  Status:  Discontinued     3 g 100 mL/hr over 60 Minutes Intravenous Every 6 hours 05/09/13 1733 05/13/13 1821   05/09/13 1630  Ampicillin-Sulbactam (UNASYN) 3 g in  sodium chloride 0.9 % 100 mL IVPB     3 g 100 mL/hr over 60 Minutes Intravenous  Once 05/09/13 1605 05/09/13 1721      Assessment/Plan: s/p Procedure(s): LAPAROSCOPIC CHOLECYSTECTOMY WITH INTRAOPERATIVE CHOLANGIOGRAM Advance diet Transfer to 6N.  Can transfer to our service.  LOS: 10 days   Marta Lamas. Gae Bon, MD,  FACS [phone] [facility] Surgery 05/19/2013

## 2013-05-19 NOTE — Progress Notes (Signed)
Report was given to rhe receiving nurse, Tawni Millers, RN.  Updated on patient history and current status.  Patient is stable at this time and is ready for transfer to 6N04.

## 2013-05-19 NOTE — Progress Notes (Signed)
PARENTERAL NUTRITION CONSULT NOTE - FOLLOW UP  Pharmacy Consult:  TPN Indication:  Severe pancreatitis  No Known Allergies  Patient Measurements: Height: 5\' 6"  (167.6 cm) Weight: 166 lb 10.7 oz (75.6 kg) IBW/kg (Calculated) : 63.8  Vital Signs: Temp: 98.9 F (37.2 C) (12/26 0502) Temp src: Oral (12/26 0502) BP: 117/68 mmHg (12/26 0502) Pulse Rate: 87 (12/26 0502) Intake/Output from previous day: 12/25 0701 - 12/26 0700 In: 3162.7 [P.O.:240; I.V.:625.7; IV Piggyback:550; TPN:1747] Out: -   Labs:  Recent Labs  05/17/13 0540 05/18/13 0425 05/19/13 0500  WBC 13.9* 18.8* 20.0*  HGB 11.1* 11.0* 10.8*  HCT 33.8* 33.3* 32.3*  PLT 189 197 208     Recent Labs  05/17/13 0540 05/18/13 0425 05/19/13 0500  NA 135 132* 131*  K 3.5 3.8 3.8  CL 100 99 97  CO2 26 23 25   GLUCOSE 236* 274* 252*  BUN 14 16 14   CREATININE 0.94 1.07 0.99  CALCIUM 7.7* 7.0* 7.2*  MG  --  1.9  --   PHOS  --  2.6  --   PROT 5.8* 5.4* 5.6*  ALBUMIN 2.2* 1.9* 1.9*  AST 29 93* 71*  ALT 30 63* 64*  ALKPHOS 97 89 95  BILITOT 1.2 0.9 1.0   Estimated Creatinine Clearance: 85 ml/min (by C-G formula based on Cr of 0.99).    Recent Labs  05/18/13 1951 05/18/13 2341 05/19/13 0501  GLUCAP 247* 259* 232*     Insulin Requirements in the past 24 hours:  36 units SSI  Assessment: 47 YOM admitted 05/09/13 with abdominal pain from pancreatitis due to choledocholithiasis and is now s/p ERCP and stenting on 05/10/13.  Post-op, he experienced abdominal distention and pain. CT showed possible necrosis and surgery is delayed until his gallstone pancreatitis improves.  Pharmacy consulted to manage TPN for severe/necrotizing pancreatitis and prolonged NPO status.  Patient's PO intake remains insufficient to wean off of TPN.  GI: Severe/necrotizing pancreatitis + ileus (resolving) - lipase down to WNL, NGT placed 12/20 and pt pulled out 12/24. Passing flatus on 12/23 AM but still with mid-abd pain and  discomfort.  S/p cholecystectomy 12/24 and started on clear liquid diet (still with minimal intake).  On Pepcid, Compazine Endo: Hx DM, CBGs remain uncontrolled (218-259) on SSI + insulin in TPN Lytes: mild hyponatremia, K+ 3.8, Na+ 131 Renal: SCr down 0.99, CrCl 85 ml/min, LR at 50 ml/hr Pulm: 93-99% on 2L [place] Cards: Hx HTN/DL - BP controlled, ST resolving - on IV Lopressor.  Hepatobil: LFTs mildly elevated and trending down, tbili normalized, albumin 1.9, TG 157 (12/23) Neuro: Toradol + PRN Dilaudid - last pain score 0 ID: Primaxin D#6 (total abx D#11) for empiric pancreatitis coverage.  Afebrile, WBC increased to 20.  All cx NG or NGTD Best Practices: Lovenox TPN Access: R-PICC line placed 12/22 TPN day#: 4 (12/22 >> current)  Current Nutrition:  Clear liquid diet Clinimix E 5/15 at 83 ml/hr + IVFE 20% at 10 ml/hr provides 1894 kcal and 99.6 grams of protein * Goal rate of Clinimix E 5/15 at 100 ml/hr + IVFE 20% at 10 ml/hr will provide 2184 kcal and 120 grams of protein per day  Nutritional Goals:  2250-2450 kCal, 115-125 grams of protein daily   Plan:   - Continue Clinimix E 5/15 at 83 ml/hr + IVFE 20% at 10 ml/hr.  Will not advance to goal rate due to uncontrolled CBGs - Daily multivitamin and trace elements - Increase regular insulin in TPN to  100 units - F/U PO intake / diet advancement to wean off of TPN    Delane Stalling D. Laney Potash, PharmD, BCPS Pager:  [phone] 05/19/2013, 7:36 AM

## 2013-05-20 ENCOUNTER — Inpatient Hospital Stay (HOSPITAL_COMMUNITY): Payer: BC Managed Care – PPO

## 2013-05-20 DIAGNOSIS — E46 Unspecified protein-calorie malnutrition: Secondary | ICD-10-CM

## 2013-05-20 LAB — CBC
HCT: 32.4 % — ABNORMAL LOW (ref 39.0–52.0)
Hemoglobin: 11 g/dL — ABNORMAL LOW (ref 13.0–17.0)
MCV: 91.8 fL (ref 78.0–100.0)
WBC: 18.4 10*3/uL — ABNORMAL HIGH (ref 4.0–10.5)

## 2013-05-20 LAB — GLUCOSE, CAPILLARY
Glucose-Capillary: 170 mg/dL — ABNORMAL HIGH (ref 70–99)
Glucose-Capillary: 176 mg/dL — ABNORMAL HIGH (ref 70–99)
Glucose-Capillary: 177 mg/dL — ABNORMAL HIGH (ref 70–99)
Glucose-Capillary: 202 mg/dL — ABNORMAL HIGH (ref 70–99)
Glucose-Capillary: 211 mg/dL — ABNORMAL HIGH (ref 70–99)

## 2013-05-20 LAB — COMPREHENSIVE METABOLIC PANEL
Albumin: 1.9 g/dL — ABNORMAL LOW (ref 3.5–5.2)
Alkaline Phosphatase: 105 U/L (ref 39–117)
BUN: 15 mg/dL (ref 6–23)
CO2: 23 mEq/L (ref 19–32)
Chloride: 100 mEq/L (ref 96–112)
GFR calc Af Amer: >90 mL/min (ref >90)
GFR calc non Af Amer: >90 mL/min (ref >90)
Glucose, Bld: 196 mg/dL — ABNORMAL HIGH (ref 70–99)
Potassium: 3.9 mEq/L (ref 3.5–5.1)
Total Bilirubin: 0.7 mg/dL (ref 0.3–1.2)
Total Protein: 5.9 g/dL — ABNORMAL LOW (ref 6.0–8.3)

## 2013-05-20 LAB — URINALYSIS, ROUTINE W REFLEX MICROSCOPIC
Bilirubin Urine: NEGATIVE
Glucose, UA: NEGATIVE mg/dL
Hgb urine dipstick: NEGATIVE
Leukocytes, UA: NEGATIVE
Protein, ur: NEGATIVE mg/dL
Specific Gravity, Urine: 1.019 (ref 1.005–1.030)

## 2013-05-20 MED ORDER — METFORMIN HCL 500 MG PO TABS
500.0000 mg | ORAL_TABLET | Freq: Two times a day (BID) | ORAL | Status: DC
  Administered 2013-05-20 – 2013-05-21 (×2): 500 mg via ORAL
  Filled 2013-05-20 (×4): qty 1

## 2013-05-20 MED ORDER — SIMVASTATIN 20 MG PO TABS
20.0000 mg | ORAL_TABLET | Freq: Every day | ORAL | Status: DC
  Administered 2013-05-20 – 2013-05-21 (×2): 20 mg via ORAL
  Filled 2013-05-20 (×2): qty 1

## 2013-05-20 MED ORDER — TRACE MINERALS CR-CU-F-FE-I-MN-MO-SE-ZN IV SOLN
INTRAVENOUS | Status: DC
  Filled 2013-05-20: qty 1000

## 2013-05-20 MED ORDER — INSULIN ASPART 100 UNIT/ML ~~LOC~~ SOLN
0.0000 [IU] | Freq: Three times a day (TID) | SUBCUTANEOUS | Status: DC
  Administered 2013-05-20 (×2): 3 [IU] via SUBCUTANEOUS
  Administered 2013-05-21: 2 [IU] via SUBCUTANEOUS
  Administered 2013-05-21: 3 [IU] via SUBCUTANEOUS
  Administered 2013-05-21: 5 [IU] via SUBCUTANEOUS
  Administered 2013-05-22 (×2): 3 [IU] via SUBCUTANEOUS
  Administered 2013-05-22: 2 [IU] via SUBCUTANEOUS
  Administered 2013-05-23 – 2013-05-24 (×2): 3 [IU] via SUBCUTANEOUS

## 2013-05-20 MED ORDER — INSULIN ASPART 100 UNIT/ML ~~LOC~~ SOLN: 0.0000 [IU] | Freq: Every day | SUBCUTANEOUS | Status: DC

## 2013-05-20 MED ORDER — FAT EMULSION 20 % IV EMUL
250.0000 mL | INTRAVENOUS | Status: DC
  Filled 2013-05-20: qty 250

## 2013-05-20 MED ORDER — GLIMEPIRIDE 4 MG PO TABS
4.0000 mg | ORAL_TABLET | Freq: Two times a day (BID) | ORAL | Status: DC
  Administered 2013-05-20 – 2013-05-21 (×2): 4 mg via ORAL
  Filled 2013-05-20 (×4): qty 1

## 2013-05-20 MED ORDER — INSULIN ASPART 100 UNIT/ML ~~LOC~~ SOLN: 0.0000 [IU] | Freq: Three times a day (TID) | SUBCUTANEOUS | Status: DC

## 2013-05-20 MED ORDER — OXYCODONE-ACETAMINOPHEN 5-325 MG PO TABS: 1.0000 | ORAL_TABLET | Freq: Four times a day (QID) | ORAL | Status: DC | PRN

## 2013-05-20 MED ORDER — LISINOPRIL 5 MG PO TABS
5.0000 mg | ORAL_TABLET | Freq: Every day | ORAL | Status: DC
  Administered 2013-05-20 – 2013-05-24 (×5): 5 mg via ORAL
  Filled 2013-05-20 (×7): qty 1

## 2013-05-20 MED ORDER — SODIUM CHLORIDE 0.9 % IV SOLN
500.0000 mg | Freq: Four times a day (QID) | INTRAVENOUS | Status: DC
  Administered 2013-05-20 – 2013-05-25 (×20): 500 mg via INTRAVENOUS
  Filled 2013-05-20 (×22): qty 500

## 2013-05-20 NOTE — Progress Notes (Signed)
3 Days Post-Op  Subjective: Pt feels well.  No N/V.  Having BM's and tolerating full liquids this am.  WBC not check this am, but 20 yesterday.  His Primaxin was turned off yesterday, unsure why.  Ambulating well.  Feels pretty good and wants to go home.  Objective: Vital signs in last 24 hours: Temp:  [97.4 F (36.3 C)-98.7 F (37.1 C)] 98.4 F (36.9 C) (12/27 0530) Pulse Rate:  [80-100] 100 (12/27 0530) Resp:  [18-20] 18 (12/27 0530) BP: (105-135)/(67-82) 134/72 mmHg (12/27 0530) SpO2:  [96 %-100 %] 98 % (12/27 0530) Last BM Date: 05/14/13  Intake/Output from previous day: 12/26 0701 - 12/27 0700 In: 3442.5 [P.O.:480; I.V.:455; TPN:2507.5] Out: -  Intake/Output this shift:    PE: Gen:  Alert, NAD, pleasant Abd: Soft, NT/ND, +BS, no HSM, incisions C/D/I  Lab Results:   Recent Labs  05/18/13 0425 05/19/13 0500  WBC 18.8* 20.0*  HGB 11.0* 10.8*  HCT 33.3* 32.3*  PLT 197 208   BMET  Recent Labs  05/19/13 0500 05/20/13 0520  NA 131* 133*  K 3.8 3.9  CL 97 100  CO2 25 23  GLUCOSE 252* 196*  BUN 14 15  CREATININE 0.99 0.97  CALCIUM 7.2* 7.6*   PT/INR No results found for this basename: LABPROT, INR,  in the last 72 hours CMP     Component Value Date/Time   NA 133* 05/20/2013 0520   K 3.9 05/20/2013 0520   CL 100 05/20/2013 0520   CO2 23 05/20/2013 0520   GLUCOSE 196* 05/20/2013 0520   BUN 15 05/20/2013 0520   CREATININE 0.97 05/20/2013 0520   CALCIUM 7.6* 05/20/2013 0520   PROT 5.9* 05/20/2013 0520   ALBUMIN 1.9* 05/20/2013 0520   AST 40* 05/20/2013 0520   ALT 53 05/20/2013 0520   ALKPHOS 105 05/20/2013 0520   BILITOT 0.7 05/20/2013 0520   GFRNONAA >90 05/20/2013 0520   GFRAA >90 05/20/2013 0520   Lipase     Component Value Date/Time   LIPASE 53 05/15/2013 0443       Studies/Results: No results found.  Anti-infectives: Anti-infectives   Start     Dose/Rate Route Frequency Ordered Stop   05/16/13 0200  imipenem-cilastatin (PRIMAXIN)  500 mg in sodium chloride 0.9 % 100 mL IVPB  Status:  Discontinued     500 mg 200 mL/hr over 30 Minutes Intravenous Every 6 hours 05/15/13 2045 05/19/13 0836   05/14/13 1330  imipenem-cilastatin (PRIMAXIN) 500 mg in sodium chloride 0.9 % 100 mL IVPB  Status:  Discontinued     500 mg 200 mL/hr over 30 Minutes Intravenous Every 6 hours 05/14/13 1247 05/15/13 2045   05/09/13 2200  Ampicillin-Sulbactam (UNASYN) 3 g in sodium chloride 0.9 % 100 mL IVPB  Status:  Discontinued     3 g 100 mL/hr over 60 Minutes Intravenous Every 6 hours 05/09/13 1733 05/13/13 1821   05/09/13 1630  Ampicillin-Sulbactam (UNASYN) 3 g in sodium chloride 0.9 % 100 mL IVPB     3 g 100 mL/hr over 60 Minutes Intravenous  Once 05/09/13 1605 05/09/13 1721       Assessment/Plan POD #3 s/p lap chole with IOC for biliary pancreatitis Post-operative ileus - resolving PCM on TNA 1.  On 6N 2.  Advance diet to soft 3.  Ambulate and IS 4.  SCD's and Lovenox 5.  Unsure of why the antibiotics were discontinued, but his fevers have resolved 6.  Recheck CBC is still 18,000, so will re-start  primaxin 7.  Wean TNA to off, resume all home meds 8.  Keep overnight to monitor WBC, ordered CXR and urinalysis    LOS: 11 days    DORT, Karletta Millay 05/20/2013, 7:51 AM Pager: [phone]

## 2013-05-20 NOTE — Progress Notes (Addendum)
PARENTERAL NUTRITION CONSULT NOTE - FOLLOW UP  Pharmacy Consult:  TPN Indication:  Severe pancreatitis  No Known Allergies  Patient Measurements: Height: 5\' 6"  (167.6 cm) Weight: 166 lb 10.7 oz (75.6 kg) IBW/kg (Calculated) : 63.8  Vital Signs: Temp: 98.4 F (36.9 C) (12/27 0530) Temp src: Oral (12/27 0530) BP: 134/72 mmHg (12/27 0530) Pulse Rate: 100 (12/27 0530) Intake/Output from previous day: 12/26 0701 - 12/27 0700 In: 3442.5 [P.O.:480; I.V.:455; TPN:2507.5] Out: -   Labs:  Recent Labs  05/18/13 0425 05/19/13 0500  WBC 18.8* 20.0*  HGB 11.0* 10.8*  HCT 33.3* 32.3*  PLT 197 208     Recent Labs  05/18/13 0425 05/19/13 0500 05/20/13 0520  NA 132* 131* 133*  K 3.8 3.8 3.9  CL 99 97 100  CO2 23 25 23   GLUCOSE 274* 252* 196*  BUN 16 14 15   CREATININE 1.07 0.99 0.97  CALCIUM 7.0* 7.2* 7.6*  MG 1.9  --   --   PHOS 2.6  --   --   PROT 5.4* 5.6* 5.9*  ALBUMIN 1.9* 1.9* 1.9*  AST 93* 71* 40*  ALT 63* 64* 53  ALKPHOS 89 95 105  BILITOT 0.9 1.0 0.7   Estimated Creatinine Clearance: 86.8 ml/min (by C-G formula based on Cr of 0.97).    Recent Labs  05/20/13 0010 05/20/13 0410 05/20/13 0719  GLUCAP 211* 202* 177*     Insulin Requirements in the past 24 hours:  42 units SSI + 100 units regular insulin in TPN  Assessment: 47 YOM admitted 05/09/13 with abdominal pain from pancreatitis due to choledocholithiasis and is now s/p ERCP and stenting on 05/10/13.  Post-op, he experienced abdominal distention and pain. CT showed possible necrosis and surgery is delayed until his gallstone pancreatitis improves.  Pharmacy consulted to manage TPN for severe/necrotizing pancreatitis and prolonged NPO status.  Patient's PO intake is improving and MD/PA wishes to start weaning TPN.  GI: Severe/necrotizing pancreatitis + ileus (resolving) - lipase down to WNL, NGT placed 12/20 and pt pulled out 12/24. Passing flatus on 12/23 AM but still with mid-abd pain and  discomfort.  S/p cholecystectomy 12/24 and diet advanced to fat modified (ate 50-75% of full liquid diet) Endo: Hx DM, CBGs improving (177-310), on SSI + insulin in TPN Lytes: mild hyponatremia, others WNL Renal: SCr down 0.97, CrCl 87 ml/min, NS at [facility] Pulm: [place] >> RA Cards: Hx HTN/DL - VSS - on IV Lopressor Hepatobil: LFTs morlaizing, tbili WNL, albumin 1.9, TG 157 (12/23) Neuro: Toradol + PRN Dilaudid - last pain score 0 ID: s/p 6d Primaxin (total abx D#11) for empiric pancreatitis coverage.  Afebrile, WBC increased to 20.  All cx NG or NGTD Best Practices: Lovenox TPN Access: R-PICC line placed 12/22 TPN day#: 5 (12/22 >> current)  Current Nutrition:  Fat modified diet Clinimix E 5/15 at 83 ml/hr + IVFE 20% at 10 ml/hr provides 1894 kcal and 99.6 grams of protein * Goal rate of Clinimix E 5/15 at 100 ml/hr + IVFE 20% at 10 ml/hr will provide 2184 kcal and 120 grams of protein per day  Nutritional Goals:  2250-2450 kCal, 115-125 grams of protein daily   Plan:   - Decrease Clinimix E 5/15 to 20 ml/hr and continue IVFE at 10 ml/hr, then stop at 1800or at discharge per discussion with PA - PA to resume home anti-diabetic medications - Change SSI to [facility] D. Laney Potash, PharmD, BCPS Pager:  [phone] 05/20/2013, 8:33  AM    

## 2013-05-20 NOTE — Progress Notes (Signed)
          Daily Rounding Note  05/20/2013, 2:30 PM  LOS: 11 days   SUBJECTIVE:       Tolerated clears and had solid lunch, this caused some burning reflux type sxs but no nausea and no worsening of abdominal pain.  Taking Oxycodone for pain. 3 doses of 2 tablets each so far today.  Last BM was yesterday.   OBJECTIVE:         Vital signs in last 24 hours:    Temp:  [98.4 F (36.9 C)-98.7 F (37.1 C)] 98.4 F (36.9 C) (12/27 0530) Pulse Rate:  [94-100] 100 (12/27 0530) Resp:  [18] 18 (12/27 0530) BP: (106-134)/(67-75) 134/72 mmHg (12/27 0530) SpO2:  [98 %-100 %] 98 % (12/27 0530) Last BM Date: 05/19/13 General: looks well.  comfortable   Heart: slightly tachy, regular Chest: clear.  Voice hoarse.  No cough or SOB Abdomen: soft, not tender, slightly distended.  BS active  Extremities: no pedal edema Neuro/Psych:  Pleasant, relaxed, no confusion.   Intake/Output from previous day: 12/26 0701 - 12/27 0700 In: 3442.5 [P.O.:480; I.V.:455; TPN:2507.5] Out: -   Intake/Output this shift:    Lab Results:  Recent Labs  05/18/13 0425 05/19/13 0500 05/20/13 0542  WBC 18.8* 20.0* 18.4*  HGB 11.0* 10.8* 11.0*  HCT 33.3* 32.3* 32.4*  PLT 197 208 192   BMET  Recent Labs  05/18/13 0425 05/19/13 0500 05/20/13 0520  NA 132* 131* 133*  K 3.8 3.8 3.9  CL 99 97 100  CO2 23 25 23   GLUCOSE 274* 252* 196*  BUN 16 14 15   CREATININE 1.07 0.99 0.97  CALCIUM 7.0* 7.2* 7.6*   LFT  Recent Labs  05/18/13 0425 05/19/13 0500 05/20/13 0520  PROT 5.4* 5.6* 5.9*  ALBUMIN 1.9* 1.9* 1.9*  AST 93* 71* 40*  ALT 63* 64* 53  ALKPHOS 89 95 105  BILITOT 0.9 1.0 0.7    Studies/Results: No results found.  ASSESMENT:   *  Acute biliary pancreatitis.  LFTs improved.  Tolerating clears, and one meal of solids this lunchtime. Had elevated WBCs so plan is for CXR and U/A.  No fever.   *  S/p lap chole with IOC 12/124. cholangiogram  showed excellent flow into the duodenum, stent in place, no intraductal filling defects, and good proximal filling..   *  S/P ERCP 05/10/13 .  No evidence of any CBD stones, but multiple  fragments were draining from the ampulla. Because of the severe  edema, stone fragments, fever, and SVT, a 7 Fr x 5 cm stent was  inserted without difficulty. Dr Elnoria Howard did not feel comfortable with  creating a sphincterotomy as the area was so edematous.Excellent  drainage of contrast, bile and stone fragments were noted.  PLAN   *  Add Protonix for reflux.  *  Agree with tapering TNA, might even stop this     Jennye Moccasin  05/20/2013, 2:30 PM Pager: [phone]  GI Attending Note  I have personally taken an interval history, reviewed the chart, and examined the patient.  I agree with the extender's note, impression and recommendations.  Barbette Hair. Arlyce Dice, MD, [facility] [phone]

## 2013-05-21 ENCOUNTER — Inpatient Hospital Stay (HOSPITAL_COMMUNITY): Payer: BC Managed Care – PPO

## 2013-05-21 ENCOUNTER — Encounter (HOSPITAL_COMMUNITY): Payer: Self-pay | Admitting: General Surgery

## 2013-05-21 DIAGNOSIS — E119 Type 2 diabetes mellitus without complications: Secondary | ICD-10-CM

## 2013-05-21 LAB — TSH: TSH: 0.764 u[IU]/mL (ref 0.350–4.500)

## 2013-05-21 LAB — T3, FREE: T3, Free: 2.2 pg/mL — ABNORMAL LOW (ref 2.3–4.2)

## 2013-05-21 LAB — CBC
Hemoglobin: 10.8 g/dL — ABNORMAL LOW (ref 13.0–17.0)
MCH: 30.9 pg (ref 26.0–34.0)
MCV: 90.6 fL (ref 78.0–100.0)
RBC: 3.5 MIL/uL — ABNORMAL LOW (ref 4.22–5.81)
WBC: 20.6 10*3/uL — ABNORMAL HIGH (ref 4.0–10.5)

## 2013-05-21 LAB — COMPREHENSIVE METABOLIC PANEL
AST: 29 U/L (ref 0–37)
Albumin: 2.1 g/dL — ABNORMAL LOW (ref 3.5–5.2)
BUN: 17 mg/dL (ref 6–23)
Calcium: 7.7 mg/dL — ABNORMAL LOW (ref 8.4–10.5)
Chloride: 97 mEq/L (ref 96–112)
Creatinine, Ser: 1 mg/dL (ref 0.50–1.35)
Total Bilirubin: 1 mg/dL (ref 0.3–1.2)

## 2013-05-21 LAB — T4, FREE: Free T4: 1.64 ng/dL (ref 0.80–1.80)

## 2013-05-21 LAB — GLUCOSE, CAPILLARY
Glucose-Capillary: 204 mg/dL — ABNORMAL HIGH (ref 70–99)
Glucose-Capillary: 179 mg/dL — ABNORMAL HIGH (ref 70–99)

## 2013-05-21 MED ORDER — SODIUM CHLORIDE 0.9 % IV SOLN
50.0000 mg | Freq: Three times a day (TID) | INTRAVENOUS | Status: DC | PRN
  Administered 2013-05-21: 50 mg via INTRAVENOUS
  Filled 2013-05-21 (×3): qty 2

## 2013-05-21 MED ORDER — ZOLPIDEM TARTRATE 5 MG PO TABS
5.0000 mg | ORAL_TABLET | Freq: Every evening | ORAL | Status: DC | PRN
  Administered 2013-05-21: 5 mg via ORAL
  Filled 2013-05-21: qty 1

## 2013-05-21 MED ORDER — METOPROLOL TARTRATE 25 MG PO TABS
25.0000 mg | ORAL_TABLET | Freq: Two times a day (BID) | ORAL | Status: DC
  Administered 2013-05-21 – 2013-05-25 (×8): 25 mg via ORAL
  Filled 2013-05-21 (×9): qty 1

## 2013-05-21 MED ORDER — INSULIN ASPART 100 UNIT/ML ~~LOC~~ SOLN
4.0000 [IU] | Freq: Three times a day (TID) | SUBCUTANEOUS | Status: DC
  Administered 2013-05-22 – 2013-05-25 (×7): 4 [IU] via SUBCUTANEOUS

## 2013-05-21 MED ORDER — INSULIN GLARGINE 100 UNIT/ML ~~LOC~~ SOLN
10.0000 [IU] | Freq: Two times a day (BID) | SUBCUTANEOUS | Status: DC
  Administered 2013-05-21 – 2013-05-25 (×9): 10 [IU] via SUBCUTANEOUS
  Filled 2013-05-21 (×10): qty 0.1

## 2013-05-21 MED ORDER — IOHEXOL 300 MG/ML  SOLN
100.0000 mL | Freq: Once | INTRAMUSCULAR | Status: AC | PRN
  Administered 2013-05-21: 100 mL via INTRAVENOUS

## 2013-05-21 MED ORDER — IOHEXOL 300 MG/ML  SOLN
25.0000 mL | INTRAMUSCULAR | Status: AC
  Administered 2013-05-21 (×2): 25 mL via ORAL

## 2013-05-21 MED ORDER — DOCUSATE SODIUM 100 MG PO CAPS: 100.0000 mg | ORAL_CAPSULE | Freq: Two times a day (BID) | ORAL | Status: DC | PRN

## 2013-05-21 NOTE — Progress Notes (Signed)
Will check CT A/P in light of ongoing leukocytosis. Appreciate IM F/U of DM. Thyroid W/U ongoing. I spoke to huis wife. Patient examined and I agree with the assessment and plan  Violeta Gelinas, MD, MPH, FACS Pager: [phone]  05/21/2013 11:50 AM

## 2013-05-21 NOTE — Consult Note (Signed)
[facility] CONSULT F/U Note [place] TEAM 1 - Stepdown ICU Team   Estevan Kersh NWG:[id number] DOB: 02/12/1966 DOA: 05/09/2013 PCP: [name], MD  Brief narrative: 47 year old patient with known history of diabetes hypertension and dyslipidemia. Presented to the hospital with a three-week history of abdominal pain. This pain worsened 24 hours prior to presentation and was associated with nausea and vomiting. He denied issues with fevers or chills chest pain or shortness of breath prior to presentation. Evaluation in the ER revealed elevated lipase and leukocytosis. Additional diagnostic evaluation revealed choledocholithiasis.   HPI/Subjective: The pt is up ambulating about his room. He c/o difficulty sleeping at night as well as modest epigastric pain.  He denies cp or sob.    Assessment/Plan:  Sinus tachycardia HR overall has been improving - suspect is consequence of ongoing severe pancreatic inflammation - checking thyroid studies is reasonable   DM  CBG poorly controlled and pt now being weaned off TNA - given extent of pancreatic injury will most likely require ongoing use of insulin for near future - adjust tx regimen and follow CBGs  Severe acute gallstone pancreatitis s/p cholecystectomy   Ongoing care as per Gen Surgery   Severe protein calorie malnutrition Pt is being weaned off TNA though understandably has very poor appetite  HTN  Well controlled   Hx of HLD  Holding PO meds  Code Status: Full  Antibiotics: Unasyn 12/16 >>>12/20 Primaxin 12/21 >>>  Objective: Blood pressure 119/75, pulse 100, temperature 97.8 F (36.6 C), temperature source Oral, resp. rate 16, height 5\' 6"  (1.676 m), weight 75.6 kg (166 lb 10.7 oz), SpO2 98.00%.  Intake/Output Summary (Last 24 hours) at 05/21/13 1805 Last data filed at 05/21/13 1500  Gross per 24 hour  Intake   1600 ml  Output      0 ml  Net   1600 ml   Exam: General: No acute respiratory  distress Lungs: Clear to auscultation bilaterally without wheezes or crackles Cardiovascular: Regular rate and rhythm without murmur gallop or rub  Abdomen: minimal distension, bowel sounds +, no rebound, no obvious ascites, no appreciable mass Musculoskeletal: No significant cyanosis, clubbing, edema of bilateral lower extremities  Scheduled Meds:  Scheduled Meds: . enoxaparin (LOVENOX) injection  40 mg Subcutaneous Q24H  . imipenem-cilastatin  500 mg Intravenous Q6H  . insulin aspart  0-15 Units Subcutaneous TID WC  . insulin aspart  0-5 Units Subcutaneous QHS  . insulin glargine  10 Units Subcutaneous BID  . lisinopril  5 mg Oral Daily  . metoprolol  5 mg Intravenous Q6H  . simvastatin  20 mg Oral q1800  . sodium chloride  10-40 mL Intracatheter Q12H   Data Reviewed: Basic Metabolic Panel:  Recent Labs Lab 05/15/13 0443 05/16/13 0455 05/17/13 0540 05/18/13 0425 05/19/13 0500 05/20/13 0520 05/21/13 0500  NA 141 141 135 132* 131* 133* 130*  K 4.3 3.3* 3.5 3.8 3.8 3.9 4.5  CL 101 102 100 99 97 100 97  CO2 30 27 26 23 25 23 23   GLUCOSE 128* 193* 236* 274* 252* 196* 195*  BUN 21 16 14 16 14 15 17   CREATININE 1.06 0.89 0.94 1.07 0.99 0.97 1.00  CALCIUM 8.0* 7.6* 7.7* 7.0* 7.2* 7.6* 7.7*  MG  --  2.1  --  1.9  --   --   --   PHOS  --  2.4  --  2.6  --   --   --    Liver Function Tests:  Recent Labs  Lab 05/17/13 0540 05/18/13 0425 05/19/13 0500 05/20/13 0520 05/21/13 0500  AST 29 93* 71* 40* 29  ALT 30 63* 64* 53 40  ALKPHOS 97 89 95 105 116  BILITOT 1.2 0.9 1.0 0.7 1.0  PROT 5.8* 5.4* 5.6* 5.9* 6.4  ALBUMIN 2.2* 1.9* 1.9* 1.9* 2.1*    Recent Labs Lab 05/15/13 0443  LIPASE 53   CBC:  Recent Labs Lab 05/16/13 0455 05/17/13 0540 05/18/13 0425 05/19/13 0500 05/20/13 0542 05/21/13 0500  WBC 12.0* 13.9* 18.8* 20.0* 18.4* 20.6*  NEUTROABS 9.9*  --   --   --   --   --   HGB 11.2* 11.1* 11.0* 10.8* 11.0* 10.8*  HCT 33.2* 33.8* 33.3* 32.3* 32.4* 31.7*   MCV 92.2 92.1 92.5 91.8 91.8 90.6  PLT 162 189 197 208 192 319    CBG:  Recent Labs Lab 05/20/13 1652 05/20/13 2219 05/21/13 0748 05/21/13 1156 05/21/13 1658  GLUCAP 169* 170* 204* 121* 179*    No results found for this or any previous visit (from the past 240 hour(s)).   Studies:  Recent x-ray studies have been reviewed in detail by the Attending Physician  Lonia Blood, MD [facility]  [phone] Pager [phone]  On-Call/Text Page:      [email or web address]      password [facility]  05/21/2013, 6:05 PM

## 2013-05-21 NOTE — Consult Note (Addendum)
[facility] [place] TEAM 1 - Stepdown/ICU TEAM  Have been asked to re-visit for consultation to assist in addressing uncontrolled DM and persitent tachycardia.  Meds adjusted.  Pt to be examined later today during rounds.  We will be happy to continue to follow along with you.  Lonia Blood, MD [facility]  [phone] Pager [phone]  On-Call/Text Page:      [email or web address]      password [facility]

## 2013-05-21 NOTE — Progress Notes (Signed)
4 Days Post-Op  Subjective: Pt feels much better today.  Minimal abdominal pain.  No n/v.  Tolerating low fat diet.  Ambulating well.  Urinating well, no BM since 12/26.  After much investigation the patient has a family history of thyroid problems.  His father has low thyroid.  He says his mother had her thyroid out and his brother has Graves disease and had radioactive iodine treatments and now is on synthroid.  He notes he has had a workup by Dr. Alvie Heidelberg in [facility] including a biopsy which was supposedly negative.  He was told his thyroid functions properly and no further recommendations were given.    Objective: Vital signs in last 24 hours: Temp:  [97.9 F (36.6 C)-98.8 F (37.1 C)] 98.5 F (36.9 C) (12/28 0615) Pulse Rate:  [98-106] 102 (12/28 0615) Resp:  [17-19] 18 (12/28 0615) BP: (101-145)/(51-68) 138/68 mmHg (12/28 0615) SpO2:  [97 %-98 %] 98 % (12/28 0615) Last BM Date: 05/19/13  Intake/Output from previous day: 12/27 0701 - 12/28 0700 In: 1915.1 [P.O.:240; I.V.:1043.7; IV Piggyback:300; TPN:331.4] Out: 650 [Urine:650] Intake/Output this shift:    PE: Gen:  Alert, NAD, pleasant Neck:  Goiter noted Pulm:  CTA B/L, no W/R/R, good effort Card: regular rhythm, tachycardic, no M/G/R, nl S1/S2 Abd: Soft, NT/ND, +BS, no HSM, incisions C/D/I   Lab Results:   Recent Labs  05/20/13 0542 05/21/13 0500  WBC 18.4* 20.6*  HGB 11.0* 10.8*  HCT 32.4* 31.7*  PLT 192 319   BMET  Recent Labs  05/20/13 0520 05/21/13 0500  NA 133* 130*  K 3.9 4.5  CL 100 97  CO2 23 23  GLUCOSE 196* 195*  BUN 15 17  CREATININE 0.97 1.00  CALCIUM 7.6* 7.7*   PT/INR No results found for this basename: LABPROT, INR,  in the last 72 hours CMP     Component Value Date/Time   NA 130* 05/21/2013 0500   K 4.5 05/21/2013 0500   CL 97 05/21/2013 0500   CO2 23 05/21/2013 0500   GLUCOSE 195* 05/21/2013 0500   BUN 17 05/21/2013 0500   CREATININE 1.00 05/21/2013 0500   CALCIUM  7.7* 05/21/2013 0500   PROT 6.4 05/21/2013 0500   ALBUMIN 2.1* 05/21/2013 0500   AST 29 05/21/2013 0500   ALT 40 05/21/2013 0500   ALKPHOS 116 05/21/2013 0500   BILITOT 1.0 05/21/2013 0500   GFRNONAA 89* 05/21/2013 0500   GFRAA >90 05/21/2013 0500   Lipase     Component Value Date/Time   LIPASE 53 05/15/2013 0443       Studies/Results: Dg Chest 2 View  05/20/2013   CLINICAL DATA:  Leukocytosis  EXAM: CHEST  2 VIEW  COMPARISON:  05/12/2013  FINDINGS: Normal heart size. Low lung volumes. Bibasilar atelectasis improved. Right PICC placed. Tip is at the cavoatrial junction. No pneumothorax.  IMPRESSION: Improved bibasilar atelectasis.   Electronically Signed   By: Maryclare Bean M.D.   On: 05/20/2013 15:42    Anti-infectives: Anti-infectives   Start     Dose/Rate Route Frequency Ordered Stop   05/20/13 1200  imipenem-cilastatin (PRIMAXIN) 500 mg in sodium chloride 0.9 % 100 mL IVPB     500 mg 200 mL/hr over 30 Minutes Intravenous 4 times per day 05/20/13 1128     05/16/13 0200  imipenem-cilastatin (PRIMAXIN) 500 mg in sodium chloride 0.9 % 100 mL IVPB  Status:  Discontinued     500 mg 200 mL/hr over 30 Minutes Intravenous Every 6 hours  05/15/13 2045 05/19/13 0836   05/14/13 1330  imipenem-cilastatin (PRIMAXIN) 500 mg in sodium chloride 0.9 % 100 mL IVPB  Status:  Discontinued     500 mg 200 mL/hr over 30 Minutes Intravenous Every 6 hours 05/14/13 1247 05/15/13 2045   05/09/13 2200  Ampicillin-Sulbactam (UNASYN) 3 g in sodium chloride 0.9 % 100 mL IVPB  Status:  Discontinued     3 g 100 mL/hr over 60 Minutes Intravenous Every 6 hours 05/09/13 1733 05/13/13 1821   05/09/13 1630  Ampicillin-Sulbactam (UNASYN) 3 g in sodium chloride 0.9 % 100 mL IVPB     3 g 100 mL/hr over 60 Minutes Intravenous  Once 05/09/13 1605 05/09/13 1721       Assessment/Plan POD #4 s/p lap chole with IOC for biliary pancreatitis  Post-operative ileus - resolving  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 On 6N  2. Tolerating soft diet 3. Ambulate and IS  4. SCD's and Lovenox  5. Afebrile 6. Recheck CBC is up again to 20.6, continue primaxin  7. Wean TNA to off, resume all home meds  8. Urinalysis and CXR are normal, his LFT's are normal, his WBC continues to stay between 18-20 for the last 4 days. He is tachycardic despite not bein in much pain.  After much investigation he is at risk for perioperative thyroid complications given his personal and family history.  It's rare to have post-op complications of thyroid disease when he had a normal workup. Will order TSH, T3 free and T4 free.  Will get CT scan to make sure his WBC isn't coming from his abdomen as well, but I have a feeling that its likely toxicosis of some sort. 9.  Sugars are also elevated, but just restarted home meds.  Call back Dr. Sharon Seller to re-evaluate the patient given the elevated sugars and possible thyroid condition. 10.  D/C picc line since not needing TPN - site does not look infected and there is no tenderness    LOS: 12 days    DORT, Nella Botsford 05/21/2013, 8:56 AM Pager: [phone]

## 2013-05-21 NOTE — Progress Notes (Signed)
ANTIBIOTIC CONSULT NOTE - FOLLOW UP  Pharmacy Consult for imipenem Indication: pancreatitis  No Known Allergies  Patient Measurements: Height: 5\' 6"  (167.6 cm) Weight: 166 lb 10.7 oz (75.6 kg) IBW/kg (Calculated) : 63.8   Vital Signs: Temp: 98.5 F (36.9 C) (12/28 0615) Temp src: Oral (12/27 2222) BP: 138/68 mmHg (12/28 0615) Pulse Rate: 102 (12/28 0615) Intake/Output from previous day: 12/27 0701 - 12/28 0700 In: 1915.1 [P.O.:240; I.V.:1043.7; IV Piggyback:300; TPN:331.4] Out: 650 [Urine:650] Intake/Output from this shift:    Labs:  Recent Labs  05/19/13 0500 05/20/13 0520 05/20/13 0542 05/21/13 0500  WBC 20.0*  --  18.4* 20.6*  HGB 10.8*  --  11.0* 10.8*  PLT 208  --  192 319  CREATININE 0.99 0.97  --  1.00   Estimated Creatinine Clearance: 84.2 ml/min (by C-G formula based on Cr of 1). No results found for this basename: VANCOTROUGH, Leodis Binet, VANCORANDOM, GENTTROUGH, GENTPEAK, GENTRANDOM, TOBRATROUGH, TOBRAPEAK, TOBRARND, AMIKACINPEAK, AMIKACINTROU, AMIKACIN,  in the last 72 hours   Microbiology: Recent Results (from the past 720 hour(s))  URINE CULTURE     Status: None   Collection Time    05/09/13  1:47 PM      Result Value Range Status   Specimen Description URINE, CLEAN CATCH   Final   Special Requests Normal   Final   Culture  Setup Time     Final   Value: 05/09/2013 22:24     Performed at [facility] Count     Final   Value: NO GROWTH     Performed at [facility]   Culture     Final   Value: NO GROWTH     Performed at [facility]   Report Status 05/10/2013 FINAL   Final  CULTURE, BLOOD (ROUTINE X 2)     Status: None   Collection Time    05/11/13  9:35 AM      Result Value Range Status   Specimen Description BLOOD RIGHT ANTECUBITAL   Final   Special Requests     Final   Value: BOTTLES DRAWN AEROBIC AND ANAEROBIC 10CC AER 5CC ANA   Culture  Setup Time     Final   Value: 05/11/2013 14:59     Performed at  [facility]   Culture     Final   Value: NO GROWTH 5 DAYS     Performed at [facility]   Report Status 05/17/2013 FINAL   Final  CULTURE, BLOOD (ROUTINE X 2)     Status: None   Collection Time    05/11/13  9:50 AM      Result Value Range Status   Specimen Description BLOOD WRIST RIGHT   Final   Special Requests BOTTLES DRAWN AEROBIC AND ANAEROBIC 10CC   Final   Culture  Setup Time     Final   Value: 05/11/2013 14:59     Performed at [facility]   Culture     Final   Value: NO GROWTH 5 DAYS     Performed at [facility]   Report Status 05/17/2013 FINAL   Final    Anti-infectives   Start     Dose/Rate Route Frequency Ordered Stop   05/20/13 1200  imipenem-cilastatin (PRIMAXIN) 500 mg in sodium chloride 0.9 % 100 mL IVPB     500 mg 200 mL/hr over 30 Minutes Intravenous 4 times per day 05/20/13 1128     05/16/13 0200  imipenem-cilastatin (PRIMAXIN) 500 mg in sodium chloride 0.9 % 100 mL IVPB  Status:  Discontinued     500 mg 200 mL/hr over 30 Minutes Intravenous Every 6 hours 05/15/13 2045 05/19/13 0836   05/14/13 1330  imipenem-cilastatin (PRIMAXIN) 500 mg in sodium chloride 0.9 % 100 mL IVPB  Status:  Discontinued     500 mg 200 mL/hr over 30 Minutes Intravenous Every 6 hours 05/14/13 1247 05/15/13 2045   05/09/13 2200  Ampicillin-Sulbactam (UNASYN) 3 g in sodium chloride 0.9 % 100 mL IVPB  Status:  Discontinued     3 g 100 mL/hr over 60 Minutes Intravenous Every 6 hours 05/09/13 1733 05/13/13 1821   05/09/13 1630  Ampicillin-Sulbactam (UNASYN) 3 g in sodium chloride 0.9 % 100 mL IVPB     3 g 100 mL/hr over 60 Minutes Intravenous  Once 05/09/13 1605 05/09/13 1721      Assessment: 47 yo man POD # 3 s/p lap chole for biliary pancreatitis.  WBC still elevated at 20.6.  12/27 CXR: improved bibasilar atelectasis.  AF. Unasyn 12/16>>12/27 Imipenem 12/27>> 12/18 BC x 2 NGF 12/28 Ucs NGF  Plan:  1. Imipenem 500 mg IV q6h 2. F/u  plans. Herby Abraham, Pharm.D. [phone] 05/21/2013 9:58 AM

## 2013-05-22 ENCOUNTER — Encounter (HOSPITAL_COMMUNITY): Payer: Self-pay | Admitting: General Surgery

## 2013-05-22 DIAGNOSIS — E049 Nontoxic goiter, unspecified: Secondary | ICD-10-CM | POA: Diagnosis present

## 2013-05-22 DIAGNOSIS — D72829 Elevated white blood cell count, unspecified: Secondary | ICD-10-CM

## 2013-05-22 LAB — GLUCOSE, CAPILLARY
Glucose-Capillary: 123 mg/dL — ABNORMAL HIGH (ref 70–99)
Glucose-Capillary: 115 mg/dL — ABNORMAL HIGH (ref 70–99)

## 2013-05-22 LAB — CBC
HCT: 33.6 % — ABNORMAL LOW (ref 39.0–52.0)
MCH: 30.5 pg (ref 26.0–34.0)
MCHC: 33.6 g/dL (ref 30.0–36.0)
MCV: 90.8 fL (ref 78.0–100.0)
Platelets: 388 10*3/uL (ref 150–400)
RDW: 13.3 % (ref 11.5–15.5)
WBC: 23.8 10*3/uL — ABNORMAL HIGH (ref 4.0–10.5)

## 2013-05-22 LAB — COMPREHENSIVE METABOLIC PANEL
Alkaline Phosphatase: 115 U/L (ref 39–117)
BUN: 18 mg/dL (ref 6–23)
CO2: 25 mEq/L (ref 19–32)
Chloride: 98 mEq/L (ref 96–112)
GFR calc Af Amer: 88 mL/min — ABNORMAL LOW (ref >90)
GFR calc non Af Amer: 76 mL/min — ABNORMAL LOW (ref >90)
Glucose, Bld: 157 mg/dL — ABNORMAL HIGH (ref 70–99)
Potassium: 4.8 mEq/L (ref 3.5–5.1)
Total Bilirubin: 1 mg/dL (ref 0.3–1.2)
Total Protein: 6.5 g/dL (ref 6.0–8.3)

## 2013-05-22 MED ORDER — PANTOPRAZOLE SODIUM 40 MG PO TBEC
40.0000 mg | DELAYED_RELEASE_TABLET | Freq: Two times a day (BID) | ORAL | Status: DC
  Administered 2013-05-22 – 2013-05-25 (×7): 40 mg via ORAL
  Filled 2013-05-22 (×7): qty 1

## 2013-05-22 NOTE — Progress Notes (Signed)
5 Days Post-Op  Subjective: The patient has been wanting to go home for days.  He's understanding of our conservative approach though.  He notes only mild abdominal pain in the epigastrium to palpation otherwise it doesn't hurt.  No N/V.  Tolerating low fat diet.  Wants rice poudrage.  Ambulating well.  Having BM's and urinating well.       Objective: Vital signs in last 24 hours: Temp:  [97.8 F (36.6 C)-99.5 F (37.5 C)] 98.8 F (37.1 C) (12/29 0537) Pulse Rate:  [100-111] 111 (12/29 0537) Resp:  [16] 16 (12/29 0537) BP: (119-134)/(71-75) 134/72 mmHg (12/29 0537) SpO2:  [98 %] 98 % (12/29 0537) Last BM Date: 05/21/13  Intake/Output from previous day: 12/28 0701 - 12/29 0700 In: 1718.7 [P.O.:720; I.V.:473.7; IV Piggyback:525] Out: -  Intake/Output this shift:    PE: Gen:  Alert, NAD, pleasant Abd: Soft, mild tenderness in the epigastrium, ND, +BS, no HSM, incisions C/D/I   Lab Results:   Recent Labs  05/20/13 0542 05/21/13 0500  WBC 18.4* 20.6*  HGB 11.0* 10.8*  HCT 32.4* 31.7*  PLT 192 319   BMET  Recent Labs  05/21/13 0500 05/22/13 0328  NA 130* 133*  K 4.5 4.8  CL 97 98  CO2 23 25  GLUCOSE 195* 157*  BUN 17 18  CREATININE 1.00 1.14  CALCIUM 7.7* 7.7*   PT/INR No results found for this basename: LABPROT, INR,  in the last 72 hours CMP     Component Value Date/Time   NA 133* 05/22/2013 0328   K 4.8 05/22/2013 0328   CL 98 05/22/2013 0328   CO2 25 05/22/2013 0328   GLUCOSE 157* 05/22/2013 0328   BUN 18 05/22/2013 0328   CREATININE 1.14 05/22/2013 0328   CALCIUM 7.7* 05/22/2013 0328   PROT 6.5 05/22/2013 0328   ALBUMIN 2.2* 05/22/2013 0328   AST 26 05/22/2013 0328   ALT 32 05/22/2013 0328   ALKPHOS 115 05/22/2013 0328   BILITOT 1.0 05/22/2013 0328   GFRNONAA 76* 05/22/2013 0328   GFRAA 88* 05/22/2013 0328   Lipase     Component Value Date/Time   LIPASE 53 05/15/2013 0443       Studies/Results: Dg Chest 2 View  05/20/2013    CLINICAL DATA:  Leukocytosis  EXAM: CHEST  2 VIEW  COMPARISON:  05/12/2013  FINDINGS: Normal heart size. Low lung volumes. Bibasilar atelectasis improved. Right PICC placed. Tip is at the cavoatrial junction. No pneumothorax.  IMPRESSION: Improved bibasilar atelectasis.   Electronically Signed   By: Maryclare Bean M.D.   On: 05/20/2013 15:42   Ct Abdomen Pelvis W Contrast  05/21/2013   CLINICAL DATA:  Pancreatitis, cholelithiasis, choledocholithiasis, increasing pain, post cholecystectomy with leukocytosis question intra-abdominal infection, history hypertension, diabetes  EXAM: CT ABDOMEN AND PELVIS WITH CONTRAST  TECHNIQUE: Multidetector CT imaging of the abdomen and pelvis was performed using the standard protocol following bolus administration of intravenous contrast. Sagittal and coronal MPR images reconstructed from axial data set.  CONTRAST:  OMNIPAQUE IOHEXOL 300 MG/ML  SOLN  COMPARISON:  None.  FINDINGS: Bibasilar atelectasis.  Gallbladder surgically absent.  Pancreas is markedly edematous with extensive peripancreatic edema in the anterior pararenal space compatible with acute pancreatitis.  Multiple areas of low attenuation within pain correct right arm are identified which could be related to more prominent edema or areas of necrosis.  No definite pancreatic hemorrhage seen.  Pancreatic ductal stent identified.  None of the observed peripancreatic fluid demonstrates significant mass  effect to suggest discrete pseudocyst.  Liver, spleen, kidneys, and adrenal glands normal appearance.  Portal, superior mesenteric and splenic veins appear patent.  Small triangular fluid collection in the pelvis question ascites.  Stomach and bowel loops normal appearance.  No mass, adenopathy, free air or hernia.  Unremarkable bladder in ureters.  No acute osseous findings.  IMPRESSION: Persistent severe changes of acute pancreatitis with extensive peripancreatic edema and fluid throughout the anterior para renal  space.  Portions of the pancreatic body/ tail are hypo attenuating, cannot exclude areas of pancreatic necrosis.  No discrete/mature pseudocyst is yet identified.  No evidence of venous occlusion.   Electronically Signed   By: Ulyses Southward M.D.   On: 05/21/2013 14:14    Anti-infectives: Anti-infectives   Start     Dose/Rate Route Frequency Ordered Stop   05/20/13 1200  imipenem-cilastatin (PRIMAXIN) 500 mg in sodium chloride 0.9 % 100 mL IVPB     500 mg 200 mL/hr over 30 Minutes Intravenous 4 times per day 05/20/13 1128     05/16/13 0200  imipenem-cilastatin (PRIMAXIN) 500 mg in sodium chloride 0.9 % 100 mL IVPB  Status:  Discontinued     500 mg 200 mL/hr over 30 Minutes Intravenous Every 6 hours 05/15/13 2045 05/19/13 0836   05/14/13 1330  imipenem-cilastatin (PRIMAXIN) 500 mg in sodium chloride 0.9 % 100 mL IVPB  Status:  Discontinued     500 mg 200 mL/hr over 30 Minutes Intravenous Every 6 hours 05/14/13 1247 05/15/13 2045   05/09/13 2200  Ampicillin-Sulbactam (UNASYN) 3 g in sodium chloride 0.9 % 100 mL IVPB  Status:  Discontinued     3 g 100 mL/hr over 60 Minutes Intravenous Every 6 hours 05/09/13 1733 05/13/13 1821   05/09/13 1630  Ampicillin-Sulbactam (UNASYN) 3 g in sodium chloride 0.9 % 100 mL IVPB     3 g 100 mL/hr over 60 Minutes Intravenous  Once 05/09/13 1605 05/09/13 1721       Assessment/Plan POD #5 s/p lap chole with IOC for biliary pancreatitis  Post-operative ileus - resolving  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 On 6N  2. Tolerating soft diet  3. Ambulate and IS  4. SCD's and Lovenox  5. Afebrile  6. WBC count likely from pancreatitis, CBC is now 23.8 & lipase pending, continue primaxin  7. TNA d/c'ed 8. CT scan shows evidence of severe pancreatitis, but no abscess, possible pancreatic necrosis 9. Dr. Sharon Seller following DM, tyroid, tachycardia, Dr. Elnoria Howard following  for pancreatitis 10. Picc line discontinued does not look infected and there is no tenderness 11.  Question whether we need to back him off to NPO to allow pancrease to heal despite him tolerating the food, PANDA?    LOS: 13 days    DORT, Aundra Millet 05/22/2013, 7:53 AM Pager: [phone]

## 2013-05-22 NOTE — Progress Notes (Signed)
Patient seems significantly improved with less abdominal pain, but WBC continues to increase No apparent ileus LFT's normal Lipase normal.  Continue IV abx. Will advance diet when WBC decreases.  Wilmon Arms. Corliss Skains, MD, [facility]  General/ Trauma Surgery  05/22/2013 1:13 PM

## 2013-05-22 NOTE — Progress Notes (Signed)
[facility] PROGRESS NOTE  Nicolas Chandler RUE:[id number] DOB: [birth year] DOA: 05/09/2013 PCP: [name], MD Brief narrative:  47 year old patient with known history of diabetes hypertension and dyslipidemia. Presented to the hospital with a three-week history of abdominal pain. This pain worsened 24 hours prior to presentation and was associated with nausea and vomiting. He denied issues with fevers or chills chest pain or shortness of breath prior to presentation. Evaluation in the ER revealed elevated lipase and leukocytosis. Additional diagnostic evaluation revealed choledocholithiasis.    Assessment/Plan: 1. Severe acute gallstone pancreatitis - GI on board and currently managing will defer further recommendations to them.  2. patient is status post day 5 laparoscopic cholecystectomy - Defer further recommendations Gen. Surgery  3. Diabetes mellitus type 2 - Blood sugars relatively well controlled on current regimen - Once able to advance diet recommend diabetic low-fat diet unless otherwise indicated by gastroenterology - We'll plan on continuing current hypoglycemic regimen with Lantus and sliding scale insulin  4. tachycardia -Most likely related to #1. - Resolving with last value at 96  5. family history of hyper-or hypothyroidism - Thyroid studies reviewed and TSH within normal limits. Mild decrease in T3 levels - Would recommend repeating thyroid studies within the next 4-6 weeks.   Code Status: Full Family Communication: Discussed with patient and spouse at bedside Disposition Plan: Pending continued improvement in condition.   Consultants:  Internal medicine: [facility]  -Gastroenterologist  Procedures:  Laparoscopic cholecystectomy as mentioned above.  Antibiotics:  Currently on Primaxin  HPI/Subjective: Patient has no new complaints at this juncture.  Objective: Filed Vitals:   05/22/13 1311  BP: 105/72  Pulse: 96  Temp:  98.1 F (36.7 C)  Resp: 18    Intake/Output Summary (Last 24 hours) at 05/22/13 1733 Last data filed at 05/22/13 1401  Gross per 24 hour  Intake 718.67 ml  Output      0 ml  Net 718.67 ml   Filed Weights   05/10/13 1525 05/12/13 0415 05/17/13 0433  Weight: 73 kg (160 lb 15 oz) 75.6 kg (166 lb 10.7 oz) 75.6 kg (166 lb 10.7 oz)    Exam:   General: Patient in no acute distress, alert, awake  Cardiovascular: Pink extremities  Respiratory: No increased work of breathing, no other wheezes  Abdomen: Nondistended, soft  Musculoskeletal: no cyanosis or clubbing   Data Reviewed: Basic Metabolic Panel:  Recent Labs Lab 05/16/13 0455  05/18/13 0425 05/19/13 0500 05/20/13 0520 05/21/13 0500 05/22/13 0328  NA 141  < > 132* 131* 133* 130* 133*  K 3.3*  < > 3.8 3.8 3.9 4.5 4.8  CL 102  < > 99 97 100 97 98  CO2 27  < > 23 25 23 23 25   GLUCOSE 193*  < > 274* 252* 196* 195* 157*  BUN 16  < > 16 14 15 17 18   CREATININE 0.89  < > 1.07 0.99 0.97 1.00 1.14  CALCIUM 7.6*  < > 7.0* 7.2* 7.6* 7.7* 7.7*  MG 2.1  --  1.9  --   --   --   --   PHOS 2.4  --  2.6  --   --   --   --   < > = values in this interval not displayed. Liver Function Tests:  Recent Labs Lab 05/18/13 0425 05/19/13 0500 05/20/13 0520 05/21/13 0500 05/22/13 0328  AST 93* 71* 40* 29 26  ALT 63* 64* 53 40 32  ALKPHOS 89 95 105 116 115  BILITOT 0.9 1.0 0.7 1.0 1.0  PROT 5.4* 5.6* 5.9* 6.4 6.5  ALBUMIN 1.9* 1.9* 1.9* 2.1* 2.2*    Recent Labs Lab 05/22/13 0940  LIPASE 86*   No results found for this basename: AMMONIA,  in the last 168 hours CBC:  Recent Labs Lab 05/16/13 0455  05/18/13 0425 05/19/13 0500 05/20/13 0542 05/21/13 0500 05/22/13 0940  WBC 12.0*  < > 18.8* 20.0* 18.4* 20.6* 23.8*  NEUTROABS 9.9*  --   --   --   --   --   --   HGB 11.2*  < > 11.0* 10.8* 11.0* 10.8* 11.3*  HCT 33.2*  < > 33.3* 32.3* 32.4* 31.7* 33.6*  MCV 92.2  < > 92.5 91.8 91.8 90.6 90.8  PLT 162  < > 197 208 192  319 388  < > = values in this interval not displayed. Cardiac Enzymes: No results found for this basename: CKTOTAL, CKMB, CKMBINDEX, TROPONINI,  in the last 168 hours BNP (last 3 results) No results found for this basename: PROBNP,  in the last 8760 hours CBG:  Recent Labs Lab 05/21/13 1658 05/21/13 2141 05/22/13 0740 05/22/13 1137 05/22/13 1645  GLUCAP 179* 179* 161* 181* 123*    No results found for this or any previous visit (from the past 240 hour(s)).   Studies: Ct Abdomen Pelvis W Contrast  05/21/2013   CLINICAL DATA:  Pancreatitis, cholelithiasis, choledocholithiasis, increasing pain, post cholecystectomy with leukocytosis question intra-abdominal infection, history hypertension, diabetes  EXAM: CT ABDOMEN AND PELVIS WITH CONTRAST  TECHNIQUE: Multidetector CT imaging of the abdomen and pelvis was performed using the standard protocol following bolus administration of intravenous contrast. Sagittal and coronal MPR images reconstructed from axial data set.  CONTRAST:  OMNIPAQUE IOHEXOL 300 MG/ML  SOLN  COMPARISON:  None.  FINDINGS: Bibasilar atelectasis.  Gallbladder surgically absent.  Pancreas is markedly edematous with extensive peripancreatic edema in the anterior pararenal space compatible with acute pancreatitis.  Multiple areas of low attenuation within pain correct right arm are identified which could be related to more prominent edema or areas of necrosis.  No definite pancreatic hemorrhage seen.  Pancreatic ductal stent identified.  None of the observed peripancreatic fluid demonstrates significant mass effect to suggest discrete pseudocyst.  Liver, spleen, kidneys, and adrenal glands normal appearance.  Portal, superior mesenteric and splenic veins appear patent.  Small triangular fluid collection in the pelvis question ascites.  Stomach and bowel loops normal appearance.  No mass, adenopathy, free air or hernia.  Unremarkable bladder in ureters.  No acute osseous  findings.  IMPRESSION: Persistent severe changes of acute pancreatitis with extensive peripancreatic edema and fluid throughout the anterior para renal space.  Portions of the pancreatic body/ tail are hypo attenuating, cannot exclude areas of pancreatic necrosis.  No discrete/mature pseudocyst is yet identified.  No evidence of venous occlusion.   Electronically Signed   By: Ulyses Southward M.D.   On: 05/21/2013 14:14    Scheduled Meds: . enoxaparin (LOVENOX) injection  40 mg Subcutaneous Q24H  . imipenem-cilastatin  500 mg Intravenous Q6H  . insulin aspart  0-15 Units Subcutaneous TID WC  . insulin aspart  0-5 Units Subcutaneous QHS  . insulin aspart  4 Units Subcutaneous TID WC  . insulin glargine  10 Units Subcutaneous BID  . lisinopril  5 mg Oral Daily  . metoprolol tartrate  25 mg Oral BID  . pantoprazole  40 mg Oral BID AC   Continuous Infusions: . sodium chloride 20 mL/hr  at 05/22/13 0509    Active Problems:   Choledocholithiasis   Abdominal pain   Leukocytosis   Hypokalemia   AKI (acute kidney injury)   DM (diabetes mellitus)   HTN (hypertension)   HLD (hyperlipidemia)   Pancreatitis   Elevated LFTs   Hepatic steatosis   Renal cyst   Thrombocytopenia, unspecified   Protein-calorie malnutrition, severe    Time spent: > 35 minutes    Penny Pia  [facility] Pager [phone] If 7PM-7AM, please contact night-coverage at [email or web address], password [facility] 05/22/2013, 5:33 PM  LOS: 13 days

## 2013-05-22 NOTE — Progress Notes (Signed)
Subjective: Feeling well.  No complaints at this time.  Objective: Vital signs in last 24 hours: Temp:  [97.8 F (36.6 C)-99.5 F (37.5 C)] 98.8 F (37.1 C) (12/29 0537) Pulse Rate:  [100-111] 111 (12/29 0537) Resp:  [16] 16 (12/29 0537) BP: (119-134)/(71-75) 134/72 mmHg (12/29 0537) SpO2:  [98 %] 98 % (12/29 0537) Last BM Date: 05/21/13  Intake/Output from previous day: 12/28 0701 - 12/29 0700 In: 1718.7 [P.O.:720; I.V.:473.7; IV Piggyback:525] Out: -  Intake/Output this shift:    General appearance: alert and no distress GI: soft, non-tender; bowel sounds normal; no masses,  no organomegaly  Lab Results:  Recent Labs  05/20/13 0542 05/21/13 0500  WBC 18.4* 20.6*  HGB 11.0* 10.8*  HCT 32.4* 31.7*  PLT 192 319   BMET  Recent Labs  05/20/13 0520 05/21/13 0500 05/22/13 0328  NA 133* 130* 133*  K 3.9 4.5 4.8  CL 100 97 98  CO2 23 23 25   GLUCOSE 196* 195* 157*  BUN 15 17 18   CREATININE 0.97 1.00 1.14  CALCIUM 7.6* 7.7* 7.7*   LFT  Recent Labs  05/22/13 0328  PROT 6.5  ALBUMIN 2.2*  AST 26  ALT 32  ALKPHOS 115  BILITOT 1.0   PT/INR No results found for this basename: LABPROT, INR,  in the last 72 hours Hepatitis Panel No results found for this basename: HEPBSAG, HCVAB, HEPAIGM, HEPBIGM,  in the last 72 hours C-Diff No results found for this basename: CDIFFTOX,  in the last 72 hours Fecal Lactopherrin No results found for this basename: FECLLACTOFRN,  in the last 72 hours  Studies/Results: Dg Chest 2 View  05/20/2013   CLINICAL DATA:  Leukocytosis  EXAM: CHEST  2 VIEW  COMPARISON:  05/12/2013  FINDINGS: Normal heart size. Low lung volumes. Bibasilar atelectasis improved. Right PICC placed. Tip is at the cavoatrial junction. No pneumothorax.  IMPRESSION: Improved bibasilar atelectasis.   Electronically Signed   By: Maryclare Bean M.D.   On: 05/20/2013 15:42   Ct Abdomen Pelvis W Contrast  05/21/2013   CLINICAL DATA:  Pancreatitis, cholelithiasis,  choledocholithiasis, increasing pain, post cholecystectomy with leukocytosis question intra-abdominal infection, history hypertension, diabetes  EXAM: CT ABDOMEN AND PELVIS WITH CONTRAST  TECHNIQUE: Multidetector CT imaging of the abdomen and pelvis was performed using the standard protocol following bolus administration of intravenous contrast. Sagittal and coronal MPR images reconstructed from axial data set.  CONTRAST:  OMNIPAQUE IOHEXOL 300 MG/ML  SOLN  COMPARISON:  None.  FINDINGS: Bibasilar atelectasis.  Gallbladder surgically absent.  Pancreas is markedly edematous with extensive peripancreatic edema in the anterior pararenal space compatible with acute pancreatitis.  Multiple areas of low attenuation within pain correct right arm are identified which could be related to more prominent edema or areas of necrosis.  No definite pancreatic hemorrhage seen.  Pancreatic ductal stent identified.  None of the observed peripancreatic fluid demonstrates significant mass effect to suggest discrete pseudocyst.  Liver, spleen, kidneys, and adrenal glands normal appearance.  Portal, superior mesenteric and splenic veins appear patent.  Small triangular fluid collection in the pelvis question ascites.  Stomach and bowel loops normal appearance.  No mass, adenopathy, free air or hernia.  Unremarkable bladder in ureters.  No acute osseous findings.  IMPRESSION: Persistent severe changes of acute pancreatitis with extensive peripancreatic edema and fluid throughout the anterior para renal space.  Portions of the pancreatic body/ tail are hypo attenuating, cannot exclude areas of pancreatic necrosis.  No discrete/mature pseudocyst is yet identified.  No evidence of venous occlusion.   Electronically Signed   By: Ulyses Southward M.D.   On: 05/21/2013 14:14    Medications:  Scheduled: . enoxaparin (LOVENOX) injection  40 mg Subcutaneous Q24H  . imipenem-cilastatin  500 mg Intravenous Q6H  . insulin aspart  0-15 Units  Subcutaneous TID WC  . insulin aspart  0-5 Units Subcutaneous QHS  . insulin aspart  4 Units Subcutaneous TID WC  . insulin glargine  10 Units Subcutaneous BID  . lisinopril  5 mg Oral Daily  . metoprolol tartrate  25 mg Oral BID   Continuous: . sodium chloride 20 mL/hr at 05/22/13 1610    Assessment/Plan: 1) Severe acute gallstone pancreatitis. 2) Elevated WBC.   He is well clinically, but the source of his leukocytosis is not clear.  AM labs still pending.  His recent CT scan was negative for any abscess.  Again there was evidence of pancreatic necrosis.  Plan: 1) Continue with IV antibiotics. 2) Follow WBC.  Hopefully it will be declining.  LOS: 13 days   Berda Shelvin D 05/22/2013, 8:39 AM

## 2013-05-22 NOTE — Progress Notes (Signed)
NUTRITION FOLLOW UP  Intervention:   1.  Modify diet; resume PO diet once medically appropriate per MD discretion. 2.  Supplements; consider supplements based on medical course.  RD notes pt's diet is being downgraded despite tolerance so will hold off on supplements for now.   Nutrition Dx:   Inadequate oral intake, ongoing  Monitor:   1.  Parenteral nutrition; initiation with tolerance.  Management per PharmD. 2.  Wt/wt change; monitor trends  Assessment:   Patient with PMH of DM and HTN admitted for choledocholithiasis, cholelithiasis and pancreatitis; symptoms started 3 weeks ago and he thought it was as a result of GERD, but it was unresponsive to Zantac; CT showed possible necrosis but surgery is delayed until his gallstone pancreatitis improves.  Patient s/p procedure 12/17:  ERCP WITH STENT PLACEMENT  POD #5 s/p lap chole with IOC for biliary pancreatitis.   TPN has been discontinued. Diet had been advanced to Regular, however MD has downgraded diet to clear liquids.  Per MD note pt clinically looking better, however continues with increased WBCs. Pt with ongoing severe gallstone pancreatitis with evidence of pancreatic necrosis.   If pt continues with liquid diet, will consider supplements to support intake.  Pt states that his appetite is gone and his throat remains very scratchy.  He only wants rice porridge at this time.   Lipase     Component Value Date/Time   LIPASE 86* 05/22/2013 0940   Lipid Panel     Component Value Date/Time   TRIG 157* 05/16/2013 0435    Patient meets criteria for severe malnutrition in the context of acute illness as evidenced by < 50% intake of estimated energy requirement for > 5 days and 8% weight loss x 1 week.   Height: Ht Readings from Last 1 Encounters:  05/09/13 5\' 6"  (1.676 m)    Weight Status:   Wt Readings from Last 1 Encounters:  05/17/13 166 lb 10.7 oz (75.6 kg)   Re-estimated needs:  Kcal: 2110-2420 Protein:  100-115g Fluid: >2.1 L/day  Skin: non-pitting edema  Diet Order: Fat Restricted   Intake/Output Summary (Last 24 hours) at 05/22/13 1058 Last data filed at 05/22/13 0541  Gross per 24 hour  Intake 1718.67 ml  Output      0 ml  Net 1718.67 ml    Last BM: 12/29   Labs:   Recent Labs Lab 05/16/13 0455  05/18/13 0425  05/20/13 0520 05/21/13 0500 05/22/13 0328  NA 141  < > 132*  < > 133* 130* 133*  K 3.3*  < > 3.8  < > 3.9 4.5 4.8  CL 102  < > 99  < > 100 97 98  CO2 27  < > 23  < > 23 23 25   BUN 16  < > 16  < > 15 17 18   CREATININE 0.89  < > 1.07  < > 0.97 1.00 1.14  CALCIUM 7.6*  < > 7.0*  < > 7.6* 7.7* 7.7*  MG 2.1  --  1.9  --   --   --   --   PHOS 2.4  --  2.6  --   --   --   --   GLUCOSE 193*  < > 274*  < > 196* 195* 157*  < > = values in this interval not displayed.  CBG (last 3)   Recent Labs  05/21/13 1658 05/21/13 2141 05/22/13 0740  GLUCAP 179* 179* 161*    Scheduled Meds: .  enoxaparin (LOVENOX) injection  40 mg Subcutaneous Q24H  . imipenem-cilastatin  500 mg Intravenous Q6H  . insulin aspart  0-15 Units Subcutaneous TID WC  . insulin aspart  0-5 Units Subcutaneous QHS  . insulin aspart  4 Units Subcutaneous TID WC  . insulin glargine  10 Units Subcutaneous BID  . lisinopril  5 mg Oral Daily  . metoprolol tartrate  25 mg Oral BID    Continuous Infusions: . sodium chloride 20 mL/hr at 05/22/13 0509    Loyce Dys, MS RD LDN Clinical Inpatient Dietitian Pager: [phone] Weekend/After hours pager: [phone]  .

## 2013-05-23 LAB — CBC
HCT: 32.6 % — ABNORMAL LOW (ref 39.0–52.0)
MCV: 92.4 fL (ref 78.0–100.0)
Platelets: 434 10*3/uL — ABNORMAL HIGH (ref 150–400)
RBC: 3.53 MIL/uL — ABNORMAL LOW (ref 4.22–5.81)
RDW: 13.5 % (ref 11.5–15.5)
WBC: 22.3 10*3/uL — ABNORMAL HIGH (ref 4.0–10.5)

## 2013-05-23 LAB — COMPREHENSIVE METABOLIC PANEL
AST: 31 U/L (ref 0–37)
BUN: 16 mg/dL (ref 6–23)
CO2: 21 mEq/L (ref 19–32)
Calcium: 8.2 mg/dL — ABNORMAL LOW (ref 8.4–10.5)
Chloride: 96 mEq/L (ref 96–112)
Creatinine, Ser: 1.12 mg/dL (ref 0.50–1.35)
GFR calc Af Amer: >90 mL/min (ref >90)
GFR calc non Af Amer: 78 mL/min — ABNORMAL LOW (ref >90)
Glucose, Bld: 119 mg/dL — ABNORMAL HIGH (ref 70–99)
Total Bilirubin: 1.1 mg/dL (ref 0.3–1.2)
Total Protein: 7.2 g/dL (ref 6.0–8.3)

## 2013-05-23 LAB — GLUCOSE, CAPILLARY: Glucose-Capillary: 115 mg/dL — ABNORMAL HIGH (ref 70–99)

## 2013-05-23 NOTE — Progress Notes (Signed)
Subjective: Feels hungry.  Much better today.  Objective: Vital signs in last 24 hours: Temp:  [97.9 F (36.6 C)-98.1 F (36.7 C)] 98.1 F (36.7 C) (12/30 0508) Pulse Rate:  [85-100] 85 (12/30 0508) Resp:  [16-18] 16 (12/30 0508) BP: (105-119)/(65-73) 119/73 mmHg (12/30 0508) SpO2:  [97 %-98 %] 98 % (12/30 0508) Last BM Date: 05/22/13  Intake/Output from previous day: 12/29 0701 - 12/30 0700 In: 100 [P.O.:100] Out: -  Intake/Output this shift:    General appearance: alert and no distress GI: soft, non-tender; bowel sounds normal; no masses,  no organomegaly  Lab Results:  Recent Labs  05/21/13 0500 05/22/13 0940  WBC 20.6* 23.8*  HGB 10.8* 11.3*  HCT 31.7* 33.6*  PLT 319 388   BMET  Recent Labs  05/21/13 0500 05/22/13 0328  NA 130* 133*  K 4.5 4.8  CL 97 98  CO2 23 25  GLUCOSE 195* 157*  BUN 17 18  CREATININE 1.00 1.14  CALCIUM 7.7* 7.7*   LFT  Recent Labs  05/22/13 0328  PROT 6.5  ALBUMIN 2.2*  AST 26  ALT 32  ALKPHOS 115  BILITOT 1.0   PT/INR No results found for this basename: LABPROT, INR,  in the last 72 hours Hepatitis Panel No results found for this basename: HEPBSAG, HCVAB, HEPAIGM, HEPBIGM,  in the last 72 hours C-Diff No results found for this basename: CDIFFTOX,  in the last 72 hours Fecal Lactopherrin No results found for this basename: FECLLACTOFRN,  in the last 72 hours  Studies/Results: Ct Abdomen Pelvis W Contrast  05/21/2013   CLINICAL DATA:  Pancreatitis, cholelithiasis, choledocholithiasis, increasing pain, post cholecystectomy with leukocytosis question intra-abdominal infection, history hypertension, diabetes  EXAM: CT ABDOMEN AND PELVIS WITH CONTRAST  TECHNIQUE: Multidetector CT imaging of the abdomen and pelvis was performed using the standard protocol following bolus administration of intravenous contrast. Sagittal and coronal MPR images reconstructed from axial data set.  CONTRAST:  OMNIPAQUE IOHEXOL 300 MG/ML   SOLN  COMPARISON:  None.  FINDINGS: Bibasilar atelectasis.  Gallbladder surgically absent.  Pancreas is markedly edematous with extensive peripancreatic edema in the anterior pararenal space compatible with acute pancreatitis.  Multiple areas of low attenuation within pain correct right arm are identified which could be related to more prominent edema or areas of necrosis.  No definite pancreatic hemorrhage seen.  Pancreatic ductal stent identified.  None of the observed peripancreatic fluid demonstrates significant mass effect to suggest discrete pseudocyst.  Liver, spleen, kidneys, and adrenal glands normal appearance.  Portal, superior mesenteric and splenic veins appear patent.  Small triangular fluid collection in the pelvis question ascites.  Stomach and bowel loops normal appearance.  No mass, adenopathy, free air or hernia.  Unremarkable bladder in ureters.  No acute osseous findings.  IMPRESSION: Persistent severe changes of acute pancreatitis with extensive peripancreatic edema and fluid throughout the anterior para renal space.  Portions of the pancreatic body/ tail are hypo attenuating, cannot exclude areas of pancreatic necrosis.  No discrete/mature pseudocyst is yet identified.  No evidence of venous occlusion.   Electronically Signed   By: Ulyses Southward M.Chandler.   On: 05/21/2013 14:14    Medications:  Scheduled: . enoxaparin (LOVENOX) injection  40 mg Subcutaneous Q24H  . imipenem-cilastatin  500 mg Intravenous Q6H  . insulin aspart  0-15 Units Subcutaneous TID WC  . insulin aspart  0-5 Units Subcutaneous QHS  . insulin aspart  4 Units Subcutaneous TID WC  . insulin glargine  10 Units  Subcutaneous BID  . lisinopril  5 mg Oral Daily  . metoprolol tartrate  25 mg Oral BID  . pantoprazole  40 mg Oral BID AC   Continuous: . sodium chloride 20 mL/hr at 05/22/13 3474    Assessment/Plan: 1) Acute severe gallstone pancreatitis. 2) Leukocytosis.   Clinically he looks much better.  He believes  his WBC will be down today.  No fever.  Plan: 1) Await WBC for the AM. 2) Continue with antibiotics and supportive care.  3) Advance to a full liquid diet.  LOS: 14 days   Nicolas Chandler 05/23/2013, 7:19 AM

## 2013-05-23 NOTE — Progress Notes (Signed)
WBC decreased Less abdominal pain  Will slowly advance diet Severe pancreatitis - improving  Wilmon Arms. Corliss Skains, MD, [facility] Surgery  General/ Trauma Surgery  05/23/2013 8:53 AM

## 2013-05-23 NOTE — Progress Notes (Signed)
[facility] PROGRESS NOTE  Nicolas Chandler ZOX:[id number] DOB: November 11, 1965 DOA: 05/09/2013 PCP: [name], MD Brief narrative:  47 year old patient with known history of diabetes hypertension and dyslipidemia. Presented to the hospital with a three-week history of abdominal pain. This pain worsened 24 hours prior to presentation and was associated with nausea and vomiting. He denied issues with fevers or chills chest pain or shortness of breath prior to presentation. Evaluation in the ER revealed elevated lipase and leukocytosis. Additional diagnostic evaluation revealed choledocholithiasis.    Assessment/Plan: 1. Severe acute gallstone pancreatitis - GI on board and currently managing will defer further recommendations to them.  2. patient is status post day 5 laparoscopic cholecystectomy - Defer further recommendations Gen. Surgery  3. Diabetes mellitus type 2 - Blood sugars relatively well controlled on current regimen - Once ready for discharge would recommend diabetic diet - Patient to monitor blood sugars at least 2 times a day once fasting and one postprandial - May discharge him home regimen once ready for discharge. Would hold metformin if serum creatinine equal or higher than 1.5  4. tachycardia -Resolved most likely secondary to primary problem  5. family history of hyper-or hypothyroidism - Thyroid studies reviewed and TSH within normal limits. Mild decrease in T3 levels - Would recommend repeating thyroid studies within the next 4-6 weeks.  I will sign off we will be available for any questions feel free to re-consult should any new concerns arise.   Code Status: Full Family Communication: Discussed with patient and spouse at bedside Disposition Plan: Per primary   Consultants:  Internal medicine: [facility]  -Gastroenterologist  Procedures:  Laparoscopic cholecystectomy as mentioned above.  Antibiotics:  Currently on  Primaxin  HPI/Subjective: Patient has no new complaints today.  Objective: Filed Vitals:   05/23/13 1325  BP: 114/69  Pulse: 79  Temp: 97.7 F (36.5 C)  Resp: 18   No intake or output data in the 24 hours ending 05/23/13 1503 Filed Weights   05/10/13 1525 05/12/13 0415 05/17/13 0433  Weight: 73 kg (160 lb 15 oz) 75.6 kg (166 lb 10.7 oz) 75.6 kg (166 lb 10.7 oz)    Exam:   General: Patient in no acute distress, alert, awake  Cardiovascular: Pink extremities  Respiratory: No increased work of breathing, no other wheezes  Abdomen: Nondistended, soft  Musculoskeletal: no cyanosis or clubbing   Data Reviewed: Basic Metabolic Panel:  Recent Labs Lab 05/18/13 0425 05/19/13 0500 05/20/13 0520 05/21/13 0500 05/22/13 0328 05/23/13 0659  NA 132* 131* 133* 130* 133* 133*  K 3.8 3.8 3.9 4.5 4.8 4.6  CL 99 97 100 97 98 96  CO2 23 25 23 23 25 21   GLUCOSE 274* 252* 196* 195* 157* 119*  BUN 16 14 15 17 18 16   CREATININE 1.07 0.99 0.97 1.00 1.14 1.12  CALCIUM 7.0* 7.2* 7.6* 7.7* 7.7* 8.2*  MG 1.9  --   --   --   --   --   PHOS 2.6  --   --   --   --   --    Liver Function Tests:  Recent Labs Lab 05/19/13 0500 05/20/13 0520 05/21/13 0500 05/22/13 0328 05/23/13 0659  AST 71* 40* 29 26 31   ALT 64* 53 40 32 32  ALKPHOS 95 105 116 115 127*  BILITOT 1.0 0.7 1.0 1.0 1.1  PROT 5.6* 5.9* 6.4 6.5 7.2  ALBUMIN 1.9* 1.9* 2.1* 2.2* 2.4*    Recent Labs Lab 05/22/13 0940 05/23/13 0659  LIPASE 86*  69*   No results found for this basename: AMMONIA,  in the last 168 hours CBC:  Recent Labs Lab 05/19/13 0500 05/20/13 0542 05/21/13 0500 05/22/13 0940 05/23/13 0659  WBC 20.0* 18.4* 20.6* 23.8* 22.3*  HGB 10.8* 11.0* 10.8* 11.3* 10.9*  HCT 32.3* 32.4* 31.7* 33.6* 32.6*  MCV 91.8 91.8 90.6 90.8 92.4  PLT 208 192 319 388 434*   Cardiac Enzymes: No results found for this basename: CKTOTAL, CKMB, CKMBINDEX, TROPONINI,  in the last 168 hours BNP (last 3 results) No  results found for this basename: PROBNP,  in the last 8760 hours CBG:  Recent Labs Lab 05/22/13 1137 05/22/13 1645 05/22/13 2246 05/23/13 0736 05/23/13 1133  GLUCAP 181* 123* 115* 115* 158*    No results found for this or any previous visit (from the past 240 hour(s)).   Studies: No results found.  Scheduled Meds: . enoxaparin (LOVENOX) injection  40 mg Subcutaneous Q24H  . imipenem-cilastatin  500 mg Intravenous Q6H  . insulin aspart  0-15 Units Subcutaneous TID WC  . insulin aspart  0-5 Units Subcutaneous QHS  . insulin aspart  4 Units Subcutaneous TID WC  . insulin glargine  10 Units Subcutaneous BID  . lisinopril  5 mg Oral Daily  . metoprolol tartrate  25 mg Oral BID  . pantoprazole  40 mg Oral BID AC   Continuous Infusions: . sodium chloride 20 mL/hr at 05/22/13 0509    Active Problems:   Choledocholithiasis   Abdominal pain   Leukocytosis   Hypokalemia   AKI (acute kidney injury)   DM (diabetes mellitus)   HTN (hypertension)   HLD (hyperlipidemia)   Pancreatitis   Elevated LFTs   Hepatic steatosis   Renal cyst   Thrombocytopenia, unspecified   Protein-calorie malnutrition, severe    Time spent: > 35 minutes    Penny Pia  [facility] Pager [phone] If 7PM-7AM, please contact night-coverage at [email or web address], password [facility] 05/23/2013, 3:03 PM  LOS: 14 days

## 2013-05-23 NOTE — Progress Notes (Signed)
6 Days Post-Op  Subjective: Pt feels much better today, pain is resolved.  No NV.  Is hungry for the first time.  Feels his WBC will be improved.  Ambulating well.    Objective: Vital signs in last 24 hours: Temp:  [97.9 F (36.6 C)-98.1 F (36.7 C)] 98.1 F (36.7 C) (12/30 0508) Pulse Rate:  [85-100] 85 (12/30 0508) Resp:  [16-18] 16 (12/30 0508) BP: (105-119)/(65-73) 119/73 mmHg (12/30 0508) SpO2:  [97 %-98 %] 98 % (12/30 0508) Last BM Date: 05/22/13  Intake/Output from previous day: 12/29 0701 - 12/30 0700 In: 100 [P.O.:100] Out: -  Intake/Output this shift:    PE: Gen:  Alert, NAD, pleasant Abd: Soft, NT/ND, +BS, no HSM, incisions C/D/I   Lab Results:   Recent Labs  05/21/13 0500 05/22/13 0940  WBC 20.6* 23.8*  HGB 10.8* 11.3*  HCT 31.7* 33.6*  PLT 319 388   BMET  Recent Labs  05/21/13 0500 05/22/13 0328  NA 130* 133*  K 4.5 4.8  CL 97 98  CO2 23 25  GLUCOSE 195* 157*  BUN 17 18  CREATININE 1.00 1.14  CALCIUM 7.7* 7.7*   PT/INR No results found for this basename: LABPROT, INR,  in the last 72 hours CMP     Component Value Date/Time   NA 133* 05/22/2013 0328   K 4.8 05/22/2013 0328   CL 98 05/22/2013 0328   CO2 25 05/22/2013 0328   GLUCOSE 157* 05/22/2013 0328   BUN 18 05/22/2013 0328   CREATININE 1.14 05/22/2013 0328   CALCIUM 7.7* 05/22/2013 0328   PROT 6.5 05/22/2013 0328   ALBUMIN 2.2* 05/22/2013 0328   AST 26 05/22/2013 0328   ALT 32 05/22/2013 0328   ALKPHOS 115 05/22/2013 0328   BILITOT 1.0 05/22/2013 0328   GFRNONAA 76* 05/22/2013 0328   GFRAA 88* 05/22/2013 0328   Lipase     Component Value Date/Time   LIPASE 86* 05/22/2013 0940       Studies/Results: Ct Abdomen Pelvis W Contrast  05/21/2013   CLINICAL DATA:  Pancreatitis, cholelithiasis, choledocholithiasis, increasing pain, post cholecystectomy with leukocytosis question intra-abdominal infection, history hypertension, diabetes  EXAM: CT ABDOMEN AND PELVIS WITH  CONTRAST  TECHNIQUE: Multidetector CT imaging of the abdomen and pelvis was performed using the standard protocol following bolus administration of intravenous contrast. Sagittal and coronal MPR images reconstructed from axial data set.  CONTRAST:  OMNIPAQUE IOHEXOL 300 MG/ML  SOLN  COMPARISON:  None.  FINDINGS: Bibasilar atelectasis.  Gallbladder surgically absent.  Pancreas is markedly edematous with extensive peripancreatic edema in the anterior pararenal space compatible with acute pancreatitis.  Multiple areas of low attenuation within pain correct right arm are identified which could be related to more prominent edema or areas of necrosis.  No definite pancreatic hemorrhage seen.  Pancreatic ductal stent identified.  None of the observed peripancreatic fluid demonstrates significant mass effect to suggest discrete pseudocyst.  Liver, spleen, kidneys, and adrenal glands normal appearance.  Portal, superior mesenteric and splenic veins appear patent.  Small triangular fluid collection in the pelvis question ascites.  Stomach and bowel loops normal appearance.  No mass, adenopathy, free air or hernia.  Unremarkable bladder in ureters.  No acute osseous findings.  IMPRESSION: Persistent severe changes of acute pancreatitis with extensive peripancreatic edema and fluid throughout the anterior para renal space.  Portions of the pancreatic body/ tail are hypo attenuating, cannot exclude areas of pancreatic necrosis.  No discrete/mature pseudocyst is yet identified.  No evidence  of venous occlusion.   Electronically Signed   By: Ulyses Southward M.D.   On: 05/21/2013 14:14    Anti-infectives: Anti-infectives   Start     Dose/Rate Route Frequency Ordered Stop   05/20/13 1200  imipenem-cilastatin (PRIMAXIN) 500 mg in sodium chloride 0.9 % 100 mL IVPB     500 mg 200 mL/hr over 30 Minutes Intravenous 4 times per day 05/20/13 1128     05/16/13 0200  imipenem-cilastatin (PRIMAXIN) 500 mg in sodium chloride 0.9 %  100 mL IVPB  Status:  Discontinued     500 mg 200 mL/hr over 30 Minutes Intravenous Every 6 hours 05/15/13 2045 05/19/13 0836   05/14/13 1330  imipenem-cilastatin (PRIMAXIN) 500 mg in sodium chloride 0.9 % 100 mL IVPB  Status:  Discontinued     500 mg 200 mL/hr over 30 Minutes Intravenous Every 6 hours 05/14/13 1247 05/15/13 2045   05/09/13 2200  Ampicillin-Sulbactam (UNASYN) 3 g in sodium chloride 0.9 % 100 mL IVPB  Status:  Discontinued     3 g 100 mL/hr over 60 Minutes Intravenous Every 6 hours 05/09/13 1733 05/13/13 1821   05/09/13 1630  Ampicillin-Sulbactam (UNASYN) 3 g in sodium chloride 0.9 % 100 mL IVPB     3 g 100 mL/hr over 60 Minutes Intravenous  Once 05/09/13 1605 05/09/13 1721       Assessment/Plan POD #6 s/p lap chole with IOC for biliary pancreatitis  Post-operative ileus - resolving  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 On 6N  2. Tolerating soft diet  3. Ambulate and IS  4. SCD's and Lovenox  5. Afebrile 6. WBC down to 22.3, continue primaxin  7. TNA d/c'ed  8. CT scan shows evidence of severe pancreatitis, but no abscess, possible pancreatic necrosis  9. Dr. Sharon Seller following DM, tyroid, tachycardia, Dr. Elnoria Howard following for pancreatitis  10. WBC improved so will advance to fulls.  Watch WBC tomorrow after advancing diet to fulls.  He may need to go home on fulls opposed to regular diet.  ? Length of primaxin treatment.  Question whether we need to back him off to NPO to allow pancrease to heal if WBC not improved or worse despite him tolerating the food, PANDA? 11.  Not ready for d/c today      LOS: 14 days    Nicolas Chandler, Nicolas Chandler 05/23/2013, 8:18 AM Pager: [phone]

## 2013-05-24 LAB — COMPREHENSIVE METABOLIC PANEL
ALT: 23 U/L (ref 0–53)
Albumin: 2.2 g/dL — ABNORMAL LOW (ref 3.5–5.2)
Alkaline Phosphatase: 104 U/L (ref 39–117)
BUN: 14 mg/dL (ref 6–23)
CO2: 23 mEq/L (ref 19–32)
Calcium: 7.9 mg/dL — ABNORMAL LOW (ref 8.4–10.5)
GFR calc Af Amer: >90 mL/min (ref >90)
GFR calc non Af Amer: 80 mL/min — ABNORMAL LOW (ref >90)
Glucose, Bld: 99 mg/dL (ref 70–99)
Potassium: 5 mEq/L (ref 3.7–5.3)
Sodium: 133 mEq/L — ABNORMAL LOW (ref 137–147)
Total Protein: 6.8 g/dL (ref 6.0–8.3)

## 2013-05-24 LAB — GLUCOSE, CAPILLARY
Glucose-Capillary: 105 mg/dL — ABNORMAL HIGH (ref 70–99)
Glucose-Capillary: 168 mg/dL — ABNORMAL HIGH (ref 70–99)
Glucose-Capillary: 112 mg/dL — ABNORMAL HIGH (ref 70–99)
Glucose-Capillary: 121 mg/dL — ABNORMAL HIGH (ref 70–99)

## 2013-05-24 LAB — LIPASE, BLOOD: Lipase: 85 U/L — ABNORMAL HIGH (ref 11–59)

## 2013-05-24 LAB — CBC
HCT: 32.6 % — ABNORMAL LOW (ref 39.0–52.0)
MCHC: 33.1 g/dL (ref 30.0–36.0)
MCV: 91.8 fL (ref 78.0–100.0)
Platelets: 417 10*3/uL — ABNORMAL HIGH (ref 150–400)
RDW: 13 % (ref 11.5–15.5)
WBC: 18.8 10*3/uL — ABNORMAL HIGH (ref 4.0–10.5)

## 2013-05-24 NOTE — Progress Notes (Signed)
Subjective: No new complaints.  Feeling well.  Objective: Vital signs in last 24 hours: Temp:  [97.2 F (36.2 C)-99.2 F (37.3 C)] 97.2 F (36.2 C) (12/31 0534) Pulse Rate:  [79-102] 85 (12/31 0534) Resp:  [16-18] 16 (12/31 0534) BP: (110-121)/(67-80) 110/67 mmHg (12/31 0534) SpO2:  [98 %-100 %] 100 % (12/31 0534) Last BM Date: 05/23/13  Intake/Output from previous day: 12/30 0701 - 12/31 0700 In: 540 [P.O.:120; I.V.:220; IV Piggyback:200] Out: -  Intake/Output this shift:    General appearance: alert and no distress GI: soft, non-tender; bowel sounds normal; no masses,  no organomegaly  Lab Results:  Recent Labs  05/22/13 0940 05/23/13 0659 05/24/13 0728  WBC 23.8* 22.3* 18.8*  HGB 11.3* 10.9* 10.8*  HCT 33.6* 32.6* 32.6*  PLT 388 434* 417*   BMET  Recent Labs  05/22/13 0328 05/23/13 0659  NA 133* 133*  K 4.8 4.6  CL 98 96  CO2 25 21  GLUCOSE 157* 119*  BUN 18 16  CREATININE 1.14 1.12  CALCIUM 7.7* 8.2*   LFT  Recent Labs  05/23/13 0659  PROT 7.2  ALBUMIN 2.4*  AST 31  ALT 32  ALKPHOS 127*  BILITOT 1.1   PT/INR No results found for this basename: LABPROT, INR,  in the last 72 hours Hepatitis Panel No results found for this basename: HEPBSAG, HCVAB, HEPAIGM, HEPBIGM,  in the last 72 hours C-Diff No results found for this basename: CDIFFTOX,  in the last 72 hours Fecal Lactopherrin No results found for this basename: FECLLACTOFRN,  in the last 72 hours  Studies/Results: No results found.  Medications:  Scheduled: . enoxaparin (LOVENOX) injection  40 mg Subcutaneous Q24H  . imipenem-cilastatin  500 mg Intravenous Q6H  . insulin aspart  0-15 Units Subcutaneous TID WC  . insulin aspart  0-5 Units Subcutaneous QHS  . insulin aspart  4 Units Subcutaneous TID WC  . insulin glargine  10 Units Subcutaneous BID  . lisinopril  5 mg Oral Daily  . metoprolol tartrate  25 mg Oral BID  . pantoprazole  40 mg Oral BID AC   Continuous: . sodium  chloride 20 mL/hr at 05/22/13 1610    Assessment/Plan: 1) Acute severe gallstone pancreatitis. 2) WBC elevation.   He is improving.  Diet changed back to clear liquids per Surgery.   WBC has increased.  Hopefully he can go home in the upcoming days.  Plan: 1) Diet per Surgery. 2) Continue supportive care.  LOS: 15 days   Ginny Loomer D 05/24/2013, 8:18 AM

## 2013-05-24 NOTE — Progress Notes (Signed)
ANTIBIOTIC CONSULT NOTE - FOLLOW UP  Pharmacy Consult for Primaxin Indication: Pancreatitis  No Known Allergies  Patient Measurements: Height: 5\' 6"  (167.6 cm) Weight: 166 lb 10.7 oz (75.6 kg) IBW/kg (Calculated) : 63.8 Adjusted Body Weight:    Vital Signs: Temp: 97.2 F (36.2 C) (12/31 0534) Temp src: Oral (12/31 0534) BP: 110/67 mmHg (12/31 0534) Pulse Rate: 85 (12/31 0534) Intake/Output from previous day: 12/30 0701 - 12/31 0700 In: 540 [P.O.:120; I.V.:220; IV Piggyback:200] Out: -  Intake/Output from this shift:    Labs:  Recent Labs  05/22/13 0328 05/22/13 0940 05/23/13 0659  WBC  --  23.8* 22.3*  HGB  --  11.3* 10.9*  PLT  --  388 434*  CREATININE 1.14  --  1.12   Estimated Creatinine Clearance: 75.2 ml/min (by C-G formula based on Cr of 1.12). No results found for this basename: VANCOTROUGH, Leodis Binet, VANCORANDOM, GENTTROUGH, GENTPEAK, GENTRANDOM, TOBRATROUGH, TOBRAPEAK, TOBRARND, AMIKACINPEAK, AMIKACINTROU, AMIKACIN,  in the last 72 hours   Microbiology: Recent Results (from the past 720 hour(s))  URINE CULTURE     Status: None   Collection Time    05/09/13  1:47 PM      Result Value Range Status   Specimen Description URINE, CLEAN CATCH   Final   Special Requests Normal   Final   Culture  Setup Time     Final   Value: 05/09/2013 22:24     Performed at [facility] Count     Final   Value: NO GROWTH     Performed at [facility]   Culture     Final   Value: NO GROWTH     Performed at [facility]   Report Status 05/10/2013 FINAL   Final  CULTURE, BLOOD (ROUTINE X 2)     Status: None   Collection Time    05/11/13  9:35 AM      Result Value Range Status   Specimen Description BLOOD RIGHT ANTECUBITAL   Final   Special Requests     Final   Value: BOTTLES DRAWN AEROBIC AND ANAEROBIC 10CC AER 5CC ANA   Culture  Setup Time     Final   Value: 05/11/2013 14:59     Performed at [facility]   Culture      Final   Value: NO GROWTH 5 DAYS     Performed at [facility]   Report Status 05/17/2013 FINAL   Final  CULTURE, BLOOD (ROUTINE X 2)     Status: None   Collection Time    05/11/13  9:50 AM      Result Value Range Status   Specimen Description BLOOD WRIST RIGHT   Final   Special Requests BOTTLES DRAWN AEROBIC AND ANAEROBIC 10CC   Final   Culture  Setup Time     Final   Value: 05/11/2013 14:59     Performed at [facility]   Culture     Final   Value: NO GROWTH 5 DAYS     Performed at [facility]   Report Status 05/17/2013 FINAL   Final    Anti-infectives   Start     Dose/Rate Route Frequency Ordered Stop   05/20/13 1200  imipenem-cilastatin (PRIMAXIN) 500 mg in sodium chloride 0.9 % 100 mL IVPB     500 mg 200 mL/hr over 30 Minutes Intravenous 4 times per day 05/20/13 1128     05/16/13 0200  imipenem-cilastatin (PRIMAXIN)  500 mg in sodium chloride 0.9 % 100 mL IVPB  Status:  Discontinued     500 mg 200 mL/hr over 30 Minutes Intravenous Every 6 hours 05/15/13 2045 05/19/13 0836   05/14/13 1330  imipenem-cilastatin (PRIMAXIN) 500 mg in sodium chloride 0.9 % 100 mL IVPB  Status:  Discontinued     500 mg 200 mL/hr over 30 Minutes Intravenous Every 6 hours 05/14/13 1247 05/15/13 2045   05/09/13 2200  Ampicillin-Sulbactam (UNASYN) 3 g in sodium chloride 0.9 % 100 mL IVPB  Status:  Discontinued     3 g 100 mL/hr over 60 Minutes Intravenous Every 6 hours 05/09/13 1733 05/13/13 1821   05/09/13 1630  Ampicillin-Sulbactam (UNASYN) 3 g in sodium chloride 0.9 % 100 mL IVPB     3 g 100 mL/hr over 60 Minutes Intravenous  Once 05/09/13 1605 05/09/13 1721      Assessment: [name] who presents with abdominal pain for the past 3 weeks that acutely worsened this morning. Also has N/V. Patient's RUQ US shows gallstones with mild gallbladder thickening.   PMH: DM, HTN  Anticoagulation: lmwh 40. CBC stable  Infectious Disease:unayn changed to Imipenem per RX  #11. Pancreatitis/ Cholecystitis, WBC 22.3. Source of leukocytosis unknown.  Unasyn 12/16>>12/27 Imipenem 12/21>>  12/18 BCx2: Negative Urine Cx 12/16>> Negative  Cardiovascular: HTN, HLD. VSS on lisinopril, metoprolol.  Endocrinology: DM on SSI, Lantus. CBGs 84-158  Gastrointestinal / Nutrition: Full liquid diet for biliary pancreatitis now off TPN.Severe acute gallstone pancreatitis s/p cholecystectomy. +BM. Po PPI. Feels hungry. 12/28: CT scan shows evidence of severe pancreatitis, but no abscess, possible pancreatic necrosis.  Neurology:  Nephrology: Scr 1.12 stable. CrCl 75  Pulmonary: 98%  Hematology / Oncology: CBC stable  Home medication Issues:  Best Practices  Plan: Imipenem 500mg  IV q6h dose remains ok. Slowly advancing diet.   Trendon Zaring S. Merilynn Finland, PharmD, BCPS Clinical Staff Pharmacist Pager [phone]  Misty Stanley Stillinger 05/24/2013,7:46 AM

## 2013-05-25 LAB — COMPREHENSIVE METABOLIC PANEL
ALT: 21 U/L (ref 0–53)
AST: 25 U/L (ref 0–37)
Albumin: 2.4 g/dL — ABNORMAL LOW (ref 3.5–5.2)
Alkaline Phosphatase: 110 U/L (ref 39–117)
BUN: 12 mg/dL (ref 6–23)
CO2: 22 mEq/L (ref 19–32)
Calcium: 8.4 mg/dL (ref 8.4–10.5)
Chloride: 98 mEq/L (ref 96–112)
Creatinine, Ser: 1.04 mg/dL (ref 0.50–1.35)
GFR calc Af Amer: >90 mL/min (ref >90)
GFR calc non Af Amer: 85 mL/min — ABNORMAL LOW (ref >90)
Glucose, Bld: 179 mg/dL — ABNORMAL HIGH (ref 70–99)
Potassium: 5 mEq/L (ref 3.7–5.3)
Sodium: 132 mEq/L — ABNORMAL LOW (ref 137–147)
Total Bilirubin: 0.8 mg/dL (ref 0.3–1.2)
Total Protein: 7.5 g/dL (ref 6.0–8.3)

## 2013-05-25 LAB — CBC
HCT: 33.7 % — ABNORMAL LOW (ref 39.0–52.0)
Hemoglobin: 11.3 g/dL — ABNORMAL LOW (ref 13.0–17.0)
MCH: 30.4 pg (ref 26.0–34.0)
MCHC: 33.5 g/dL (ref 30.0–36.0)
MCV: 90.6 fL (ref 78.0–100.0)
Platelets: 543 10*3/uL — ABNORMAL HIGH (ref 150–400)
RBC: 3.72 MIL/uL — ABNORMAL LOW (ref 4.22–5.81)
RDW: 12.9 % (ref 11.5–15.5)
WBC: 16.2 10*3/uL — ABNORMAL HIGH (ref 4.0–10.5)

## 2013-05-25 LAB — GLUCOSE, CAPILLARY: Glucose-Capillary: 101 mg/dL — ABNORMAL HIGH (ref 70–99)

## 2013-05-25 MED ORDER — HYDROCODONE-ACETAMINOPHEN 5-325 MG PO TABS: 1.0000 | ORAL_TABLET | Freq: Four times a day (QID) | ORAL | Status: DC | PRN

## 2013-05-25 MED ORDER — AMOXICILLIN-POT CLAVULANATE 875-125 MG PO TABS: 1.0000 | ORAL_TABLET | Freq: Two times a day (BID) | ORAL | Status: DC

## 2013-05-25 NOTE — Progress Notes (Signed)
General Surgery Note  LOS: 16 days  POD -  8 Days Post-Op  Assessment/Plan: 1.  LAPAROSCOPIC CHOLECYSTECTOMY WITH INTRAOPERATIVE CHOLANGIOGRAM - J. Wyatt - 05/17/2013  ERCP with stent placement - P. Hung - 05/10/2013  WBC - 16,200 - 05/25/2013  On Primaxin 05/14/2013 >>>  Feels okay.  Ready to go home.  Wife in room.  Discharge instructions reviewed with patient.  Will send patient home on Augmentin for 3 days (this should total 14 days of antibiotics)  2.  Gallstone pancreatitis 3.  Diabetes mellitus 4.  DVT prophylaxis - Lovenox  Active Problems:   Choledocholithiasis   Abdominal pain   Leukocytosis   Hypokalemia   AKI (acute kidney injury)   DM (diabetes mellitus)   HTN (hypertension)   HLD (hyperlipidemia)   Pancreatitis   Elevated LFTs   Hepatic steatosis   Renal cyst   Thrombocytopenia, unspecified   Protein-calorie malnutrition, severe  Subjective:  Objective:   Filed Vitals:   05/25/13 0950  BP: 97/69  Pulse:   Temp:   Resp:      Intake/Output from previous day:  12/31 0701 - 01/01 0700 In: 1120 [P.O.:240; I.V.:480; IV Piggyback:400] Out: -   Intake/Output this shift:      Physical Exam:   General: WN Asian M who is alert and oriented.    HEENT: Normal. Pupils equal. .   Lungs: Clear.   Abdomen: Soft   Wound: Okay.  Some minimal drainage from wound at umbilicus.   Lab Results:    Recent Labs  05/24/13 0728 05/25/13 0939  WBC 18.8* 16.2*  HGB 10.8* 11.3*  HCT 32.6* 33.7*  PLT 417* 543*    BMET   Recent Labs  05/23/13 0659 05/24/13 0728  NA 133* 133*  K 4.6 5.0  CL 96 98  CO2 21 23  GLUCOSE 119* 99  BUN 16 14  CREATININE 1.12 1.09  CALCIUM 8.2* 7.9*    PT/INR  No results found for this basename: LABPROT, INR,  in the last 72 hours  ABG  No results found for this basename: PHART, PCO2, PO2, HCO3,  in the last 72 hours   Studies/Results:  No results found.   Anti-infectives:   Anti-infectives   Start     Dose/Rate Route  Frequency Ordered Stop   05/20/13 1200  imipenem-cilastatin (PRIMAXIN) 500 mg in sodium chloride 0.9 % 100 mL IVPB     500 mg 200 mL/hr over 30 Minutes Intravenous 4 times per day 05/20/13 1128     05/16/13 0200  imipenem-cilastatin (PRIMAXIN) 500 mg in sodium chloride 0.9 % 100 mL IVPB  Status:  Discontinued     500 mg 200 mL/hr over 30 Minutes Intravenous Every 6 hours 05/15/13 2045 05/19/13 0836   05/14/13 1330  imipenem-cilastatin (PRIMAXIN) 500 mg in sodium chloride 0.9 % 100 mL IVPB  Status:  Discontinued     500 mg 200 mL/hr over 30 Minutes Intravenous Every 6 hours 05/14/13 1247 05/15/13 2045   05/09/13 2200  Ampicillin-Sulbactam (UNASYN) 3 g in sodium chloride 0.9 % 100 mL IVPB  Status:  Discontinued     3 g 100 mL/hr over 60 Minutes Intravenous Every 6 hours 05/09/13 1733 05/13/13 1821   05/09/13 1630  Ampicillin-Sulbactam (UNASYN) 3 g in sodium chloride 0.9 % 100 mL IVPB     3 g 100 mL/hr over 60 Minutes Intravenous  Once 05/09/13 1605 05/09/13 1721      Nicolas Kinavid Winston Misner, MD, FACS Pager: [phone] [facility]  Office: [phone] 05/25/2013

## 2013-05-25 NOTE — Progress Notes (Signed)
Discharge patient. Home discharge instruction given to patient, no question verbalized.

## 2013-05-25 NOTE — Discharge Instructions (Signed)
[facility] - DISCHARGE INSTRUCTIONS TO PATIENT  Return to work on:  In one week  Activity:  Driving - No driving x 3 days, then may drive if doing well.   Lifting - No lifting greater than 15 pounds for 1 week.  Wound Care:   May shower  Diet:  As tolerated.  Wound avoid fatty/greasy food for about 1 month.  Follow up appointment:  Call Dr. Dixon BoosWyatt's office [facility]) at [phone] for an appointment in 2 to 3 weeks.  Medications and dosages:  Resume your home medications.  You have a prescription for:  Vicodin and Augmentin  Call Dr. Lindie SpruceWyatt or his office  [phone]) if you have:  Temperature greater than 100.4,  Persistent nausea and vomiting,  Severe uncontrolled pain,  Redness, tenderness, or signs of infection (pain, swelling, redness, odor or green/yellow discharge around the site),  Difficulty breathing, headache or visual disturbances,  Any other questions or concerns you may have after discharge.  In an emergency, call 911 or go to an Emergency Department at a nearby hospital.

## 2013-05-26 DIAGNOSIS — R7989 Other specified abnormal findings of blood chemistry: Secondary | ICD-10-CM | POA: Clinically undetermined

## 2013-05-26 NOTE — Progress Notes (Signed)
Delayed entry  7 Days Post-Op   Subjective:  Pt feels overall well, pain is resolved. No NV. Is hungry for the first time. He hopes his WBC is improving Ambulating well.   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 On 6N  2. Tolerating soft diet  3. Ambulate and IS  4. SCD's and Lovenox  5. Afebrile  6. WBC down to 18.8, continue primaxin  7. TNA d/c'ed  8. CT scan shows evidence of severe pancreatitis, but no abscess, possible pancreatic necrosis  9. Dr. Sharon SellerMcClung following DM, tyroid, tachycardia, Dr. Elnoria HowardHung following for pancreatitis  10. WBC improved cont fulls. Trend WBC. He may need to go home on fulls opposed to regular diet. ? Length of primaxin treatment. Question whether we need to back him off to NPO to allow pancrease to heal if WBC not improved or worse despite him tolerating the food, PANDA?  11. Not ready for d/c today, hopefully tomorrow  LOS: 15 days

## 2013-05-26 NOTE — Discharge Summary (Signed)
Physician Discharge Summary  Patient ID: Nicolas Chandler MRN: [id number] DOB/AGE: Aug 30, 1965 48 y.o.  Admit date: 05/09/2013 Discharge date: 05/25/2013  Admitting Diagnosis: Elevated LFT's Gallstone pancreatitis Severe pancreatitis with evidence of necrosis Cholelithiasis Abdominal pain Leukocytosis AKI DM HTN HLD PCM Hepatic steatosis Renals cysts Low thyroixine-3  H/o goiter   Discharge Diagnosis Patient Active Problem List   Diagnosis Date Noted  . Decreased thyroxine-3  level 05/26/2013  . Goiter 05/22/2013  . Protein-calorie malnutrition, severe 05/15/2013  . Hepatic steatosis 05/12/2013  . Renal cyst 05/12/2013  . Thrombocytopenia, unspecified 05/12/2013  . Choledocholithiasis 05/09/2013  . Abdominal pain 05/09/2013  . Hypokalemia 05/09/2013  . AKI (acute kidney injury) 05/09/2013  . DM (diabetes mellitus) 05/09/2013  . HTN (hypertension) 05/09/2013  . HLD (hyperlipidemia) 05/09/2013  . Pancreatitis 05/09/2013  . Elevated LFTs 05/09/2013    Consultants Dr. Sharon Seller & Dr. Cena Benton - Internal medicine Dr. Melburn Popper - Cardiology Dr. Elnoria Howard - Gastroenterology  Imaging: No results found.  Procedures Dr. Lindie Spruce (05/17/13) - Laparoscopic Cholecystectomy with IOC Dr. Elnoria Howard (05/10/13) - ERCP, no sphincterotomy was created  Hospital Course:  48 y/o [place] gentleman who started having abdominal pain mostly at night about 2-3 weeks prior to admission. He thought it was just reflux or something like that. He just tolerated it. On 05/09/13 his pain became significantly worse, he says it hurts to breath deeply. He has become jaundice and he presented to the ER. Work up shows elevated LFT'S, lipase >3000, elevated WBC 34,000. K+ 2.9, US shows: Cholelithiasis with mild gallbladder wall thickening. There is complex fluid in the right upper quadrant suggesting an acute inflammatory process.  CT SCAN shows extensive fluid and inflammation in the right upper quadrant, centered around the  duodenum. There is marked asymmetric  duodenal wall thickening with stones in the distal common bile duct. Evidence for pancreatitis. Multiple gallstones and cannot exclude cholecystitis. Decreased attenuation of the liver suggests hepatic steatosis.   Dr. Elnoria Howard from GI completed an ERCP on 05/10/13. WBC was improving on Unasyn, IV. LFT's show ongoing elevation. We are ask to see and discuss cholecystectomy. His LFT's slowly improved, as well as creatinine, CBC between 12/20 to 12/23.  On day of surgery his WBC rose to 18.8.  Patient underwent procedure listed above.  Tolerated procedure well and was transferred to the floor.  His diet was advanced as tolerated and he was tolerating a low fat diet with minimal pain.  Over the next few days it stayed between 18-22.  Because it rose so high we backed him off to clear liquids and slowly re-advanced his diet.  Secondary to his CBC being elevated we evaluated his urine and chest for signs of infection as well as ordering a CT scan to evaluate his abdomen.  The urinalysis and CXR were not impressive and the CT scan showed continued severe pancreatitis with inflammation with evidence of necrosis.  We ordered thyroid labs after finding out the patient had a strong family history for thyroid disease.  His TSH and T4 were normal, but his T3 slightly low.  His blood sugars were out of control during his hospital stay and thus the hospitalist were essential in controlling his diabetes.  It was expected for these to be elevated due to the severity of his pancreatitis.  They noted he may need to be started on insulin in the near future, but his sugars were improved prior to discharge.  They also recommended repeat thyroid studies in 4-6 weeks.  His WBC trended  down to 18.8 then to 16.2.  His LFT's also continued to improve.  On POD #8, the patient was voiding well, tolerating diet, ambulating well, pain well controlled, vital signs stable, incisions c/d/i and felt stable for  discharge home.  Patient will follow up in our office in 2-3 weeks with Dr. Lindie SpruceWyatt given the complexity of his case and knows to call with questions or concerns.  He will need recheck LFT's, lipase, and CBC as well as a repeat CT scan.  The office will arrange for these.  He was urged to continue his full liquid diet for another week prior to adding back in solid foods.  He will d/c home on PO Augmentin as well as prophylaxis.  He will need to f/u with his PCP at discharge as well to review his hospital course and address any medical concerns regarding his diabetes, thyroid, HTN, hepatic steatosis, and renal cysts.      Medication List         amoxicillin-clavulanate 875-125 MG per tablet  Commonly known as:  AUGMENTIN  Take 1 tablet by mouth 2 (two) times daily.     glimepiride 4 MG tablet  Commonly known as:  AMARYL  Take 4 mg by mouth 2 (two) times daily.     HYDROcodone-acetaminophen 5-325 MG per tablet  Commonly known as:  NORCO/VICODIN  Take 1-2 tablets by mouth every 6 (six) hours as needed.     lisinopril 5 MG tablet  Commonly known as:  PRINIVIL,ZESTRIL  Take 5 mg by mouth daily.     metFORMIN 500 MG tablet  Commonly known as:  GLUCOPHAGE  Take 500 mg by mouth 2 (two) times daily with a meal.     OVER THE COUNTER MEDICATION  Take 1 tablet by mouth once.     oxyCODONE-acetaminophen 5-325 MG per tablet  Commonly known as:  PERCOCET/ROXICET  Take 1-2 tablets by mouth every 6 (six) hours as needed for moderate pain or severe pain.     simvastatin 20 MG tablet  Commonly known as:  ZOCOR  Take 20 mg by mouth daily.             Follow-up Information   Follow up with WYATT, Marta LamasJAMES O, MD. Schedule an appointment as soon as possible for a visit in 2 weeks.   Specialty:  General Surgery   Contact information:   [address]  [facility] [place] [address] [phone]       Schedule an appointment as soon as possible for a visit with  Theda BelfastHUNG,PATRICK D, MD.   Specialty:  Gastroenterology   Contact information:   [address], [address] [place] [address] [phone]       Follow up with WIGAND-BOLLING,GWENDOLYN, MD. (To follow up regarding your blood sugars, blood pressure, thyroid)    Specialty:  Family Medicine   Contact information:   [address] [place] [address] [phone]       Signed: Aris GeorgiaMegan Dort, [facility] [phone]  05/26/2013, 9:28 AM  Agree with above.  Ovidio Kinavid Lis Savitt, MD, [facility] Pager: [phone] Office phone:  [phone]

## 2013-06-07 ENCOUNTER — Telehealth (INDEPENDENT_AMBULATORY_CARE_PROVIDER_SITE_OTHER): Payer: Self-pay | Admitting: *Deleted

## 2013-06-07 ENCOUNTER — Other Ambulatory Visit (INDEPENDENT_AMBULATORY_CARE_PROVIDER_SITE_OTHER): Payer: Self-pay

## 2013-06-07 ENCOUNTER — Telehealth (INDEPENDENT_AMBULATORY_CARE_PROVIDER_SITE_OTHER): Payer: Self-pay

## 2013-06-07 DIAGNOSIS — K851 Biliary acute pancreatitis without necrosis or infection: Secondary | ICD-10-CM

## 2013-06-07 NOTE — Telephone Encounter (Signed)
Spoke to pt and told him he needs below test and labs before visit next week. Pt aware. Eulah CitizenStevie will call with CT date.

## 2013-06-07 NOTE — Telephone Encounter (Signed)
Message copied by Brennan BaileyBROOKS, Dalya Maselli on Wed Jun 07, 2013  9:39 AM ------      Message from: Frederik SchmidtWYATT, JAY      Created: Wed Jun 07, 2013  7:49 AM       CT scan of the abdomen and pelvis with contrast, Cmet, Lipase level, CBCV with diff      ----- Message -----         From: Brennan BaileyMichelle Damoni Erker, CMA         Sent: 06/06/2013   8:41 AM           To: Cherylynn RidgesJames O Wyatt, MD            This pt is coming in next week to see you. Aundra MilletMegan said you may want a CT and/or labs before his appt. Do I need to set anything up before Tuesday?      Marcelino Duster-Tavarius Grewe       ------

## 2013-06-07 NOTE — Telephone Encounter (Signed)
I spoke with pt and informed him of CT appt at [facility] on 06/12/13 with an arrival time of 2:45pm.  I instructed him to go tomorrow or Friday to GI and pick up his contrast as well as to [facility] to [facility] his labs drawn.  He read me back the instructions given and verbalized understanding.

## 2013-06-09 ENCOUNTER — Other Ambulatory Visit (INDEPENDENT_AMBULATORY_CARE_PROVIDER_SITE_OTHER): Payer: Self-pay | Admitting: General Surgery

## 2013-06-09 DIAGNOSIS — K851 Biliary acute pancreatitis without necrosis or infection: Secondary | ICD-10-CM

## 2013-06-09 LAB — COMPREHENSIVE METABOLIC PANEL
ALT: 22 U/L (ref 0–53)
AST: 17 U/L (ref 0–37)
Albumin: 3.2 g/dL — ABNORMAL LOW (ref 3.5–5.2)
Alkaline Phosphatase: 96 U/L (ref 39–117)
BUN: 21 mg/dL (ref 6–23)
CALCIUM: 8.6 mg/dL (ref 8.4–10.5)
CHLORIDE: 95 meq/L — AB (ref 96–112)
CO2: 23 meq/L (ref 19–32)
CREATININE: 1.23 mg/dL (ref 0.50–1.35)
Glucose, Bld: 180 mg/dL — ABNORMAL HIGH (ref 70–99)
Potassium: 4.2 mEq/L (ref 3.5–5.3)
Sodium: 128 mEq/L — ABNORMAL LOW (ref 135–145)
TOTAL PROTEIN: 6.7 g/dL (ref 6.0–8.3)
Total Bilirubin: 1.2 mg/dL (ref 0.3–1.2)

## 2013-06-09 LAB — LIPASE: LIPASE: 14 U/L (ref 0–75)

## 2013-06-10 LAB — CBC WITH DIFFERENTIAL/PLATELET
BASOS PCT: 0 % (ref 0–1)
Basophils Absolute: 0 10*3/uL (ref 0.0–0.1)
EOS ABS: 0.2 10*3/uL (ref 0.0–0.7)
Eosinophils Relative: 2 % (ref 0–5)
HCT: 33.3 % — ABNORMAL LOW (ref 39.0–52.0)
Hemoglobin: 11 g/dL — ABNORMAL LOW (ref 13.0–17.0)
Lymphocytes Relative: 21 % (ref 12–46)
Lymphs Abs: 2.1 10*3/uL (ref 0.7–4.0)
MCH: 29.6 pg (ref 26.0–34.0)
MCHC: 33 g/dL (ref 30.0–36.0)
MCV: 89.8 fL (ref 78.0–100.0)
MONOS PCT: 9 % (ref 3–12)
Monocytes Absolute: 1 10*3/uL (ref 0.1–1.0)
NEUTROS ABS: 6.9 10*3/uL (ref 1.7–7.7)
NEUTROS PCT: 68 % (ref 43–77)
PLATELETS: 243 10*3/uL (ref 150–400)
RBC: 3.71 MIL/uL — ABNORMAL LOW (ref 4.22–5.81)
RDW: 14 % (ref 11.5–15.5)
WBC: 10.2 10*3/uL (ref 4.0–10.5)

## 2013-06-12 ENCOUNTER — Ambulatory Visit
Admission: RE | Admit: 2013-06-12 | Discharge: 2013-06-12 | Disposition: A | Discharge status: Home / Self Care | Payer: BC Managed Care – PPO | Source: Ambulatory Visit | Attending: General Surgery | Admitting: General Surgery

## 2013-06-12 DIAGNOSIS — K851 Biliary acute pancreatitis without necrosis or infection: Secondary | ICD-10-CM

## 2013-06-12 MED ORDER — IOHEXOL 300 MG/ML  SOLN
100.0000 mL | Freq: Once | INTRAMUSCULAR | Status: AC | PRN
  Administered 2013-06-12: 100 mL via INTRAVENOUS

## 2013-06-13 ENCOUNTER — Ambulatory Visit (INDEPENDENT_AMBULATORY_CARE_PROVIDER_SITE_OTHER): Payer: BC Managed Care – PPO | Admitting: General Surgery

## 2013-06-13 ENCOUNTER — Other Ambulatory Visit (INDEPENDENT_AMBULATORY_CARE_PROVIDER_SITE_OTHER): Payer: Self-pay

## 2013-06-13 ENCOUNTER — Encounter (INDEPENDENT_AMBULATORY_CARE_PROVIDER_SITE_OTHER): Payer: Self-pay | Admitting: General Surgery

## 2013-06-13 VITALS — BP 98/70 | HR 96 | Temp 97.8°F | Resp 14 | Ht 67.5 in | Wt 138.4 lb

## 2013-06-13 DIAGNOSIS — K859 Acute pancreatitis without necrosis or infection, unspecified: Secondary | ICD-10-CM

## 2013-06-13 MED ORDER — PROMETHAZINE HCL 12.5 MG PO TABS: 12.5000 mg | ORAL_TABLET | Freq: Four times a day (QID) | ORAL | Status: DC | PRN

## 2013-06-13 NOTE — Progress Notes (Signed)
This is the first postoperative visit for this patient who had severe pancreatitis related to gallstones. He underwent a cholecystectomy and has been recovering since that time. Because of concerns of a decreased appetite and continued abdominal discomfort a CT scan of the abdomen and pelvis was done yesterday which shows severe pancreatitis with peripancreatic air and inflammation. The CT scan looks much worse and the patient is clinically.  On examination today the patient walked in under his own power. He is afebrile. His other vital signs are stable. He has a pulse of 96 and blood pressure 98/70 and his temperature is 97.8. His abdomen is mildly distended with normal active than perhaps hyperactive bowel sounds. His abdomen is not significantly tender to a level of 2/10. He has no rebound or guarding.  I examined the patient partially with one of my partners who agrees that the patient looks much better clinically than the CT scan does radiologically. I will see the patient back in 2-3 weeks and reexamine him. Also consider repeating his CT scan of his abdomen and pelvis along with some laboratory studies at that time. I've advised the patient to avoid oily foods and spicy foods and remained pretty much on a bland diet. Vegetables and fruits have been recommended along with liquids. He should avoid carbonated drinks  I will see the patient shortly.

## 2013-06-16 LAB — COMPREHENSIVE METABOLIC PANEL
ALBUMIN: 3.1 g/dL — AB (ref 3.5–5.2)
ALK PHOS: 120 U/L — AB (ref 39–117)
ALT: 34 U/L (ref 0–53)
AST: 26 U/L (ref 0–37)
BUN: 6 mg/dL (ref 6–23)
CO2: 27 mEq/L (ref 19–32)
CREATININE: 0.98 mg/dL (ref 0.50–1.35)
Calcium: 8.5 mg/dL (ref 8.4–10.5)
Chloride: 98 mEq/L (ref 96–112)
Glucose, Bld: 234 mg/dL — ABNORMAL HIGH (ref 70–99)
POTASSIUM: 4.8 meq/L (ref 3.5–5.3)
Sodium: 132 mEq/L — ABNORMAL LOW (ref 135–145)
Total Bilirubin: 0.7 mg/dL (ref 0.3–1.2)
Total Protein: 6.4 g/dL (ref 6.0–8.3)

## 2013-06-16 LAB — LIPASE: Lipase: 20 U/L (ref 0–75)

## 2013-06-17 LAB — CBC WITH DIFFERENTIAL/PLATELET
BASOS ABS: 0 10*3/uL (ref 0.0–0.1)
BASOS PCT: 0 % (ref 0–1)
Eosinophils Absolute: 0.1 10*3/uL (ref 0.0–0.7)
Eosinophils Relative: 0 % (ref 0–5)
HEMATOCRIT: 34.4 % — AB (ref 39.0–52.0)
Hemoglobin: 11.4 g/dL — ABNORMAL LOW (ref 13.0–17.0)
Lymphocytes Relative: 23 % (ref 12–46)
Lymphs Abs: 3.8 10*3/uL (ref 0.7–4.0)
MCH: 29.9 pg (ref 26.0–34.0)
MCHC: 33.1 g/dL (ref 30.0–36.0)
MCV: 90.3 fL (ref 78.0–100.0)
MONO ABS: 0.8 10*3/uL (ref 0.1–1.0)
Monocytes Relative: 5 % (ref 3–12)
NEUTROS ABS: 12 10*3/uL — AB (ref 1.7–7.7)
Neutrophils Relative %: 72 % (ref 43–77)
PLATELETS: 285 10*3/uL (ref 150–400)
RBC: 3.81 MIL/uL — ABNORMAL LOW (ref 4.22–5.81)
RDW: 14.3 % (ref 11.5–15.5)
WBC: 16.6 10*3/uL — AB (ref 4.0–10.5)

## 2013-06-21 ENCOUNTER — Ambulatory Visit
Admission: RE | Admit: 2013-06-21 | Discharge: 2013-06-21 | Disposition: A | Discharge status: Home / Self Care | Payer: BC Managed Care – PPO | Source: Ambulatory Visit | Attending: General Surgery | Admitting: General Surgery

## 2013-06-21 ENCOUNTER — Telehealth (INDEPENDENT_AMBULATORY_CARE_PROVIDER_SITE_OTHER): Payer: Self-pay | Admitting: *Deleted

## 2013-06-21 DIAGNOSIS — K859 Acute pancreatitis without necrosis or infection, unspecified: Secondary | ICD-10-CM

## 2013-06-21 MED ORDER — IOHEXOL 300 MG/ML  SOLN
100.0000 mL | Freq: Once | INTRAMUSCULAR | Status: AC | PRN
  Administered 2013-06-21: 100 mL via INTRAVENOUS

## 2013-06-21 NOTE — Telephone Encounter (Signed)
Late Note: 1100a: Diannia RuderKara called to let Dr. Lindie SpruceWyatt know that the CT Abd/Pelvis results were in Epic.  I paged Dr. Lindie SpruceWyatt at that time to make him aware of the results in Epic.  They are allowing the patient to go home but they told the patient we may be calling him once Dr. Lindie SpruceWyatt has reviewed the scan and the patient states understanding.

## 2013-06-27 ENCOUNTER — Encounter (INDEPENDENT_AMBULATORY_CARE_PROVIDER_SITE_OTHER): Payer: Self-pay | Admitting: General Surgery

## 2013-06-27 ENCOUNTER — Ambulatory Visit (INDEPENDENT_AMBULATORY_CARE_PROVIDER_SITE_OTHER): Payer: BC Managed Care – PPO | Admitting: General Surgery

## 2013-06-27 VITALS — BP 96/68 | HR 72 | Temp 97.8°F | Resp 14 | Ht 65.75 in | Wt 129.4 lb

## 2013-06-27 DIAGNOSIS — K8591 Acute pancreatitis with uninfected necrosis, unspecified: Secondary | ICD-10-CM

## 2013-06-27 DIAGNOSIS — K859 Acute pancreatitis without necrosis or infection, unspecified: Secondary | ICD-10-CM

## 2013-06-27 LAB — COMPREHENSIVE METABOLIC PANEL
ALK PHOS: 97 U/L (ref 39–117)
ALT: 10 U/L (ref 0–53)
AST: 13 U/L (ref 0–37)
Albumin: 3.4 g/dL — ABNORMAL LOW (ref 3.5–5.2)
BUN: 10 mg/dL (ref 6–23)
CO2: 26 mEq/L (ref 19–32)
CREATININE: 1.16 mg/dL (ref 0.50–1.35)
Calcium: 9.2 mg/dL (ref 8.4–10.5)
Chloride: 94 mEq/L — ABNORMAL LOW (ref 96–112)
Glucose, Bld: 182 mg/dL — ABNORMAL HIGH (ref 70–99)
Potassium: 4.8 mEq/L (ref 3.5–5.3)
Sodium: 131 mEq/L — ABNORMAL LOW (ref 135–145)
Total Bilirubin: 0.8 mg/dL (ref 0.2–1.2)
Total Protein: 7.3 g/dL (ref 6.0–8.3)

## 2013-06-27 LAB — CBC WITH DIFFERENTIAL/PLATELET
BASOS PCT: 0 % (ref 0–1)
Basophils Absolute: 0 10*3/uL (ref 0.0–0.1)
Eosinophils Absolute: 0.1 10*3/uL (ref 0.0–0.7)
Eosinophils Relative: 1 % (ref 0–5)
HEMATOCRIT: 35.7 % — AB (ref 39.0–52.0)
Hemoglobin: 11.7 g/dL — ABNORMAL LOW (ref 13.0–17.0)
Lymphocytes Relative: 28 % (ref 12–46)
Lymphs Abs: 4.2 10*3/uL — ABNORMAL HIGH (ref 0.7–4.0)
MCH: 28.7 pg (ref 26.0–34.0)
MCHC: 32.8 g/dL (ref 30.0–36.0)
MCV: 87.5 fL (ref 78.0–100.0)
MONO ABS: 1.2 10*3/uL — AB (ref 0.1–1.0)
Monocytes Relative: 8 % (ref 3–12)
Neutro Abs: 9.4 10*3/uL — ABNORMAL HIGH (ref 1.7–7.7)
Neutrophils Relative %: 63 % (ref 43–77)
Platelets: 412 10*3/uL — ABNORMAL HIGH (ref 150–400)
RBC: 4.08 MIL/uL — ABNORMAL LOW (ref 4.22–5.81)
RDW: 14.4 % (ref 11.5–15.5)
WBC: 14.9 10*3/uL — ABNORMAL HIGH (ref 4.0–10.5)

## 2013-06-27 NOTE — Progress Notes (Signed)
Subjective:     Patient ID: Nicolas Chandler, male   DOB: 06/25/1965, 48 y.o.   MRN: [id number]  HPI Still is losing weight.  No appetite.  Constant nausea. He has lost an additional 6 pounds since his last visit.  Review of Systems No fevers or chills. He does have occasional shakes and maybe chills but no fevers. He has minimal to no abdominal pain. He looks pale    Objective:   Physical Exam Abdominal exam: Soft, flat, and nontender. His operative incisions are healed well with no evidence of infection. He has normal bowel sounds  Skin color is pale but he has normal skin turgor.    Assessment:     The patient appears to be malnourished secondary to inability to eat because of severe chronic necrotizing pancreatitis. The patient is not septic. However, because of his chronic and likely severe malnutrition he needs supplemental nutrition.  He has not been seen by a gastroenterologist since his discharge.     Plan:     The patient needs repeat laboratory studies today including a CBC, a seen at, a Cmet, and a prealbumin level. All like to talk with the gastroenterologist about plans to perhaps admit this patient for placement of a PICC line, starting TPN. In perhaps a percutaneous drain of his necrotizing pancreatitis.     Marta LamasJames O. Gae BonWyatt, III, MD, FACS [phone]--pager [phone]--office [facility]

## 2013-06-28 LAB — PREALBUMIN: Prealbumin: 15.4 mg/dL — ABNORMAL LOW (ref 17.0–34.0)

## 2013-07-04 ENCOUNTER — Encounter (INDEPENDENT_AMBULATORY_CARE_PROVIDER_SITE_OTHER): Payer: Self-pay | Admitting: General Surgery

## 2013-07-04 ENCOUNTER — Ambulatory Visit (INDEPENDENT_AMBULATORY_CARE_PROVIDER_SITE_OTHER): Payer: BC Managed Care – PPO | Admitting: General Surgery

## 2013-07-04 VITALS — HR 74 | Resp 16 | Ht 66.0 in | Wt 128.4 lb

## 2013-07-04 DIAGNOSIS — K8591 Acute pancreatitis with uninfected necrosis, unspecified: Secondary | ICD-10-CM

## 2013-07-04 DIAGNOSIS — K859 Acute pancreatitis without necrosis or infection, unspecified: Secondary | ICD-10-CM

## 2013-07-04 MED ORDER — PROMETHAZINE HCL 12.5 MG PO TABS: 12.5000 mg | ORAL_TABLET | Freq: Four times a day (QID) | ORAL | Status: DC | PRN

## 2013-07-04 NOTE — Progress Notes (Signed)
Subjective:     Patient ID: Nicolas Chandler, male   DOB: 12/12/1965, 48 y.o.   MRN: [id number]  HPI Patient is feeling better and is improving. His appetite is improved. His nausea is markedly decreased. He has gained 2 pounds since his last visit here. He's had no fevers or chills there he has no abdominal pain.  Review of Systems No fevers, chills, bleeding per rectum, vomiting, with an improving nausea .    Objective:   Physical Exam Abdominal exam: Soft, less distended abdomen with good bowel sounds and no tenderness.  General: His color is good. He is awake and alert and less drowsy and fatigued    Assessment:     Improving with current management of his necrotizing pancreatitis. The patient needs a primary care physician. [place] and he wants to get a local primary care physician.     Plan:     Continue the current management see the patient back in 2 weeks. He has been advised that if he should have any significant fevers, bleeding per rectum or hematemesis, increase in abdominal pain or jaundice he should contact me immediately. On staying in close contact with this patient because his necrotizing pancreatitis seems much worse on radiologic studies and he does look clinically. As long as he continues to gain weight and is able to eat with the current management I think he will improve without needing to be hospitalized were drained.  Nicolas Chandler, III, MD, FACS [phone]--pager [phone]--office [facility]

## 2013-07-05 ENCOUNTER — Encounter (INDEPENDENT_AMBULATORY_CARE_PROVIDER_SITE_OTHER): Payer: Self-pay

## 2013-07-17 ENCOUNTER — Ambulatory Visit (INDEPENDENT_AMBULATORY_CARE_PROVIDER_SITE_OTHER): Payer: BC Managed Care – PPO | Admitting: Internal Medicine

## 2013-07-17 ENCOUNTER — Encounter: Payer: Self-pay | Admitting: Internal Medicine

## 2013-07-17 VITALS — BP 100/78 | HR 84 | Temp 98.5°F | Ht 67.0 in | Wt 128.5 lb

## 2013-07-17 DIAGNOSIS — Z862 Personal history of diseases of the blood and blood-forming organs and certain disorders involving the immune mechanism: Secondary | ICD-10-CM

## 2013-07-17 DIAGNOSIS — K859 Acute pancreatitis without necrosis or infection, unspecified: Secondary | ICD-10-CM

## 2013-07-17 DIAGNOSIS — K851 Biliary acute pancreatitis without necrosis or infection: Secondary | ICD-10-CM

## 2013-07-17 DIAGNOSIS — E049 Nontoxic goiter, unspecified: Secondary | ICD-10-CM

## 2013-07-17 DIAGNOSIS — Z8679 Personal history of other diseases of the circulatory system: Secondary | ICD-10-CM

## 2013-07-17 DIAGNOSIS — E119 Type 2 diabetes mellitus without complications: Secondary | ICD-10-CM

## 2013-07-17 DIAGNOSIS — Z8639 Personal history of other endocrine, nutritional and metabolic disease: Secondary | ICD-10-CM

## 2013-07-17 LAB — GLUCOSE, POCT (MANUAL RESULT ENTRY)

## 2013-07-18 ENCOUNTER — Ambulatory Visit (INDEPENDENT_AMBULATORY_CARE_PROVIDER_SITE_OTHER): Payer: BC Managed Care – PPO | Admitting: General Surgery

## 2013-07-18 ENCOUNTER — Encounter (INDEPENDENT_AMBULATORY_CARE_PROVIDER_SITE_OTHER): Payer: Self-pay | Admitting: General Surgery

## 2013-07-18 VITALS — BP 90/62 | HR 76 | Resp 14 | Ht 66.0 in | Wt 127.4 lb

## 2013-07-18 DIAGNOSIS — K859 Acute pancreatitis without necrosis or infection, unspecified: Secondary | ICD-10-CM

## 2013-07-18 DIAGNOSIS — K8591 Acute pancreatitis with uninfected necrosis, unspecified: Secondary | ICD-10-CM

## 2013-07-18 LAB — MICROALBUMIN, URINE: Microalb, Ur: 1.15 mg/dL (ref 0.00–1.89)

## 2013-07-18 NOTE — Progress Notes (Signed)
Subjective:     Patient ID: Nicolas Chandler, male   DOB: 06/10/1965, 48 y.o.   MRN: [id number]  HPI The patient comes in today seemingly doing much better and also with increased weight gain. He is eating much better taking down more food at each setting. He is still eating more frequently than usual.  He has at least one bowel movement per day which is normal consistency and color. He is taking a pancreatic supplement.  He is urinating normally with normal colored urine. He has no fevers or chills. He still has some slight nausea but it is improved.  Review of Systems See the history of present illness.     Objective:   Physical Exam Abdominal examination: Soft, flat, with normal active bowel sounds. There is minimal to no tenderness. There did not appear to be a palpable mass.    Assessment:     To my mistake the patient has not a recent diabetic based on his acute pancreatitis. He was a diabetic several years before this most recent episode of severe pancreatitis. His new primary care physician has ordered some followup laboratories on this patient.       Plan:     I will see the patient back in my clinic in one month. The [place] at the end of [place]. I will see the patient prior to that scheduled vacation and decide then if a CT scan of the abdomen and pelvis will be necessary. Currently I do not believe the patient needs a CT scan since he is doing so well clinically. He has been advised to contact me as soon as possible if there is any significant change in his clinical status.  I've talked to Dr. Beryle QuantBaxley's office about adding on some laboratory studies for this patient including a CBC and a prealbumin level. They agreed to do so. They're already checking a lipid panel, a fasting glucose level, and a Cmet..Marland Kitchen

## 2013-07-21 ENCOUNTER — Other Ambulatory Visit (INDEPENDENT_AMBULATORY_CARE_PROVIDER_SITE_OTHER): Payer: BC Managed Care – PPO | Admitting: Internal Medicine

## 2013-07-21 DIAGNOSIS — Z23 Encounter for immunization: Secondary | ICD-10-CM

## 2013-07-21 DIAGNOSIS — E119 Type 2 diabetes mellitus without complications: Secondary | ICD-10-CM

## 2013-07-21 DIAGNOSIS — E785 Hyperlipidemia, unspecified: Secondary | ICD-10-CM

## 2013-07-21 DIAGNOSIS — Z79899 Other long term (current) drug therapy: Secondary | ICD-10-CM

## 2013-07-21 DIAGNOSIS — Z1329 Encounter for screening for other suspected endocrine disorder: Secondary | ICD-10-CM

## 2013-07-21 DIAGNOSIS — Z130 Encounter for screening for diseases of the blood and blood-forming organs and certain disorders involving the immune mechanism: Secondary | ICD-10-CM

## 2013-07-21 LAB — LIPID PANEL
Cholesterol: 131 mg/dL (ref 0–200)
HDL: 35 mg/dL — ABNORMAL LOW (ref >39)
LDL CALC: 66 mg/dL (ref 0–99)
TRIGLYCERIDES: 152 mg/dL — AB (ref <150)
Total CHOL/HDL Ratio: 3.7 Ratio
VLDL: 30 mg/dL (ref 0–40)

## 2013-07-21 LAB — COMPREHENSIVE METABOLIC PANEL
ALBUMIN: 4 g/dL (ref 3.5–5.2)
ALT: 15 U/L (ref 0–53)
AST: 16 U/L (ref 0–37)
Alkaline Phosphatase: 76 U/L (ref 39–117)
BUN: 9 mg/dL (ref 6–23)
CALCIUM: 9.5 mg/dL (ref 8.4–10.5)
CHLORIDE: 104 meq/L (ref 96–112)
CO2: 27 mEq/L (ref 19–32)
Creat: 0.91 mg/dL (ref 0.50–1.35)
Glucose, Bld: 98 mg/dL (ref 70–99)
POTASSIUM: 5.1 meq/L (ref 3.5–5.3)
Sodium: 138 mEq/L (ref 135–145)
TOTAL PROTEIN: 7.4 g/dL (ref 6.0–8.3)
Total Bilirubin: 1 mg/dL (ref 0.2–1.2)

## 2013-07-21 LAB — CBC WITH DIFFERENTIAL/PLATELET
BASOS ABS: 0 10*3/uL (ref 0.0–0.1)
Basophils Relative: 0 % (ref 0–1)
Eosinophils Absolute: 0.2 10*3/uL (ref 0.0–0.7)
Eosinophils Relative: 2 % (ref 0–5)
HEMATOCRIT: 37.1 % — AB (ref 39.0–52.0)
HEMOGLOBIN: 12 g/dL — AB (ref 13.0–17.0)
Lymphocytes Relative: 41 % (ref 12–46)
Lymphs Abs: 3.6 10*3/uL (ref 0.7–4.0)
MCH: 28.4 pg (ref 26.0–34.0)
MCHC: 32.3 g/dL (ref 30.0–36.0)
MCV: 87.7 fL (ref 78.0–100.0)
MONO ABS: 0.4 10*3/uL (ref 0.1–1.0)
Monocytes Relative: 5 % (ref 3–12)
NEUTROS ABS: 4.6 10*3/uL (ref 1.7–7.7)
Neutrophils Relative %: 52 % (ref 43–77)
Platelets: 224 10*3/uL (ref 150–400)
RBC: 4.23 MIL/uL (ref 4.22–5.81)
RDW: 15.9 % — AB (ref 11.5–15.5)
WBC: 8.9 10*3/uL (ref 4.0–10.5)

## 2013-07-21 LAB — TSH: TSH: 0.263 u[IU]/mL — AB (ref 0.350–4.500)

## 2013-07-21 LAB — HEMOGLOBIN A1C
Hgb A1c MFr Bld: 6.3 % — ABNORMAL HIGH (ref <5.7)
Mean Plasma Glucose: 134 mg/dL — ABNORMAL HIGH (ref <117)

## 2013-07-21 LAB — PREALBUMIN: Prealbumin: 30.5 mg/dL (ref 17.0–34.0)

## 2013-07-21 LAB — T4, FREE: FREE T4: 1.08 ng/dL (ref 0.80–1.80)

## 2013-07-21 MED ORDER — PNEUMOCOCCAL VAC POLYVALENT 25 MCG/0.5ML IJ INJ: 0.5000 mL | INJECTION | INTRAMUSCULAR | Status: DC

## 2013-07-21 NOTE — Patient Instructions (Signed)
Lab work drawn and pending. Continue to monitor accuchecks. Pneumovax given today. Lab  Results need to be faxed to Dr. Lindie SpruceWyatt

## 2013-07-21 NOTE — Progress Notes (Signed)
   Subjective:    Patient ID: Nicolas Chandler, male    DOB: 02/22/1966, 48 y.o.   MRN: [id number]  HPI Nurse visit for pneumovax vaccine. Phlebotomist drew fasting labs today. Results to be faxed to Dr. Lindie SpruceWyatt.   Review of Systems     Objective:   Physical Exam        Assessment & Plan:

## 2013-07-28 ENCOUNTER — Encounter: Payer: Self-pay | Admitting: Internal Medicine

## 2013-07-28 ENCOUNTER — Ambulatory Visit (INDEPENDENT_AMBULATORY_CARE_PROVIDER_SITE_OTHER): Payer: BC Managed Care – PPO | Admitting: Internal Medicine

## 2013-07-28 VITALS — BP 100/76 | HR 92 | Temp 98.1°F | Wt 132.5 lb

## 2013-07-28 DIAGNOSIS — Z87448 Personal history of other diseases of urinary system: Secondary | ICD-10-CM

## 2013-07-28 DIAGNOSIS — Z862 Personal history of diseases of the blood and blood-forming organs and certain disorders involving the immune mechanism: Secondary | ICD-10-CM

## 2013-07-28 DIAGNOSIS — Z8679 Personal history of other diseases of the circulatory system: Secondary | ICD-10-CM

## 2013-07-28 DIAGNOSIS — E119 Type 2 diabetes mellitus without complications: Secondary | ICD-10-CM

## 2013-07-28 DIAGNOSIS — E049 Nontoxic goiter, unspecified: Secondary | ICD-10-CM

## 2013-07-28 DIAGNOSIS — Z8639 Personal history of other endocrine, nutritional and metabolic disease: Secondary | ICD-10-CM

## 2013-07-28 DIAGNOSIS — R634 Abnormal weight loss: Secondary | ICD-10-CM

## 2013-07-29 NOTE — Progress Notes (Signed)
Subjective:    Patient ID: Nicolas Chandler, male    DOB: 01-16-1966, 48 y.o.   MRN: [id number]  HPI  We have received records for Nicolas Chandler from [facility]- Dr. Reyes Ivan- [place]ling just recently.    He has been maintained on Amaryl 4 mg daily, lisinopril 5 mg daily, metformin, Zocor 20 mg daily. At this time, he just on Amaryl for his diabetes. Zocor has been held due to pancreatitis issues. Blood pressure has been low since discharge but I suspect lisinopril was for renal protection with history of diabetes in addition to hypertension.  He has a history of hypertension, type 2 diabetes mellitus and hyperlipidemia  He has a history of fatty liver disease and goiter. Patient says he has had a goiter since childhood. In 2011, he had ultrasound of his thyroid gland showing hypoechoic nodules on the right.  His diabetic control has varied over the past few years. In August 2010, A1c was 6.9%. In February 2010 A1c was 7%. In January 2010 A1c was 8.1%. In November 2011, A1c was 7.2%. In May 2012 A1c was 8.3%. In March 2011, A1c was 9%. He's also developed some mild chronic kidney disease. Creatinine was 1.34 in February 2011. Highest and creatinine was 1.77 in April 2011.   Apparently received BCG vaccine  as a child. 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 Subsequently was found to have a 1.8 cm jugular chain lymph node plus abscess with fluid and gas between the submandibular gland and the sternocleidomastoid muscle on the right and a questionable 3 mm stone in the right submandibular duct. It was felt he did not have tuberculosis of the lymph nodes. It was felt that he had salivary gland duct with abscess and was treated with Augmentin and improved.  Subsequently he was placed on INH and pyridoxine for 9 months beginning in January 2010  Was diagnosed with choledocholithiasis in 2010 .   He does not smoke. Came to the [place] around 2008. Initially worked in  [employer] and more recently [employer]. Married with 2 daughters. Occasional alcohol consumption before recent illness.  No known drug allergies.  Was tested for [name] virus in 2011 and was found to have old infection.  Had ultrasound of the abdomen August 2014 showing fatty liver, gallstones, gallbladder wall thickening and common bile duct dilatation. Pancreas was unremarkable. Thought possibly to have small right renal stone at that time.  Has also been on Actos, Crestor and glipizide in the past. Difficulty affording Crestor. Started Amaryl and 2011. Mild elevation of SGOT of 41 and SGPT of 63 in may 2012 TSH normal in August 2010 in February 2011. Also urine for microalbumin was normal in January 2012.  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 History of equivocal toxoplasma gondii IgG titer of 6.5 normal being up to 6.4 and positive greater than 7.9.  He did see a nephrologist in [place],Dr. Ladell Pier in May 2011. He recommended patient keep tight control of diabetes and be on an ACE inhibitor. Felt patient was developing chronic kidney disease due to diabetic nephropathy. Patient had indicated he developed diabetes around 2005 in [place]. It was nephrologist's  impression that patient probably had diabetes for a lot longer than that.  According to old records, usual weight is about 160 pounds.  Review of Systems  Objective:   Physical Exam  Brings in multiple Accu-Chek readings all of which are very acceptable. Thyroid is enlarged and symmetrical. Blood pressure is stable. He remains off lisinopril but probably should restart it due to diabetes and history of renal insufficiency.  Diabetic foot exam reveals no calluses or ulcers and pulses are normal. Chest clear. Cardiac exam regular rate and rhythm.      Assessment & Plan:  Controlled type 2  diabetes mellitus  History of hypertension-blood pressure stable off lisinopril but probably should restart it due to diabetes and renal protection due to chronic kidney disease  History of chronic kidney disease secondary to diabetes  Status post cholecystectomy and biliary pancreatitis  Goiter-to have repeat thyroid ultrasound. Last one was done several years ago  Weight loss from illness-usual weight approximately 160 pounds according to old records  Hyperlipidemia-stay off of Zocor for now. Recheck in 3 months.  Plan: Return in 3 months for office visit hemoglobin A1c, lipid panel, with blood pressure check. Reminded about diabetic eye exam yearly. Goal is to keep blood pressure less than 130/80, hemoglobin A1c less than 7 and LDL less than 100. Stay off of Zocor for now.

## 2013-07-29 NOTE — Progress Notes (Signed)
   Subjective:    Patient ID: Nicolas Chandler, male    DOB: 04/26/1966, 48 y.o.   MRN: [id number]  HPI  48 year old [place] male presents the office for the first time today at the request of Dr. Lindie SpruceWyatt. Patient has history of type 2 diabetes mellitus, hypertension, hyperlipidemia. Apparently has been in the [place] proximally [place] years. Apparently has had diabetes for about 10 years. Long-standing history of goiter since childhood.  Social history: Does not smoke or consume alcohol at the present time. Has Masters degree and is married with 2 daughters ages 1516 and 3511. Formerly in [employer] business. Currently not working.  Family history: Father age 46 with history of hypertension, thyroid disorder status post coronary artery bypass surgery. Mother age 46 with status post thyroidectomy. Brother age 46 with history of overactive thyroid. Father with history of kidney stones. Paternal uncle with history of diabetes.  Patient underwent surgery by Dr. Lindie SpruceWyatt in December 2014 . Patient had laparoscopic cholecystectomy. Also had severe pancreatitis related to gallstones. Patient has been on a bland diet.   Since discharge, patient has been following a strict diabetic diet. Says glucoses been under excellent control. Says he [place]. Currently just taking Amaryl. History of hyperlipidemia for which he has been taking Zocor 20 mg daily. History of hypertension treated with lisinopril.  No known drug allergies  Vaccinated with BCG as a child.  [place] he had left rotator cuff tear based on radiology study. These records are not available. This was treated conservatively.    Review of Systems appetite improving. Says he is watching Accu-Cheks and they have been excellent. Wife accompanies him today. Some difficulty communicating with patient because of language  barrier. Wife is of great assistance.     Objective:   Physical Exam  Skin warm and dry. Nodes none. Has large goiter which is symmetrical. Chest clear. Cardiac exam regular rate and rhythm normal S1 and S2. Extremities without edema. Diabetic foot exam without ulcers or calluses. Pulses are normal in feet.      Assessment & Plan:  Type 2 diabetes mellitus  Biliary pancreatitis  History of hypertension  History of hyperlipidemia  Plan: [place] [facility]. Return in 10 days with Accu-Chek readings. Pneumovax immunization given. Labs pending.

## 2013-07-29 NOTE — Patient Instructions (Signed)
Return in 10 days with Accu-Chek readings. Pneumovax immunization given. Labs are pending.

## 2013-07-31 NOTE — Patient Instructions (Addendum)
Scheduled for ultrasound of neck /thyroid on Wed 08/02/2013 at 9:45am. Patient aware. Restart lisinopril. Hold Zocor. Return in 3 months.

## 2013-08-02 ENCOUNTER — Telehealth: Payer: Self-pay

## 2013-08-02 ENCOUNTER — Ambulatory Visit
Admission: RE | Admit: 2013-08-02 | Discharge: 2013-08-02 | Disposition: A | Discharge status: Home / Self Care | Payer: BC Managed Care – PPO | Source: Ambulatory Visit | Attending: Internal Medicine | Admitting: Internal Medicine

## 2013-08-02 NOTE — Progress Notes (Signed)
Patient informed. 

## 2013-08-08 ENCOUNTER — Encounter (INDEPENDENT_AMBULATORY_CARE_PROVIDER_SITE_OTHER): Payer: Self-pay | Admitting: General Surgery

## 2013-08-08 ENCOUNTER — Ambulatory Visit (INDEPENDENT_AMBULATORY_CARE_PROVIDER_SITE_OTHER): Payer: BC Managed Care – PPO | Admitting: General Surgery

## 2013-08-08 VITALS — BP 124/74 | HR 80 | Temp 97.8°F | Resp 16 | Ht 66.0 in | Wt 134.0 lb

## 2013-08-08 DIAGNOSIS — K859 Acute pancreatitis without necrosis or infection, unspecified: Secondary | ICD-10-CM

## 2013-08-08 DIAGNOSIS — K8591 Acute pancreatitis with uninfected necrosis, unspecified: Secondary | ICD-10-CM

## 2013-08-08 NOTE — Progress Notes (Signed)
Subjective:     Patient ID: Nicolas Chandler, male   DOB: 10/11/1965, 48 y.o.   MRN: [id number]  HPI The patient is doing well. He has gained about 5 pounds since his last visit and his appetite has significantly improved. He has had no fevers or chills. And he only still has a mild amount of abdominal discomfort.  Review of Systems No fevers, chills, and only mild nausea and no vomiting.    Objective:   Physical Exam On examination the patient has no palpable masses. He is minimally tender in the left upper quadrant and the epigastrium.    Assessment:      All of the surgical incisions have healed.  Necrotizing pancreatitis which clinically seems to be markedly improved.     Plan:     The patient has not had a CT scan of the abdomen since January. Although he is clinically much improved followup radiologic studies I believe would be helpful to rule out pseudocyst formation. The patient and his family are planning on going to [place] in approximately 3 weeks. We will get a CAT scan prior to his to his departure..Marland Kitchen

## 2013-08-09 ENCOUNTER — Telehealth (INDEPENDENT_AMBULATORY_CARE_PROVIDER_SITE_OTHER): Payer: Self-pay | Admitting: *Deleted

## 2013-08-09 NOTE — Telephone Encounter (Signed)
I spoke with pt and informed him of the appt for his CT scan at [facility] on 08/14/13 with an arrival time of 11:45am.  I instructed him on when to drink his contrast and not to have any solid foods after 8:00am.  He is agreeable with all information provided.

## 2013-08-14 ENCOUNTER — Ambulatory Visit
Admission: RE | Admit: 2013-08-14 | Discharge: 2013-08-14 | Disposition: A | Discharge status: Home / Self Care | Payer: BC Managed Care – PPO | Source: Ambulatory Visit | Attending: General Surgery | Admitting: General Surgery

## 2013-08-14 ENCOUNTER — Telehealth: Payer: Self-pay

## 2013-08-14 DIAGNOSIS — K8591 Acute pancreatitis with uninfected necrosis, unspecified: Secondary | ICD-10-CM

## 2013-08-14 DIAGNOSIS — E049 Nontoxic goiter, unspecified: Secondary | ICD-10-CM

## 2013-08-14 MED ORDER — IOHEXOL 300 MG/ML  SOLN
100.0000 mL | Freq: Once | INTRAMUSCULAR | Status: AC | PRN
  Administered 2013-08-14: 100 mL via INTRAVENOUS

## 2013-08-14 NOTE — Telephone Encounter (Signed)
Patient informed of appointment with Dr. Everardo AllEllison on 08/17/2013 at 9:45 am.

## 2013-08-16 ENCOUNTER — Encounter (INDEPENDENT_AMBULATORY_CARE_PROVIDER_SITE_OTHER): Payer: Self-pay

## 2013-08-17 ENCOUNTER — Encounter: Payer: Self-pay | Admitting: Endocrinology

## 2013-08-17 ENCOUNTER — Ambulatory Visit (INDEPENDENT_AMBULATORY_CARE_PROVIDER_SITE_OTHER): Payer: BC Managed Care – PPO | Admitting: Endocrinology

## 2013-08-17 VITALS — BP 108/72 | HR 101 | Temp 98.4°F | Ht 66.0 in | Wt 136.0 lb

## 2013-08-17 DIAGNOSIS — E0590 Thyrotoxicosis, unspecified without thyrotoxic crisis or storm: Secondary | ICD-10-CM

## 2013-08-17 NOTE — Progress Notes (Signed)
Subjective:    Patient ID: Nicolas Chandler, male    DOB: 09/26/1965, 48 y.o.   MRN: [id number]  HPI Pt says he was noted in 2011 to have a goiter.  He has a slight sensation of swelling at the anterior neck, but no assoc pain. Past Medical History  Diagnosis Date  . Diabetes mellitus without complication   . Hypertension   . Cholecystitis 04/2013  . Fatty liver   . Pancreatitis   . Goiter     Dr. [facility] workup including biopsy of thyroid which was normal; brother has Graves and mother has thyroid disease    Past Surgical History  Procedure Laterality Date  . Ercp N/A 05/10/2013    Procedure: ENDOSCOPIC RETROGRADE CHOLANGIOPANCREATOGRAPHY (ERCP);  Surgeon: Theda BelfastPatrick D Hung, MD;  [facility];  Service: Endoscopy;  Laterality: N/A;  . Laparoscopic cholecystectomy  05/17/13    Dr. Lindie SpruceWyatt  . Cholecystectomy N/A 05/17/2013    Procedure: LAPAROSCOPIC CHOLECYSTECTOMY WITH INTRAOPERATIVE CHOLANGIOGRAM;  Surgeon: Cherylynn RidgesJames O Wyatt, MD;  Location: [facility] OR;  Service: General;  Laterality: N/A;    History   Social History  . Marital Status: Married    Spouse Name: N/A    Number of Children: N/A  . Years of Education: N/A   Occupational History  . Not on file.   Social History Main Topics  . Smoking status: Never Smoker   . Smokeless tobacco: Never Used  . Alcohol Use: No  . Drug Use: No  . Sexual Activity: Not on file   Other Topics Concern  . Not on file   Social History Narrative  . No narrative on file    Current Outpatient Prescriptions on File Prior to Visit  Medication Sig Dispense Refill  . glimepiride (AMARYL) 4 MG tablet Take 4 mg by mouth 2 (two) times daily.      Marland Kitchen. lisinopril (PRINIVIL,ZESTRIL) 5 MG tablet Take 5 mg by mouth daily.       Current Facility-Administered Medications on File Prior to Visit  Medication Dose Route Frequency Provider Last Rate Last Dose  . pneumococcal 23 valent vaccine (PNU-IMMUNE) injection 0.5 mL  0.5 mL  Intramuscular Tomorrow-1000 Margaree MackintoshMary J Baxley, MD        No Known Allergies  Family History  Problem Relation Age of Onset  . Thyroid disease Mother     had thyroidectomy  . Thyroid disease Father     Has low thyroid in 2014  . Thyroid disease Brother     Had radioactive iodine treatment   BP 108/72  Pulse 101  Temp(Src) 98.4 F (36.9 C) (Oral)  Ht 5\' 6"  (1.676 m)  Wt 136 lb (61.689 kg)  BMI 21.96 kg/m2  SpO2 94%  Review of Systems denies weight loss, headache, hoarseness, double vision, palpitations, sob, diarrhea, polyuria, myalgias, excessive diaphoresis, numbness, tremor, anxiety, heat intolerance, and easy bruising. He reports rhinorrhea.      Objective:   Physical Exam VS: see vs page GEN: no distress HEAD: head: no deformity eyes: no periorbital swelling, no proptosis external nose and ears are normal mouth: no lesion seen NECK: little if any enlargement of the thyroid.  i cannot appreciate a nodule. CHEST WALL: no deformity LUNGS: clear to auscultation BREASTS:  No gynecomastia CV: reg rate and rhythm, no murmur ABD: abdomen is soft, nontender.  no hepatosplenomegaly.  not distended.  no hernia MUSCULOSKELETAL: muscle bulk and strength are grossly normal.  no obvious joint swelling.  gait is normal and steady EXTEMITIES:  no deformity.  no ulcer on the feet.  feet are of normal color and temp.  no edema PULSES: dorsalis pedis intact bilat.  no carotid bruit NEURO:  cn 2-12 grossly intact.   readily moves all 4's.  sensation is intact to touch on the feet SKIN:  Normal texture and temperature.  No rash or suspicious lesion is visible.   NODES:  None palpable at the neck PSYCH: alert, well-oriented.  Does not appear anxious nor depressed.  Lab Results  Component Value Date   TSH 0.263* 07/21/2013      Assessment & Plan:  Small multinodular goiter, hereditary. Hyperthyroidism, due to the above. Tachycardia, borderline.  Despite this, he can consider I-131 rx, as  he is otherwise clinically well, and TSH abnormality is mild.

## 2013-08-17 NOTE — Patient Instructions (Addendum)
Please consider the treatment options was discussed, and let us know. If you chose the radioactive iodine treatment, it would work like this: we would first check a thyroid "scan" (a special, but easy and painless type of thyroid x ray).  It works like this: you go to the x-ray department of the hospital to swallow a pill, which contains a miniscule amount of radiation.  You will not notice any symptoms from this.  You will go back to the x-ray department the next day, to lie down in front of a camera.  The results of this will be sent to me.   Based on the results, i hope to order for you a treatment pill of radioactive iodine.  Although it is a larger amount of radiation, you will again notice no symptoms from this.  The pill is gone from your body in a few days (during which you should stay away from other people), but takes several months to work.  Therefore, please return here approximately 6-8 weeks after the treatment.  This treatment has been available for many years, and the only known side-effect is an underactive thyroid.  It is possible that i would eventually prescribe for you a thyroid hormone pill, which is very inexpensive.  You don't have to worry about side-effects of this thyroid hormone pill, because it is the same molecule your thyroid makes.

## 2013-09-05 ENCOUNTER — Encounter (INDEPENDENT_AMBULATORY_CARE_PROVIDER_SITE_OTHER): Payer: Self-pay | Admitting: General Surgery

## 2013-09-05 ENCOUNTER — Ambulatory Visit (INDEPENDENT_AMBULATORY_CARE_PROVIDER_SITE_OTHER): Payer: BC Managed Care – PPO | Admitting: General Surgery

## 2013-09-05 VITALS — BP 126/80 | HR 80 | Temp 97.8°F | Resp 14 | Ht 66.0 in | Wt 136.6 lb

## 2013-09-05 DIAGNOSIS — K862 Cyst of pancreas: Secondary | ICD-10-CM

## 2013-09-05 DIAGNOSIS — K863 Pseudocyst of pancreas: Secondary | ICD-10-CM

## 2013-09-05 NOTE — Progress Notes (Signed)
Subjective:     Patient ID: Nicolas SabinsJinsup Chandler, male   DOB: 02/03/1966, 48 y.o.   MRN: [id number]  HPI The patient is doing really well. He has no fevers or chills, no nausea vomiting. He is eating normally.  Review of Systems As above    Objective:   Physical Exam On examination the patient is doing very well. He has no abdominal pain. There are no scars. I have reviewed his CAT scan with the family and with the exception of a couple small pseudocyst on the midportion and the tail of his pancreas looks exceedingly well.    Assessment:     Healed acute necrotizing pancreatitis doing well with small pseudocyst.     Plan:     The patient's last visit will be in 3 months. At that time I will repeat his CT scan looking for the evolution of a pseudocyst.

## 2013-10-30 ENCOUNTER — Other Ambulatory Visit: Payer: Self-pay

## 2013-10-30 MED ORDER — LISINOPRIL 5 MG PO TABS: 5.0000 mg | ORAL_TABLET | Freq: Every day | ORAL | Status: DC

## 2013-11-30 ENCOUNTER — Other Ambulatory Visit: Payer: BC Managed Care – PPO | Admitting: Internal Medicine

## 2013-11-30 DIAGNOSIS — E119 Type 2 diabetes mellitus without complications: Secondary | ICD-10-CM

## 2013-11-30 DIAGNOSIS — I10 Essential (primary) hypertension: Secondary | ICD-10-CM

## 2013-11-30 DIAGNOSIS — Z79899 Other long term (current) drug therapy: Secondary | ICD-10-CM

## 2013-11-30 DIAGNOSIS — E785 Hyperlipidemia, unspecified: Secondary | ICD-10-CM

## 2013-11-30 LAB — HEPATIC FUNCTION PANEL
ALBUMIN: 4.5 g/dL (ref 3.5–5.2)
ALT: 20 U/L (ref 0–53)
AST: 17 U/L (ref 0–37)
Alkaline Phosphatase: 82 U/L (ref 39–117)
BILIRUBIN INDIRECT: 1.5 mg/dL — AB (ref 0.2–1.2)
Bilirubin, Direct: 0.2 mg/dL (ref 0.0–0.3)
TOTAL PROTEIN: 7.2 g/dL (ref 6.0–8.3)
Total Bilirubin: 1.7 mg/dL — ABNORMAL HIGH (ref 0.2–1.2)

## 2013-11-30 LAB — LIPID PANEL
CHOLESTEROL: 176 mg/dL (ref 0–200)
HDL: 43 mg/dL (ref >39)
LDL CALC: 100 mg/dL — AB (ref 0–99)
TRIGLYCERIDES: 164 mg/dL — AB (ref <150)
Total CHOL/HDL Ratio: 4.1 Ratio
VLDL: 33 mg/dL (ref 0–40)

## 2013-11-30 LAB — BASIC METABOLIC PANEL
BUN: 13 mg/dL (ref 6–23)
CALCIUM: 9.5 mg/dL (ref 8.4–10.5)
CO2: 26 meq/L (ref 19–32)
CREATININE: 1.08 mg/dL (ref 0.50–1.35)
Chloride: 101 mEq/L (ref 96–112)
Glucose, Bld: 166 mg/dL — ABNORMAL HIGH (ref 70–99)
Potassium: 4.7 mEq/L (ref 3.5–5.3)
Sodium: 137 mEq/L (ref 135–145)

## 2013-11-30 LAB — HEMOGLOBIN A1C
Hgb A1c MFr Bld: 8.1 % — ABNORMAL HIGH (ref <5.7)
MEAN PLASMA GLUCOSE: 186 mg/dL — AB (ref <117)

## 2013-12-01 ENCOUNTER — Ambulatory Visit: Payer: BC Managed Care – PPO | Admitting: Internal Medicine

## 2013-12-04 ENCOUNTER — Ambulatory Visit (INDEPENDENT_AMBULATORY_CARE_PROVIDER_SITE_OTHER): Payer: BC Managed Care – PPO | Admitting: Internal Medicine

## 2013-12-04 ENCOUNTER — Encounter: Payer: Self-pay | Admitting: Internal Medicine

## 2013-12-04 VITALS — BP 116/86 | HR 80 | Temp 98.9°F | Wt 143.0 lb

## 2013-12-04 DIAGNOSIS — Z8719 Personal history of other diseases of the digestive system: Secondary | ICD-10-CM

## 2013-12-04 DIAGNOSIS — Z862 Personal history of diseases of the blood and blood-forming organs and certain disorders involving the immune mechanism: Secondary | ICD-10-CM

## 2013-12-04 DIAGNOSIS — E119 Type 2 diabetes mellitus without complications: Secondary | ICD-10-CM

## 2013-12-04 DIAGNOSIS — Z8639 Personal history of other endocrine, nutritional and metabolic disease: Secondary | ICD-10-CM

## 2013-12-04 DIAGNOSIS — E0590 Thyrotoxicosis, unspecified without thyrotoxic crisis or storm: Secondary | ICD-10-CM

## 2013-12-04 MED ORDER — GLIMEPIRIDE 4 MG PO TABS: 4.0000 mg | ORAL_TABLET | Freq: Two times a day (BID) | ORAL | Status: DC

## 2013-12-04 MED ORDER — METFORMIN HCL 1000 MG PO TABS: 1000.0000 mg | ORAL_TABLET | Freq: Two times a day (BID) | ORAL | Status: DC

## 2013-12-04 NOTE — Patient Instructions (Signed)
Continue Gliperide 4mg  twice a day. Increase metformin to 1000 mg twice a day. Otherwise return in 6 weeks.

## 2013-12-14 ENCOUNTER — Encounter (INDEPENDENT_AMBULATORY_CARE_PROVIDER_SITE_OTHER): Payer: Self-pay | Admitting: General Surgery

## 2013-12-14 ENCOUNTER — Ambulatory Visit (INDEPENDENT_AMBULATORY_CARE_PROVIDER_SITE_OTHER): Payer: BC Managed Care – PPO | Admitting: General Surgery

## 2013-12-14 VITALS — BP 118/78 | HR 99 | Temp 97.0°F | Ht 66.0 in | Wt 141.0 lb

## 2013-12-14 DIAGNOSIS — K859 Acute pancreatitis without necrosis or infection, unspecified: Secondary | ICD-10-CM

## 2013-12-14 DIAGNOSIS — K8591 Acute pancreatitis with uninfected necrosis, unspecified: Secondary | ICD-10-CM

## 2013-12-14 NOTE — Progress Notes (Signed)
Subjective:     Patient ID: Nicolas Chandler, male   DOB: 06/06/1965, 48 y.o.   MRN: [id number]  HPI The patient is doing very well. No fevers or chills. No nausea vomiting.  Review of Systems No specific issues.    Objective:   Physical Exam Her wounds are healing well without infection. He has no abdominal tenderness. No palpable masses.    Assessment:     Doing well status post laparoscopic cholecystectomy with a severe episode of gallstone pancreatitis prior to surgery.     Plan:     Return to see me on an as-needed basis.

## 2014-01-12 ENCOUNTER — Other Ambulatory Visit: Payer: BC Managed Care – PPO | Admitting: Internal Medicine

## 2014-01-12 DIAGNOSIS — E119 Type 2 diabetes mellitus without complications: Secondary | ICD-10-CM

## 2014-01-13 LAB — HEMOGLOBIN A1C
HEMOGLOBIN A1C: 7.1 % — AB (ref <5.7)
Mean Plasma Glucose: 157 mg/dL — ABNORMAL HIGH (ref <117)

## 2014-01-15 ENCOUNTER — Ambulatory Visit (INDEPENDENT_AMBULATORY_CARE_PROVIDER_SITE_OTHER): Payer: BC Managed Care – PPO | Admitting: Internal Medicine

## 2014-01-15 ENCOUNTER — Encounter: Payer: Self-pay | Admitting: Internal Medicine

## 2014-01-15 VITALS — BP 116/84 | HR 84 | Wt 143.0 lb

## 2014-01-15 DIAGNOSIS — E119 Type 2 diabetes mellitus without complications: Secondary | ICD-10-CM

## 2014-01-15 NOTE — Progress Notes (Signed)
   Subjective:    Patient ID: Nicolas Chandler, male    DOB: 01-04-1966, 48 y.o.   MRN: [id number]  HPI In today to followup on diabetes. Hemoglobin A1c has improved an entire percentage point. He has been released from Dr. Dixon Boos care. Has not had an eye exam. Gave him name of ophthalmologist to see. Needs to have annual eye exam because of diabetes. I think he understands this. Reviewed with him dietary information. May eat steamed vegetables and protein. Has to watch her rice consumption and sweets.  He has seen Dr. Everardo All recently regarding hyperthyroidism. Patient chooses not to have radioactive iodine at this time.  Hemoglobin A1c is 7.1% and previously was 8.1%. Last visit Glucophage was increased. This is helped.  He is also on Amaryl. Is on low dose ACE inhibitor.    Review of Systems     Objective:   Physical Exam  Diabetic foot exam within normal limits. Chest clear. Cardiac exam regular rate and rhythm.      Assessment & Plan:    Controlled type 2 diabtes  Health maintenance- declines flu vaccine  Mild hyperthyroidism-stable  History of pancreatitis  RTC 4 months.

## 2014-01-16 NOTE — Patient Instructions (Signed)
Return in 4 months. Keep up the good work with  diet. Have diabetic eye exam.

## 2014-02-10 NOTE — Progress Notes (Signed)
   Subjective:    Patient ID: Nicolas Chandler, male    DOB: 1965-07-31, 48 y.o.   MRN: [id number]  HPI  Last seen in April for diabetic management. History of hyperlipidemia. 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 Otherwise doing okay. No recurrence of pancreatitis. No abdominal pain. Says that diabetes is fairly well controlled.    Review of Systems     Objective:   Physical Exam  Skin warm and dry. Nodes none. Chest clear. Cardiac exam regular rate and rhythm normal S1 and S2. Extremities without edema. Lipid panel is stable. However hemoglobin A1c has increased from 6.3% 8.1%.      Assessment & Plan:  Type 2 diabetes mellitus controlled with oral agents with increasing hemoglobin A1c at 8.1%. Increase metformin to 1000 mg twice daily and continue glyburide  History of pancreatitis-no recurrence  Hyperthyroidism seen by Dr. Everardo All  Hyperlipidemia-mild  Plan: Patient to watch diet more closely and return in a few weeks for followup hemoglobin A1c with increased dose of metformin.

## 2014-03-03 ENCOUNTER — Other Ambulatory Visit: Payer: Self-pay | Admitting: Internal Medicine

## 2014-04-23 ENCOUNTER — Telehealth: Payer: Self-pay

## 2014-04-23 NOTE — Telephone Encounter (Signed)
Patient called requesting an ultrasound of his kidneys.  He first said he thinks he had a kidney stone.  Offered him appointment 11/30 and 12/2 but he refused.  He then proceeded to say he was not having any problems at this time he just wanted to have an ultrasound before his next appointment to discuss results.  Advised patient if he is not having any issues at this time he does not need an ultrasound.  He has an appointment at the end of the month and can discuss issues then.  Before hanging up I asked again if he was having any issues now.  He said no and that he can just follow up at his appointment.

## 2014-04-29 ENCOUNTER — Other Ambulatory Visit: Payer: Self-pay | Admitting: Internal Medicine

## 2014-05-21 ENCOUNTER — Other Ambulatory Visit: Payer: BC Managed Care – PPO | Admitting: Internal Medicine

## 2014-05-21 DIAGNOSIS — E785 Hyperlipidemia, unspecified: Secondary | ICD-10-CM

## 2014-05-21 DIAGNOSIS — E089 Diabetes mellitus due to underlying condition without complications: Secondary | ICD-10-CM

## 2014-05-21 DIAGNOSIS — E079 Disorder of thyroid, unspecified: Secondary | ICD-10-CM

## 2014-05-21 LAB — LIPID PANEL
CHOL/HDL RATIO: 4.1 ratio
CHOLESTEROL: 156 mg/dL (ref 0–200)
HDL: 38 mg/dL — AB (ref >39)
LDL Cholesterol: 79 mg/dL (ref 0–99)
Triglycerides: 194 mg/dL — ABNORMAL HIGH (ref <150)
VLDL: 39 mg/dL (ref 0–40)

## 2014-05-21 LAB — HEMOGLOBIN A1C
HEMOGLOBIN A1C: 8.5 % — AB (ref <5.7)
MEAN PLASMA GLUCOSE: 197 mg/dL — AB (ref <117)

## 2014-05-22 ENCOUNTER — Ambulatory Visit (INDEPENDENT_AMBULATORY_CARE_PROVIDER_SITE_OTHER): Payer: BC Managed Care – PPO | Admitting: Internal Medicine

## 2014-05-22 ENCOUNTER — Encounter: Payer: Self-pay | Admitting: Internal Medicine

## 2014-05-22 VITALS — BP 112/82 | HR 109 | Temp 99.0°F | Wt 144.0 lb

## 2014-05-22 DIAGNOSIS — Z8719 Personal history of other diseases of the digestive system: Secondary | ICD-10-CM | POA: Diagnosis not present

## 2014-05-22 DIAGNOSIS — E119 Type 2 diabetes mellitus without complications: Secondary | ICD-10-CM

## 2014-05-22 DIAGNOSIS — Z8639 Personal history of other endocrine, nutritional and metabolic disease: Secondary | ICD-10-CM

## 2014-05-22 DIAGNOSIS — E781 Pure hyperglyceridemia: Secondary | ICD-10-CM

## 2014-05-22 LAB — TSH: TSH: 0.922 u[IU]/mL (ref 0.350–4.500)

## 2014-05-22 LAB — MICROALBUMIN / CREATININE URINE RATIO
CREATININE, URINE: 224.9 mg/dL
MICROALB/CREAT RATIO: 5.3 mg/g (ref 0.0–30.0)
Microalb, Ur: 1.2 mg/dL (ref <2.0)

## 2014-05-22 LAB — T4, FREE: FREE T4: 1.5 ng/dL (ref 0.80–1.80)

## 2014-06-04 ENCOUNTER — Other Ambulatory Visit: Payer: Self-pay | Admitting: Internal Medicine

## 2014-08-21 ENCOUNTER — Encounter: Payer: Self-pay | Admitting: Internal Medicine

## 2014-08-21 NOTE — Patient Instructions (Addendum)
Please watch diet and exercise. Return in 4 months for follow-up. Need to have diabetic eye exam.

## 2014-08-21 NOTE — Progress Notes (Signed)
   Subjective:    Patient ID: Nicolas Chandler, male    DOB: 02/19/1966, 49 y.o.   MRN: [id number]  HPI 49 year old Male in today to follow-up on diabetes mellitus. At last visit hemoglobin A1c had improved from 8.1% to 7.1% after increasing Glucophage dosage. However this time hemoglobin is 8.5% and previously was 7.1%. Also triglycerides of increased from 164-194. Admits to eating too many sweets. Free T4 and TSH are within normal limits. He has a history of hyperthyroidism. Has seen Dr. Everardo AllEllison in the past and does not want to have radioactive iodine. In July Glucophage was increased to 1000 mg twice daily. Denies diarrhea with that. I think he may not be taking Amaryl.    Review of Systems     Objective:   Physical Exam  Skin warm and dry. Nodes none. Neck is supple without JVD thyromegaly or carotid bruits. Chest clear to auscultation. Extremities without edema.      Assessment & Plan:  Type 2 diabetes not nearly as well controlled as it was 4 months ago. Triglycerides have also increased. Do not think he is watching his diet. May not be taking Amaryl.  Plan: Encouraged diet exercise and compliance with medications. Return in 4 months. Continue ACE inhibitor.

## 2014-09-10 ENCOUNTER — Other Ambulatory Visit: Payer: BLUE CROSS/BLUE SHIELD | Admitting: Internal Medicine

## 2014-09-10 DIAGNOSIS — Z0000 Encounter for general adult medical examination without abnormal findings: Secondary | ICD-10-CM

## 2014-09-10 DIAGNOSIS — Z13220 Encounter for screening for lipoid disorders: Secondary | ICD-10-CM

## 2014-09-10 DIAGNOSIS — E119 Type 2 diabetes mellitus without complications: Secondary | ICD-10-CM

## 2014-09-10 LAB — CBC WITH DIFFERENTIAL/PLATELET
BASOS ABS: 0 10*3/uL (ref 0.0–0.1)
BASOS PCT: 0 % (ref 0–1)
EOS ABS: 0.1 10*3/uL (ref 0.0–0.7)
EOS PCT: 2 % (ref 0–5)
HEMATOCRIT: 44.7 % (ref 39.0–52.0)
Hemoglobin: 14.8 g/dL (ref 13.0–17.0)
Lymphocytes Relative: 43 % (ref 12–46)
Lymphs Abs: 3.1 10*3/uL (ref 0.7–4.0)
MCH: 30.5 pg (ref 26.0–34.0)
MCHC: 33.1 g/dL (ref 30.0–36.0)
MCV: 92 fL (ref 78.0–100.0)
MPV: 10.3 fL (ref 8.6–12.4)
Monocytes Absolute: 0.4 10*3/uL (ref 0.1–1.0)
Monocytes Relative: 5 % (ref 3–12)
Neutro Abs: 3.7 10*3/uL (ref 1.7–7.7)
Neutrophils Relative %: 50 % (ref 43–77)
PLATELETS: 162 10*3/uL (ref 150–400)
RBC: 4.86 MIL/uL (ref 4.22–5.81)
RDW: 13.8 % (ref 11.5–15.5)
WBC: 7.3 10*3/uL (ref 4.0–10.5)

## 2014-09-10 LAB — LIPID PANEL
Cholesterol: 154 mg/dL (ref 0–200)
HDL: 42 mg/dL (ref >=40)
LDL Cholesterol: 89 mg/dL (ref 0–99)
TRIGLYCERIDES: 117 mg/dL (ref <150)
Total CHOL/HDL Ratio: 3.7 Ratio
VLDL: 23 mg/dL (ref 0–40)

## 2014-09-10 LAB — COMPLETE METABOLIC PANEL WITH GFR
ALK PHOS: 59 U/L (ref 39–117)
ALT: 14 U/L (ref 0–53)
AST: 14 U/L (ref 0–37)
Albumin: 4.5 g/dL (ref 3.5–5.2)
BUN: 20 mg/dL (ref 6–23)
CO2: 29 mEq/L (ref 19–32)
Calcium: 9.3 mg/dL (ref 8.4–10.5)
Chloride: 102 mEq/L (ref 96–112)
Creat: 1.04 mg/dL (ref 0.50–1.35)
GFR, EST NON AFRICAN AMERICAN: 85 mL/min
GFR, Est African American: >89 mL/min
Glucose, Bld: 141 mg/dL — ABNORMAL HIGH (ref 70–99)
Potassium: 4.9 mEq/L (ref 3.5–5.3)
Sodium: 140 mEq/L (ref 135–145)
TOTAL PROTEIN: 6.7 g/dL (ref 6.0–8.3)
Total Bilirubin: 1 mg/dL (ref 0.2–1.2)

## 2014-09-11 ENCOUNTER — Ambulatory Visit (INDEPENDENT_AMBULATORY_CARE_PROVIDER_SITE_OTHER): Payer: BLUE CROSS/BLUE SHIELD | Admitting: Internal Medicine

## 2014-09-11 ENCOUNTER — Encounter: Payer: Self-pay | Admitting: Internal Medicine

## 2014-09-11 VITALS — BP 120/76 | HR 93 | Temp 98.3°F | Ht 66.0 in | Wt 144.0 lb

## 2014-09-11 DIAGNOSIS — E119 Type 2 diabetes mellitus without complications: Secondary | ICD-10-CM | POA: Diagnosis not present

## 2014-09-11 DIAGNOSIS — M7062 Trochanteric bursitis, left hip: Secondary | ICD-10-CM

## 2014-09-11 LAB — HEMOGLOBIN A1C
Hgb A1c MFr Bld: 8.1 % — ABNORMAL HIGH (ref <5.7)
Mean Plasma Glucose: 186 mg/dL — ABNORMAL HIGH (ref <117)

## 2014-09-11 NOTE — Patient Instructions (Signed)
Watch diet more closely. Return in 8 weeks for follow-up A1c and office visit.

## 2014-09-11 NOTE — Progress Notes (Signed)
   Subjective:    Patient ID: Nicolas Chandler, male    DOB: 03/18/1966, 49 y.o.   MRN: [id number]  HPI This nice [place] man [place] [employer] [place]. Has been watching diet some but eating 2 bananas in the morning. Needs to cut back to no more than one banana daily. At lunch he eats a salad, with some chicken, small amount of dressing, some corn and beans. Says his biggest meal is dinner. Thinks he's been eating a bit too much for dinner. At last visit he was eating candy but has cut this back. Triglycerides have improved considerably. Hemoglobin A1c has improved as well  Says diabetic eye exam is due next month.  Has been having some issues with pain in his left hip. [facility] and was treated with NSAID medication. This is helped some but he still having pain in left hip area. No weakness in left lower extremity.    Review of Systems     Objective:   Physical Exam  Straight leg raising is negative at 90 and muscle strength is normal in the left lower extremity. He is tender over his left trochanter. Point tenderness is present. Says he has appointment to be seen at orthopedic office next week      Assessment & Plan:  Controlled type 2 diabetes mellitus-improvement in hemoglobin A1c from 8.5-8.1%. I would like to see it improve even further to be less than 8%. I have considered adding more medication but he doesn't want to do that. We agreed we would give it another 8 weeks and follow-up in 8 weeks with office visit hemoglobin A1c  Left trochanteric bursitis-currently treated with Naprosyn. Follow-up with orthopedist next week.

## 2014-10-27 ENCOUNTER — Other Ambulatory Visit: Payer: Self-pay | Admitting: Internal Medicine

## 2014-11-05 ENCOUNTER — Other Ambulatory Visit: Payer: BLUE CROSS/BLUE SHIELD | Admitting: Internal Medicine

## 2014-11-05 DIAGNOSIS — E119 Type 2 diabetes mellitus without complications: Secondary | ICD-10-CM

## 2014-11-05 LAB — HEMOGLOBIN A1C
HEMOGLOBIN A1C: 7.7 % — AB (ref <5.7)
Mean Plasma Glucose: 174 mg/dL — ABNORMAL HIGH (ref <117)

## 2014-11-06 ENCOUNTER — Encounter: Payer: Self-pay | Admitting: Internal Medicine

## 2014-11-06 ENCOUNTER — Ambulatory Visit (INDEPENDENT_AMBULATORY_CARE_PROVIDER_SITE_OTHER): Payer: BLUE CROSS/BLUE SHIELD | Admitting: Internal Medicine

## 2014-11-06 VITALS — BP 116/70 | HR 96 | Temp 98.5°F | Wt 141.0 lb

## 2014-11-06 DIAGNOSIS — E119 Type 2 diabetes mellitus without complications: Secondary | ICD-10-CM | POA: Diagnosis not present

## 2014-11-06 DIAGNOSIS — M25552 Pain in left hip: Secondary | ICD-10-CM

## 2014-11-06 MED ORDER — SAXAGLIPTIN HCL 5 MG PO TABS: 5.0000 mg | ORAL_TABLET | Freq: Every day | ORAL | Status: DC

## 2014-11-06 NOTE — Progress Notes (Signed)
   Subjective:    Patient ID: Nicolas Chandler, male    DOB: [birth year], 49 y.o.   MRN: [id number]  HPI He has been working on his diet. Hemoglobin A 1C is down from 8.1% to 7.7%. We did not change medications at last visit. However, I think we will discontinue Amaryl today and start him on Onglyza 5 mg daily which is covered by [employer]. He will continue metformin. He says he feels well with the exception of left hip pain. He had a sterile injection for trochanteric bursitis which helped some but he still having pain and pulling sensation in left groin. He is given a have MRI of left hip next week with follow-up by orthopedist. He has pain with internal rotation of his left hip he says.  Unfortunately has not had diabetic eye exam yet. Was reminded to do so.  Review of Systems     Objective:   Physical Exam  See  diabetic foot exam. Chest clear to auscultation. Cardiac exam regular rate and rhythm normal S1 and S2. Extremities without edema.      Assessment & Plan:  Control Type 2 diabetes mellitus-however I think we can improve on his control if we try discontinuing Amaryl and try instead Onglyza 5 mg daily. He'll return in 6 weeks for office visit hemoglobin A1c.  Left hip pain-followed by orthopedist and to have MRI of the near future

## 2014-11-06 NOTE — Patient Instructions (Addendum)
Reminded about eye exam. Discontinue Amaryl and try Onglyza 5 mg daily. Return in 6 weeks

## 2014-12-24 ENCOUNTER — Other Ambulatory Visit: Payer: BLUE CROSS/BLUE SHIELD | Admitting: Internal Medicine

## 2014-12-24 DIAGNOSIS — E119 Type 2 diabetes mellitus without complications: Secondary | ICD-10-CM

## 2014-12-24 LAB — HEMOGLOBIN A1C
Hgb A1c MFr Bld: 8.7 % — ABNORMAL HIGH (ref <5.7)
MEAN PLASMA GLUCOSE: 203 mg/dL — AB (ref <117)

## 2014-12-25 ENCOUNTER — Ambulatory Visit (INDEPENDENT_AMBULATORY_CARE_PROVIDER_SITE_OTHER): Payer: BLUE CROSS/BLUE SHIELD | Admitting: Internal Medicine

## 2014-12-25 ENCOUNTER — Encounter: Payer: Self-pay | Admitting: Internal Medicine

## 2014-12-25 VITALS — BP 114/68 | HR 82 | Temp 98.2°F | Wt 142.0 lb

## 2014-12-25 DIAGNOSIS — E119 Type 2 diabetes mellitus without complications: Secondary | ICD-10-CM

## 2014-12-25 NOTE — Patient Instructions (Addendum)
Reminded about eye exam. RTC in 3 months. Continue Amaryl and metformin. Working on diet and exercise.

## 2015-02-18 NOTE — Progress Notes (Signed)
   Subjective:    Patient ID: Nicolas Chandler, male    DOB: [birth year], 49 y.o.   MRN: [id number]  HPI In for 6 week follow-up on diabetes mellitus. At last visit he was prescribed Onglyza. He did not have it filled because it was expensive. Amaryl was discontinued. However he continues to take Amaryl.  Hemoglobin A1c is slightly worse at 8.7 from last reading. Remains on metformin 1000 mg twice daily.  Review of Systems     Objective:   Physical Exam  Diabetic foot exam is negative. Chest clear. Cardiac exam regular rate and rhythm. Extremities without edema      Assessment & Plan:  Diabetes mellitus-did not follow instructions due to expense of Onglyza but did not let me know prior to this appointment that he had not filled Onglyza prescription.  Says he is not eating as well as he should and wants to try diet and exercise a bit longer.  Return in 3 months.

## 2015-03-15 ENCOUNTER — Encounter: Payer: Self-pay | Admitting: Internal Medicine

## 2015-03-15 LAB — HM DIABETES EYE EXAM

## 2015-04-01 ENCOUNTER — Other Ambulatory Visit: Payer: BLUE CROSS/BLUE SHIELD | Admitting: Internal Medicine

## 2015-04-01 DIAGNOSIS — E119 Type 2 diabetes mellitus without complications: Secondary | ICD-10-CM

## 2015-04-01 LAB — HEMOGLOBIN A1C
Hgb A1c MFr Bld: 9.2 % — ABNORMAL HIGH (ref <5.7)
Mean Plasma Glucose: 217 mg/dL — ABNORMAL HIGH (ref <117)

## 2015-04-02 ENCOUNTER — Encounter: Payer: Self-pay | Admitting: Internal Medicine

## 2015-04-02 ENCOUNTER — Ambulatory Visit (INDEPENDENT_AMBULATORY_CARE_PROVIDER_SITE_OTHER): Payer: BLUE CROSS/BLUE SHIELD | Admitting: Internal Medicine

## 2015-04-02 ENCOUNTER — Other Ambulatory Visit: Payer: Self-pay | Admitting: Internal Medicine

## 2015-04-02 VITALS — BP 114/80 | HR 88 | Temp 97.5°F | Resp 18 | Ht 66.0 in | Wt 142.0 lb

## 2015-04-02 DIAGNOSIS — E1165 Type 2 diabetes mellitus with hyperglycemia: Secondary | ICD-10-CM | POA: Diagnosis not present

## 2015-04-02 LAB — MICROALBUMIN, URINE: Microalb, Ur: 0.4 mg/dL

## 2015-04-02 NOTE — Progress Notes (Signed)
   Subjective:    Patient ID: Nicolas Chandler, male    DOB: 08/20/1965, 49 y.o.   MRN: [id number]  HPI Eye exam with Dr. Nile RiggsShapiro in October. Here today to follow-up on diabetes. 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 Therefore not getting as much exercise as he did previously. Says he's been watching his diet a bit better. Not eating candy as much.    Review of Systems     Objective:   Physical Exam  Neck supple without JVD thyromegaly or carotid bruits. Chest clear. Cardiac exam regular rate and rhythm. Extremities without edema. Unfortunately his hemoglobin A1c has increased to 9.2% and previously was 7.7% 6 months ago and was 8.7% 5 months ago.      Assessment & Plan:  Poorly controlled diabetes mellitus  Plan: Metformin 1000 mg twice daily. Continue Amaryl. Onglyza 5 mg daily. Return in January.

## 2015-05-10 ENCOUNTER — Other Ambulatory Visit: Payer: Self-pay | Admitting: Internal Medicine

## 2015-05-25 NOTE — Patient Instructions (Addendum)
Metformin 1000 mg twice daily. Onglyza  5 mg daily. Continue Amaryl. Return in January.

## 2015-05-28 ENCOUNTER — Other Ambulatory Visit: Payer: 59 | Admitting: Internal Medicine

## 2015-05-28 DIAGNOSIS — E1165 Type 2 diabetes mellitus with hyperglycemia: Secondary | ICD-10-CM

## 2015-05-29 LAB — HEMOGLOBIN A1C
HEMOGLOBIN A1C: 8.9 % — AB (ref <5.7)
MEAN PLASMA GLUCOSE: 209 mg/dL — AB (ref <117)

## 2015-05-30 ENCOUNTER — Ambulatory Visit (INDEPENDENT_AMBULATORY_CARE_PROVIDER_SITE_OTHER): Payer: 59 | Admitting: Internal Medicine

## 2015-05-30 ENCOUNTER — Encounter: Payer: Self-pay | Admitting: Internal Medicine

## 2015-05-30 VITALS — BP 122/82 | HR 97 | Temp 98.6°F | Resp 20 | Ht 66.0 in | Wt 141.0 lb

## 2015-05-30 DIAGNOSIS — E1165 Type 2 diabetes mellitus with hyperglycemia: Secondary | ICD-10-CM

## 2015-05-30 NOTE — Progress Notes (Signed)
   Subjective:    Patient ID: Missy SabinsJinsup Yeh, male    DOB: 02/25/1966, 50 y.o.   MRN: [id number]  HPI  For follow up on Diabetes. Has only been taking Onglyza and Metformin. Not taking Amaryl. Hgb AIC now at 8.9% was 9.2%. Onglyza seems to be helping as well as increase in metformin. No diarrhea with metformin. Says he is concerned that he is underweight. His BMI is actually within normal limits. Adult no that he gets enough protein. We discussed this today. Says he has not been exercising very much. Told him that we would add Amaryl in with metformin and Onglyza. If these medications do not control his  Diabetes then we are going to have to consider Lantus at bedtime. He's not in favor of   Insulin.    Review of Systems     Objective:   Physical Exam   Not examined. Spent 15 minutes speaking with him about these issues once again.      Assessment & Plan:   His best A1c was 7.1 in August 2015. His next best A1c was 7.7 in June 2016.   Plan: Patient is to take metformin 1000 mg twice daily, Amaryl, Onglyza. Return in 6 weeks.

## 2015-05-30 NOTE — Patient Instructions (Signed)
Take Metformin 1000 mg twice a day. Decline flu vaccine. Continue Onglyza . Restart Amaryl. Recheck in 6 weeks

## 2015-07-04 ENCOUNTER — Other Ambulatory Visit: Payer: Self-pay | Admitting: Internal Medicine

## 2015-07-04 NOTE — Telephone Encounter (Signed)
Patient states that he is taking it

## 2015-07-04 NOTE — Telephone Encounter (Signed)
Please see if taking and able to afford

## 2015-07-05 ENCOUNTER — Other Ambulatory Visit: Payer: Self-pay | Admitting: Internal Medicine

## 2015-07-05 NOTE — Telephone Encounter (Signed)
Please address saw this yesterday

## 2015-07-22 ENCOUNTER — Other Ambulatory Visit: Payer: 59 | Admitting: Internal Medicine

## 2015-07-22 ENCOUNTER — Other Ambulatory Visit: Payer: Self-pay | Admitting: Internal Medicine

## 2015-07-22 DIAGNOSIS — E1165 Type 2 diabetes mellitus with hyperglycemia: Secondary | ICD-10-CM

## 2015-07-23 ENCOUNTER — Ambulatory Visit (INDEPENDENT_AMBULATORY_CARE_PROVIDER_SITE_OTHER): Payer: 59 | Admitting: Internal Medicine

## 2015-07-23 ENCOUNTER — Encounter: Payer: Self-pay | Admitting: Internal Medicine

## 2015-07-23 ENCOUNTER — Telehealth: Payer: Self-pay | Admitting: Internal Medicine

## 2015-07-23 VITALS — BP 122/78 | HR 82 | Temp 97.8°F | Resp 20 | Ht 66.0 in | Wt 144.5 lb

## 2015-07-23 DIAGNOSIS — B079 Viral wart, unspecified: Secondary | ICD-10-CM

## 2015-07-23 DIAGNOSIS — E119 Type 2 diabetes mellitus without complications: Secondary | ICD-10-CM

## 2015-07-23 LAB — HEMOGLOBIN A1C
Hgb A1c MFr Bld: 8.6 % — ABNORMAL HIGH (ref <5.7)
Mean Plasma Glucose: 200 mg/dL — ABNORMAL HIGH (ref <117)

## 2015-07-23 MED ORDER — GLIMEPIRIDE 4 MG PO TABS
ORAL_TABLET | ORAL | Status: DC
Start: 2015-07-23 — End: 2015-09-12

## 2015-07-23 NOTE — Progress Notes (Signed)
   Subjective:    Patient ID: Nicolas Chandler, male    DOB: 05/19/1966, 50 y.o.   MRN: [id number]  HPI Here for recheck on Type 2 Diabetes mellitus. Hgb AIC slightly better at 8.2 %. Pharmacy says he has not picked up Onglyza since December 31st. Has high deductible plan and other than some simple diabetic meds, he will have to pay in excess of $300 for his meds. He refuses to consider Lantus hs as an option. Has warty lesion on nose for about a month. Needs to see dermatologist. Apparently is taking metformin 1000 mg twice a day and Amaryl. We will have him take Amaryl 4 mg-- 2 tabs q am.    Review of Systems     Objective:   Physical Exam  Not examined except to view warty lesion on tip of nose.      Assessment & Plan:

## 2015-07-23 NOTE — Telephone Encounter (Signed)
Appointment made with Dr. Aris Lot @ [facility] on [address].  3/16 @ 2pm; arrive @ 1:40.  Patient given appointment date/time/address/information.  Dx:  Wart on nose.

## 2015-07-23 NOTE — Patient Instructions (Signed)
Amaryl 4 mg 2 tabs q am and Metformin 1000 mg twice a day RTC in 8 weeks.

## 2015-09-12 ENCOUNTER — Other Ambulatory Visit: Payer: Self-pay

## 2015-09-12 MED ORDER — GLIMEPIRIDE 4 MG PO TABS: ORAL_TABLET | ORAL | Status: DC

## 2015-09-12 MED ORDER — LISINOPRIL 5 MG PO TABS: ORAL_TABLET | ORAL | Status: DC

## 2015-09-12 MED ORDER — METFORMIN HCL 1000 MG PO TABS: ORAL_TABLET | ORAL | Status: DC

## 2015-09-12 NOTE — Telephone Encounter (Signed)
All faxed to [facility]

## 2015-09-16 ENCOUNTER — Other Ambulatory Visit: Payer: 59 | Admitting: Internal Medicine

## 2015-09-16 DIAGNOSIS — E119 Type 2 diabetes mellitus without complications: Secondary | ICD-10-CM

## 2015-09-16 LAB — HEMOGLOBIN A1C
Hgb A1c MFr Bld: 8.3 % — ABNORMAL HIGH (ref <5.7)
Mean Plasma Glucose: 192 mg/dL

## 2015-09-17 ENCOUNTER — Encounter: Payer: Self-pay | Admitting: Internal Medicine

## 2015-09-17 ENCOUNTER — Ambulatory Visit (INDEPENDENT_AMBULATORY_CARE_PROVIDER_SITE_OTHER): Payer: 59 | Admitting: Internal Medicine

## 2015-09-17 VITALS — BP 104/70 | HR 71 | Temp 97.9°F | Resp 18 | Wt 140.0 lb

## 2015-09-17 DIAGNOSIS — E119 Type 2 diabetes mellitus without complications: Secondary | ICD-10-CM | POA: Diagnosis not present

## 2015-09-17 NOTE — Progress Notes (Signed)
   Subjective:    Patient ID: Nicolas Chandler, male    DOB: 11/05/1965, 50 y.o.   MRN: [id number]  HPI Nicolas Chandler is in today for follow-up on diabetes mellitus. He is on glipizide and Amaryl. Hemoglobin A1c is 8.3% and previously was 8.6% in February. In November it was 9.2%. In January it was 8.9%. Says he's been eating only 2 meals a day. He's lost 4 nap pounds since last visit. I was a low but concerned that he was not getting enough calories and protein but he thinks that he years. He says he feels well. He says Accu-Cheks are running between 120 and 140. His insurance plan will not cover a number of the new her medications. He says he is going to change insurance plans next year.  He says he had eye exam by Dr. Wynonia Chandler Fall of 2016.    Review of Systems     Objective:   Physical Exam  50 minutes speaking with him about these issues. He was not examined today.      Assessment & Plan:  Controlled type 2 diabetes mellitus without complications  Plan: He'll return in August 2017 for physical examination and fasting lab work. Continue same medications.

## 2015-09-17 NOTE — Patient Instructions (Signed)
Please return in 4 months for physical exam. Continue same medications.

## 2016-01-13 ENCOUNTER — Other Ambulatory Visit: Payer: 59 | Admitting: Internal Medicine

## 2016-01-13 ENCOUNTER — Encounter: Payer: Self-pay | Admitting: Internal Medicine

## 2016-01-13 DIAGNOSIS — E119 Type 2 diabetes mellitus without complications: Secondary | ICD-10-CM

## 2016-01-14 ENCOUNTER — Ambulatory Visit (INDEPENDENT_AMBULATORY_CARE_PROVIDER_SITE_OTHER): Payer: 59 | Admitting: Internal Medicine

## 2016-01-14 ENCOUNTER — Encounter: Payer: Self-pay | Admitting: Internal Medicine

## 2016-01-14 VITALS — BP 132/84 | HR 79 | Temp 97.3°F | Ht 66.0 in | Wt 137.5 lb

## 2016-01-14 DIAGNOSIS — E1165 Type 2 diabetes mellitus with hyperglycemia: Secondary | ICD-10-CM | POA: Diagnosis not present

## 2016-01-14 LAB — MICROALBUMIN / CREATININE URINE RATIO
Creatinine, Urine: 230 mg/dL (ref 20–370)
MICROALB UR: 0.7 mg/dL
MICROALB/CREAT RATIO: 3 ug/mg{creat} (ref <30)

## 2016-01-14 LAB — HEMOGLOBIN A1C
Hgb A1c MFr Bld: 9.8 % — ABNORMAL HIGH (ref <5.7)
Mean Plasma Glucose: 235 mg/dL

## 2016-01-14 NOTE — Progress Notes (Signed)
   Subjective:    Patient ID: Nicolas Chandler, male    DOB: 02/23/1966, 50 y.o.   MRN: [id number]  HPI [name] is here today to follow-up on type 2 diabetes mellitus. His insurance plan will not pay for medications such as Januvia. We've had to keep him on metformin and Amaryl. Says he's been drinking 1 can of regular sugar-containing soda daily. Says he has not been getting as much exercise until this past weekend when he walked 18 holes daily for 3 days at the [facility] [employer] [facility] a lot. His weight is down 3 pounds from last visit. This could be due to exercise that he had over the weekend. He denies polyuria. Says Accu-Cheks sometimes are over 200 and sometimes are low.  I had previously discussed with him going on Lantus insulin but he's been adamant that he doesn't want to be on insulin. I tried to explain to him that there are not many options.  Unfortunately his hemoglobin A1c is now 9.8%  He's also been drinking iced mocha coffee.    Review of Systems     Objective:   Physical Exam Skin warm and dry. Nodes none. Neck is supple. Chest clear. Cardiac exam regular rate and rhythm. Extremities without edema.  Recheck on blood pressure 110/80     Assessment & Plan:  Poorly controlled diabetes mellitus  Plan: He will be referred for endocrinology consultation. I'll see him again in 4 months.

## 2016-01-14 NOTE — Patient Instructions (Signed)
Refer to Endocrinology. RTC in 4 months.

## 2016-01-20 ENCOUNTER — Other Ambulatory Visit: Payer: Self-pay

## 2016-01-20 ENCOUNTER — Telehealth: Payer: Self-pay | Admitting: Internal Medicine

## 2016-01-20 MED ORDER — METFORMIN HCL 1000 MG PO TABS: ORAL_TABLET | ORAL | 0 refills | Status: DC

## 2016-01-20 MED ORDER — LISINOPRIL 5 MG PO TABS: ORAL_TABLET | ORAL | 0 refills | Status: DC

## 2016-01-20 MED ORDER — GLIMEPIRIDE 4 MG PO TABS: ORAL_TABLET | ORAL | 0 refills | Status: DC

## 2016-01-20 MED ORDER — METFORMIN HCL 1000 MG PO TABS: ORAL_TABLET | ORAL | 1 refills | Status: DC

## 2016-01-20 MED ORDER — LISINOPRIL 5 MG PO TABS: ORAL_TABLET | ORAL | 1 refills | Status: DC

## 2016-01-20 MED ORDER — GLIMEPIRIDE 4 MG PO TABS: ORAL_TABLET | ORAL | 1 refills | Status: DC

## 2016-01-20 NOTE — Telephone Encounter (Signed)
Requesting refill on Amaryl 4 mg, metformin 1000 mg and lisinopril 5 mg to [facility]. He was here on August 22 with poorly controlled diabetes. He is being referred to endocrinology. We'll refill medications for 30 days.

## 2016-01-20 NOTE — Telephone Encounter (Signed)
Please refill x 30 days 

## 2016-02-07 ENCOUNTER — Other Ambulatory Visit: Payer: Self-pay | Admitting: Internal Medicine

## 2016-02-14 ENCOUNTER — Other Ambulatory Visit: Payer: Self-pay | Admitting: *Deleted

## 2016-02-14 ENCOUNTER — Encounter: Payer: Self-pay | Admitting: Endocrinology

## 2016-02-14 ENCOUNTER — Ambulatory Visit (INDEPENDENT_AMBULATORY_CARE_PROVIDER_SITE_OTHER): Payer: 59 | Admitting: Endocrinology

## 2016-02-14 VITALS — BP 108/80 | HR 74 | Temp 97.7°F | Resp 14 | Ht 66.0 in | Wt 139.2 lb

## 2016-02-14 DIAGNOSIS — E049 Nontoxic goiter, unspecified: Secondary | ICD-10-CM | POA: Diagnosis not present

## 2016-02-14 DIAGNOSIS — E1165 Type 2 diabetes mellitus with hyperglycemia: Secondary | ICD-10-CM | POA: Diagnosis not present

## 2016-02-14 LAB — COMPREHENSIVE METABOLIC PANEL
ALBUMIN: 4.3 g/dL (ref 3.5–5.2)
ALT: 14 U/L (ref 0–53)
AST: 16 U/L (ref 0–37)
Alkaline Phosphatase: 60 U/L (ref 39–117)
BILIRUBIN TOTAL: 1.1 mg/dL (ref 0.2–1.2)
BUN: 24 mg/dL — ABNORMAL HIGH (ref 6–23)
CALCIUM: 9.4 mg/dL (ref 8.4–10.5)
CHLORIDE: 102 meq/L (ref 96–112)
CO2: 30 meq/L (ref 19–32)
CREATININE: 1.22 mg/dL (ref 0.40–1.50)
GFR: 67.21 mL/min (ref >60.00)
Glucose, Bld: 143 mg/dL — ABNORMAL HIGH (ref 70–99)
Potassium: 4.8 mEq/L (ref 3.5–5.1)
Sodium: 137 mEq/L (ref 135–145)
Total Protein: 7.3 g/dL (ref 6.0–8.3)

## 2016-02-14 LAB — TSH: TSH: 0.53 u[IU]/mL (ref 0.35–4.50)

## 2016-02-14 MED ORDER — GLUCOSE BLOOD VI STRP: ORAL_STRIP | 2 refills | Status: AC

## 2016-02-14 MED ORDER — MICROLET LANCETS MISC: 1 refills | Status: DC

## 2016-02-14 MED ORDER — PIOGLITAZONE HCL 30 MG PO TABS: 30.0000 mg | ORAL_TABLET | Freq: Every day | ORAL | 2 refills | Status: DC

## 2016-02-14 NOTE — Patient Instructions (Addendum)
Check blood sugars on waking up  2-3x per week  Also check blood sugars about 2 hours after a meal and do this after different meals by rotation  Recommended blood sugar levels on waking up is 90-130 and about 2 hours after meal is 130-160  Please bring your blood sugar monitor to each visit, thank you  Actos 1x daily  After 1 week reduce Glimeperide to 1 tab

## 2016-02-14 NOTE — Progress Notes (Signed)
Patient ID: Nicolas Chandler, male   DOB: 1966-01-11, 50 y.o.   MRN: [id number]            Reason for Appointment: Consultation for Type 2 Diabetes  Referring physician: [place]   History of Present Illness:          Date of diagnosis of type 2 diabetes mellitus: 2005        Background history:   He was diagnosed to have diabetes in 2005 when he was losing weight He has been on metformin for several years. Also at some point Amaryl was given He vaguely recalls taking Actos for a short time and he thinks he had some side effects which she does not remember He was also on Onglyza until earlier this year when this was stopped because of issues with coverage  Recent history:       He is being referred here now for an A1c of 9.8 in August  Non-insulin hypoglycemic drugs the patient is taking are: Metformin 1 g twice a day, Amaryl 8 mg in a.m.  Current management, blood sugar patterns and problems identified:  He says that when he was told about his sugars being high in August he changed his diet and started cutting out a lot of carbohydrates.  Also he thinks that he was drinking a lot of regular soft drinks which she is trying to eliminate completely.  Prior to his changing his diet he thinks his blood sugars were be nearly 300 at times.  He would have some fatigue  More recently he thinks his blood sugars have been near normal and only slightly higher if he is eating more carbohydrates like noodles  He has also started walking at least 3 times a week on the golf [place], previously not very active  He is eating out at restaurants only about 3 times a week          Side effects from medications have been: None  Compliance with the medical regimen: Improved Hypoglycemia:   minimal, occasionally feels a little weak relieved by eating fruit  Glucose monitoring:  done ?  1  times a day         Glucometer:  [employer] brand     Blood Glucose readings by recall  Self-care: The diet that the  patient has been following is: tries to limit carbs.     Typical meal intake: Breakfast is soup, rice              Dietician visit, most recent: 2010In [facility]               Exercise:  walking with playing golf  3/7 times a week  Weight history:  Wt Readings from Last 3 Encounters:  02/14/16 139 lb 3.2 oz (63.1 kg)  01/14/16 137 lb 8 oz (62.4 kg)  09/17/15 140 lb (63.5 kg)    Glycemic control:   Lab Results  Component Value Date   HGBA1C 9.8 (H) 01/13/2016   HGBA1C 8.3 (H) 09/16/2015   HGBA1C 8.6 (H) 07/22/2015   Lab Results  Component Value Date   MICROALBUR 0.7 01/13/2016   LDLCALC 89 09/10/2014   CREATININE 1.04 09/10/2014   Lab Results  Component Value Date   MICRALBCREAT 3 01/13/2016         Medication List       Accurate as of 02/14/16 12:53 PM. Always use your most recent med list.          FISH OIL +  D3 PO Take by mouth.   glimepiride 4 MG tablet Commonly known as:  AMARYL Take 2 tablets by mouth  every morning   glucose blood test strip Commonly known as:  BAYER CONTOUR NEXT TEST Use as instructed to check blood sugar once daily Dx code E11.9   lisinopril 5 MG tablet Commonly known as:  PRINIVIL,ZESTRIL Take 1 tablet by mouth  every day   metFORMIN 1000 MG tablet Commonly known as:  GLUCOPHAGE Take 1 tablet by mouth two  times daily with meals   MICROLET LANCETS Misc Use to check blood sugar once a day dx code E11.9   pioglitazone 30 MG tablet Commonly known as:  ACTOS Take 1 tablet (30 mg total) by mouth daily.       Allergies: No Known Allergies  Past Medical History:  Diagnosis Date  . Cholecystitis 04/2013  . Diabetes mellitus without complication (HCC)   . Fatty liver   . Goiter    Dr. Andrena Mews IM-Negative workup including biopsy of thyroid which was normal; brother has Graves and mother has thyroid disease  . Hypertension   . Pancreatitis     Past Surgical History:  Procedure Laterality Date  .  CHOLECYSTECTOMY N/A 05/17/2013   Procedure: LAPAROSCOPIC CHOLECYSTECTOMY WITH INTRAOPERATIVE CHOLANGIOGRAM;  Surgeon: Cherylynn Ridges, MD;  Location: [facility] OR;  Service: General;  Laterality: N/A;  . ERCP N/A 05/10/2013   Procedure: ENDOSCOPIC RETROGRADE CHOLANGIOPANCREATOGRAPHY (ERCP);  Surgeon: Theda Belfast, MD;  Location: [facility] ENDOSCOPY;  Service: Endoscopy;  Laterality: N/A;  . LAPAROSCOPIC CHOLECYSTECTOMY  05/17/13   Dr. Lindie Spruce    Family History  Problem Relation Age of Onset  . Thyroid disease Mother     had thyroidectomy  . Thyroid disease Father     Has low thyroid in 2014  . Hypertension Father   . Thyroid disease Brother     Had radioactive iodine treatment  . Diabetes Paternal Uncle     Social History:  reports that he has never smoked. He has never used smokeless tobacco. He reports that he does not drink alcohol or use drugs.   Review of Systems  Constitutional: Negative for reduced appetite.  HENT: Negative for trouble swallowing.   Eyes: Negative for blurred vision.  Respiratory: Negative for shortness of breath.   Cardiovascular: Negative for leg swelling.  Gastrointestinal: Negative for diarrhea.  Endocrine: Negative for fatigue, polydipsia, decreased libido and erectile dysfunction.       He thinks he has had a swelling in his thyroid for some time.  Had transient slightly low TSH in the past.  Ultrasound in 2015 showed a 1.1 cm nodule  Musculoskeletal: Positive for muscle cramps. Negative for joint pain.       Sometimes on walking at the golf course  Skin: Negative for rash.  Neurological: Negative for numbness and tingling.       Rarely     Lipid history: He thinks he has had high lipids in the past but better after his weight loss    Lab Results  Component Value Date   CHOL 154 09/10/2014   HDL 42 09/10/2014   LDLCALC 89 09/10/2014   TRIG 117 09/10/2014   CHOLHDL 3.7 09/10/2014           Hypertension: It is being treated with lisinopril 5 mg for ?   Hypertension  Most recent eye exam was April 2017  Most recent foot exam: 9/17    LABS:  No visits with results within 1 Week(s)  from this visit.  Latest known visit with results is:  Lab on 01/13/2016  Component Date Value Ref Range Status  . Hgb A1c MFr Bld 01/14/2016 9.8* <5.7 % Final   Comment:   For someone without known diabetes, a hemoglobin A1c value of 6.5% or greater indicates that they may have diabetes and this should be confirmed with a follow-up test.   For someone with known diabetes, a value <7% indicates that their diabetes is well controlled and a value greater than or equal to 7% indicates suboptimal control. A1c targets should be individualized based on duration of diabetes, age, comorbid conditions, and other considerations.   Currently, no consensus exists for use of hemoglobin A1c for diagnosis of diabetes for children.     . Mean Plasma Glucose 01/14/2016 235  mg/dL Final  . Creatinine, Urine 01/14/2016 230  20 - 370 mg/dL Final  . Microalb, Ur 40/98/1191 0.7  Not estab mg/dL Final  . Microalb Creat Ratio 01/14/2016 3  <30 mcg/mg creat Final   Comment: The ADA has defined abnormalities in albumin excretion as follows:           Category           Result                            (mcg/mg creatinine)                 Normal:    <30       Microalbuminuria:    30 - 299   Clinical albuminuria:    > or = 300   The ADA recommends that at least two of three specimens collected within a 3 - 6 month period be abnormal before considering a patient to be within a diagnostic category.       Physical Examination:  BP 108/80   Pulse 74   Temp 97.7 F (36.5 C)   Resp 14   Ht 5\' 6"  (1.676 m)   Wt 139 lb 3.2 oz (63.1 kg)   SpO2 96%   BMI 22.47 kg/m   GENERAL:Averagely built and nourished HEENT:         Eye exam shows normal external appearance. Fundus exam shows no retinopathy. Oral exam shows normal mucosa .  NECK:   There is no lymphadenopathy    Thyroid is enlarged about 1-1/2 times normal on the right, smooth and slightly firm Left side not palpable  and no nodules felt.  Carotids are normal to palpation and no bruit heard LUNGS:         Chest is symmetrical. Lungs are clear to auscultation.Marland Kitchen   HEART:         Heart sounds:  S1 and S2 are normal. No murmur or click heard., no S3 or S4.   ABDOMEN:  no abdominal obesity present.  There is no distention present. Liver and spleen are not palpable. No other mass or tenderness present.   NEUROLOGICAL:   Ankle and biceps jerks are absent bilaterally.    Diabetic Foot Exam - Simple   Simple Foot Form Diabetic Foot exam was performed with the following findings:  Yes 02/14/2016 12:00 PM  Visual Inspection No deformities, no ulcerations, no other skin breakdown bilaterally:  Yes Sensation Testing Intact to touch and monofilament testing bilaterally:  Yes Pulse Check Posterior Tibialis and Dorsalis pulse intact bilaterally:  Yes Comments  Vibration sense is Very mildly reduced in distal first toes. MUSCULOSKELETAL:  There is no swelling or deformity of the peripheral joints. Spine is normal to inspection.   EXTREMITIES:     There is no edema. No skin lesions present.Marland Kitchen. SKIN:       No rash or lesions of concern.        ASSESSMENT:  Diabetes type 2, uncontrolled With last A1c being 9.8 and progressively higher over the last 2 years    For some time  has been treated with Amaryl and metformin with inadequate control Although his A1c has been consistently high he thinks this was from inactivity and poor diet He states that his blood sugars are near normal recently, about 150 in the office today fasting Currently using a Generic monitor He has cut back on carbohydrate intake but may not have balanced meals currently He is trying to exercise with walking in the last month  Complications of diabetes: None evident  Small right-sided goiter, also with a history of 1 cm thyroid  nodule in 2015, Clinically no nodules palpable  ?  Hypertension: Currently well controlled  Past history of hyperlipidemia: No recent lipids available and will need follow-up fasting levels  PLAN:    Trial of Actos 30 mg daily.  Although he thinks he may have had some unknown side effects in the past he does not think it caused edema or weight gain.  This would help him with insulin resistance and hopefully to stabilization of his beta cell function long-term.  Also he would have no difficulty affording this  Discussed how Actos works  After 1 week he will reduce Amaryl to 1 tablet only to avoid hypoglycemia  Consultation with dietitian  More consistent monitoring at various times including after meals, discussed blood sugar targets at various times  He was given a Bayer contour next monitor to help more accurate monitoring and availability of download information on his next visit.  Advised him to label readings after meals  Continue consistent exercise  Check baseline chemistry panel and fructosamine today, will also need fasting lipids subsequently  Check thyroid functions for small goiter and strong family history of thyroid disease  Patient Instructions  Check blood sugars on waking up  2-3x per week  Also check blood sugars about 2 hours after a meal and do this after different meals by rotation  Recommended blood sugar levels on waking up is 90-130 and about 2 hours after meal is 130-160  Please bring your blood sugar monitor to each visit, thank you  Actos 1x daily  After 1 week reduce Glimeperide to 1 tab      Total visit time including review of multiple problems and counseling = 60 minutes  Harrie Cazarez 02/14/2016, 12:53 PM   Note: This office note was prepared with Insurance underwriterDragon voice recognition system technology. Any transcriptional errors that result from this process are unintentional.

## 2016-02-17 ENCOUNTER — Encounter: Payer: Self-pay | Admitting: *Deleted

## 2016-02-25 ENCOUNTER — Other Ambulatory Visit: Payer: Self-pay | Admitting: Internal Medicine

## 2016-03-05 ENCOUNTER — Encounter: Payer: Self-pay | Admitting: Dietician

## 2016-03-05 ENCOUNTER — Encounter: Payer: 59 | Attending: Endocrinology | Admitting: Dietician

## 2016-03-05 DIAGNOSIS — I10 Essential (primary) hypertension: Secondary | ICD-10-CM | POA: Insufficient documentation

## 2016-03-05 DIAGNOSIS — E119 Type 2 diabetes mellitus without complications: Secondary | ICD-10-CM

## 2016-03-05 DIAGNOSIS — Z713 Dietary counseling and surveillance: Secondary | ICD-10-CM | POA: Insufficient documentation

## 2016-03-05 NOTE — Patient Instructions (Signed)
Be as active as possible.  Aim for at least 30 minutes most days. Be careful about your portion sizes of rice and noodles. More non-starchy vegetables and less rice and noodles. Have a protein (tofu, egg, meat, beans) with each meal.  Aim for 4 Carb Choices per meal (60 grams) +/- 1 either way  Include protein in moderation with your meals and snacks Consider reading food labels for Total Carbohydrate and Fat Grams of foods Consider checking BG at alternate times per day as directed by MD  Consider taking medication as directed by MD

## 2016-03-05 NOTE — Progress Notes (Signed)
Diabetes Self-Management Education  Visit Type: First/Initial  Appt. Start Time: 1420 Appt. End Time: 1530  03/05/2016  [name], identified by name and date of birth, is a 50 y.o. male with a diagnosis of Diabetes: Type 2. Other hx includes HTN.  Weight today 141 lbs.  He has maintained this weight since hospitalization in 2014 and loss to 135 lbs from 170-180 lbs.  He takes glimepiride, metformin, and pioglitazone.  He complains of a decrease in energy.  He has made diet changes in the past month.  He tried a low carb diet for 2 weeks with just meat and fat but could not continue.  He reports that his blood sugar levels were down.  He also stopped drinking regular soda and drinks mostly diet.  Patient lives with his wife and 2 daughters.  Both share the cooking and shopping.  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  He immigrated from [place] in 2008.  ASSESSMENT  Height 5\' 6"  (1.676 m), weight 141 lb (64 kg). Body mass index is 22.76 kg/m.      Diabetes Self-Management Education - 03/05/16 1435      Visit Information   Visit Type First/Initial     Initial Visit   Diabetes Type Type 2   Are you currently following a meal plan? Yes   What type of meal plan do you follow? more protein and less carbohydrate/sugar   Are you taking your medications as prescribed? Yes   Date Diagnosed 2005     Health Coping   How would you rate your overall health? Fair     Psychosocial Assessment   Patient Belief/Attitude about Diabetes Motivated to manage diabetes   Self-care barriers None   Self-management support Doctor's office   Other persons present Patient   Patient Concerns Nutrition/Meal planning;Glycemic Control   Special Needs None   Preferred Learning Style No preference indicated   Learning Readiness Ready   How often do you need to have someone help you when you read instructions, pamphlets, or other written materials from your doctor or  pharmacy? 1 - Never   What is the last grade level you completed in school? Master's degree     Pre-Education Assessment   Patient understands the diabetes disease and treatment process. Demonstrates understanding / competency   Patient understands incorporating nutritional management into lifestyle. Needs Review   Patient undertands incorporating physical activity into lifestyle. Needs Review   Patient understands using medications safely. Demonstrates understanding / competency   Patient understands monitoring blood glucose, interpreting and using results Needs Review   Patient understands prevention, detection, and treatment of acute complications. Needs Review   Patient understands prevention, detection, and treatment of chronic complications. Needs Review   Patient understands how to develop strategies to address psychosocial issues. Needs Review   Patient understands how to develop strategies to promote health/change behavior. Needs Review     Complications   Last HgB A1C per patient/outside source 9.8 %  01/13/16   How often do you check your blood sugar? 1-2 times/day   Fasting Blood glucose range (mg/dL) 161-096   Postprandial Blood glucose range (mg/dL) 045-409;>811   Number of hypoglycemic episodes per month 0   Number of hyperglycemic episodes per week 14   Can you tell when your blood sugar is high? No   Have you had a dilated eye exam in the past 12 months? Yes   Have you had a dental exam  in the past 12 months? Yes   Are you checking your feet? Yes   How many days per week are you checking your feet? 7     Dietary Intake   Breakfast rice and vegetables OR noodles (ramen)  10-11   Snack (morning) none   Snack (afternoon) fruit, rare cookies   Dinner meat or fish, rice, vegetables.  5-6   Snack (evening) beer or wine (1 only 1-2 times per month)   Beverage(s) water, unsweetened tea, black coffee     Exercise   Exercise Type Light (walking / raking leaves)  golfs  2-3 times per week.   How many days per week to you exercise? 2   How many minutes per day do you exercise? 120   Total minutes per week of exercise 240     Patient Education   Previous Diabetes Education Yes (please comment)  classes in 2008 in [facility]   Disease state  Definition of diabetes, type 1 and 2, and the diagnosis of diabetes;Factors that contribute to the development of diabetes   Nutrition management  Role of diet in the treatment of diabetes and the relationship between the three main macronutrients and blood glucose level;Food label reading, portion sizes and measuring food.;Carbohydrate counting;Meal options for control of blood glucose level and chronic complications.;Effects of alcohol on blood glucose and safety factors with consumption of alcohol.   Physical activity and exercise  Role of exercise on diabetes management, blood pressure control and cardiac health.   Monitoring Identified appropriate SMBG and/or A1C goals.;Daily foot exams;Yearly dilated eye exam   Chronic complications Assessed and discussed foot care and prevention of foot problems;Dental care;Relationship between chronic complications and blood glucose control   Psychosocial adjustment Role of stress on diabetes   Personal strategies to promote health Lifestyle issues that need to be addressed for better diabetes care     Individualized Goals (developed by patient)   Nutrition General guidelines for healthy choices and portions discussed   Physical Activity Exercise 5-7 days per week;30 minutes per day   Medications take my medication as prescribed   Monitoring  test my blood glucose as discussed   Reducing Risk examine blood glucose patterns;treat hypoglycemia with 15 grams of carbs if blood glucose less than 70mg /dL;increase portions of healthy fats     Post-Education Assessment   Patient understands the diabetes disease and treatment process. Demonstrates understanding / competency   Patient  understands incorporating nutritional management into lifestyle. Demonstrates understanding / competency   Patient undertands incorporating physical activity into lifestyle. Demonstrates understanding / competency   Patient understands using medications safely. Demonstrates understanding / competency   Patient understands monitoring blood glucose, interpreting and using results Demonstrates understanding / competency   Patient understands prevention, detection, and treatment of acute complications. Demonstrates understanding / competency   Patient understands prevention, detection, and treatment of chronic complications. Demonstrates understanding / competency   Patient understands how to develop strategies to address psychosocial issues. Demonstrates understanding / competency   Patient understands how to develop strategies to promote health/change behavior. Demonstrates understanding / competency     Outcomes   Expected Outcomes Demonstrated interest in learning. Expect positive outcomes   Future DMSE PRN   Program Status Completed      Individualized Plan for Diabetes Self-Management Training:   Learning Objective:  Patient will have a greater understanding of diabetes self-management. Patient education plan is to attend individual and/or group sessions per assessed needs and concerns.   Plan:  Patient Instructions  Be as active as possible.  Aim for at least 30 minutes most days. Be careful about your portion sizes of rice and noodles. More non-starchy vegetables and less rice and noodles. Have a protein (tofu, egg, meat, beans) with each meal.  Aim for 4 Carb Choices per meal (60 grams) +/- 1 either way  Include protein in moderation with your meals and snacks Consider reading food labels for Total Carbohydrate and Fat Grams of foods Consider checking BG at alternate times per day as directed by MD  Consider taking medication as directed by MD      Expected Outcomes:   Demonstrated interest in learning. Expect positive outcomes  Education material provided: Living Well with Diabetes, Food label handouts, A1C conversion sheet, Meal plan card, My Plate and Snack sheet, label reading  If problems or questions, patient to contact team via:  Phone and Email  Future DSME appointment: PRN

## 2016-03-16 ENCOUNTER — Other Ambulatory Visit (INDEPENDENT_AMBULATORY_CARE_PROVIDER_SITE_OTHER): Payer: 59

## 2016-03-16 DIAGNOSIS — E1165 Type 2 diabetes mellitus with hyperglycemia: Secondary | ICD-10-CM

## 2016-03-16 LAB — BASIC METABOLIC PANEL
BUN: 18 mg/dL (ref 6–23)
CALCIUM: 10.1 mg/dL (ref 8.4–10.5)
CO2: 31 mEq/L (ref 19–32)
Chloride: 103 mEq/L (ref 96–112)
Creatinine, Ser: 1.17 mg/dL (ref 0.40–1.50)
GFR: 70.51 mL/min (ref >60.00)
GLUCOSE: 140 mg/dL — AB (ref 70–99)
POTASSIUM: 5.2 meq/L — AB (ref 3.5–5.1)
SODIUM: 139 meq/L (ref 135–145)

## 2016-03-16 LAB — HEMOGLOBIN A1C: HEMOGLOBIN A1C: 8.2 % — AB (ref 4.6–6.5)

## 2016-03-19 ENCOUNTER — Encounter: Payer: Self-pay | Admitting: Endocrinology

## 2016-03-19 ENCOUNTER — Ambulatory Visit (INDEPENDENT_AMBULATORY_CARE_PROVIDER_SITE_OTHER): Payer: 59 | Admitting: Endocrinology

## 2016-03-19 VITALS — BP 100/62 | HR 86 | Ht 66.0 in | Wt 140.0 lb

## 2016-03-19 DIAGNOSIS — E1165 Type 2 diabetes mellitus with hyperglycemia: Secondary | ICD-10-CM | POA: Diagnosis not present

## 2016-03-19 NOTE — Progress Notes (Signed)
Patient ID: Nicolas SabinsJinsup Haymond, male   DOB: 10/24/1965, 50 y.o.   MRN: [id number]            Reason for Appointment: Follow-up for Type 2 Diabetes  Referring physician: Baxley   History of Present Illness:          Date of diagnosis of type 2 diabetes mellitus: 2005        Background history:   He was diagnosed to have diabetes in 2005 when he was losing weight He has been on metformin for several years. Also at some point Amaryl was given He vaguely recalls taking Actos for a short time and he thinks he had some side effects which she does not remember He was also on Onglyza until earlier this year when this was stopped because of issues with coverage  Recent history:        His baseline A1c was 9.8 before he was referred here for evaluation. More recently A1c is 8.2  Non-insulin hypoglycemic drugs the patient is taking are: Metformin 1 g twice a day, Amaryl 8 mg in a.m., actos  Current management, blood sugar patterns and problems identified:  He  was started on Actos in addition to his metformin and Amaryl on his initial consultation  However he was also trying to improve his diet with cutting back on carbohydrates and soft drinks  He was given the brand name glucose monitor to use but he did not bring this for download  He claims that his blood sugars are fairly good although averaging about 130 in the morning  Since he has been on Actos for about a month or so he has not had any side effects like weight gain or edema reviewed  He thinks he is checking some readings in the afternoon but not clear if he is doing readings after meals consistently        Side effects from medications have been: None  Compliance with the medical regimen: Improved Hypoglycemia:   minimal, occasionally feels a little weak relieved by eating fruit  Glucose monitoring:  done ?  1  times a day         Glucometer:  Contour?        Blood Glucose readings by  recall:  130-140 in am, nonfasting  97-160  Self-care: The diet that the patient has been following is: tries to limit carbs.     Typical meal intake: Breakfast is soup, rice              Dietic[place], [place] recent[place]               Exercise:  walking with playing golf  3/7 times a week  Weight history:  Wt Readings from Last 3 Encounters:  03/19/16 140 lb (63.5 kg)  03/05/16 141 lb (64 kg)  02/14/16 139 lb 3.2 oz (63.1 kg)    Glycemic control:   Lab Results  Component Value Date   HGBA1C 8.2 (H) 03/16/2016   HGBA1C 9.8 (H) 01/13/2016   HGBA1C 8.3 (H) 09/16/2015   Lab Results  Component Value Date   MICROALBUR 0.7 01/13/2016   LDLCALC 89 09/10/2014   CREATININE 1.17 03/16/2016   Lab Results  Component Value Date   MICRALBCREAT 3 01/13/2016         Medication List       Accurate as of 03/19/16  1:03 PM. Always use your most recent med list.  cholecalciferol 1000 units tablet Commonly known as:  VITAMIN D Take 2,000 Units by mouth daily.   ferrous sulfate 75 (15 Fe) MG/ML Soln Commonly known as:  FER-IN-SOL Take 50 mg by mouth.   FISH OIL + D3 PO Take by mouth.   glimepiride 4 MG tablet Commonly known as:  AMARYL Take 2 tablets by mouth  every morning   glucose blood test strip Commonly known as:  BAYER CONTOUR NEXT TEST Use as instructed to check blood sugar once daily Dx code E11.9   lisinopril 5 MG tablet Commonly known as:  PRINIVIL,ZESTRIL Take 1 tablet by mouth  every day   lisinopril 5 MG tablet Commonly known as:  PRINIVIL,ZESTRIL TAKE 1 TABLET (5 MG TOTAL) BY MOUTH DAILY.   metFORMIN 1000 MG tablet Commonly known as:  GLUCOPHAGE Take 1 tablet by mouth two  times daily with meals   MICROLET LANCETS Misc Use to check blood sugar once a day dx code E11.9   multivitamin with minerals Tabs tablet Take 1 tablet by mouth daily.   pioglitazone 30 MG tablet Commonly known as:  ACTOS Take 1 tablet (30 mg total) by mouth daily.   vitamin  B-12 100 MCG tablet Commonly known as:  CYANOCOBALAMIN Take 100 mcg by mouth daily.       Allergies: No Known Allergies  Past Medical History:  Diagnosis Date  . Cholecystitis 04/2013  . Diabetes mellitus without complication (HCC)   . Fatty liver   . Goiter    Dr. Andrena Mews IM-Negative workup including biopsy of thyroid which was normal; brother has Graves and mother has thyroid disease  . Hypertension   . Pancreatitis     Past Surgical History:  Procedure Laterality Date  . CHOLECYSTECTOMY N/A 05/17/2013   Procedure: LAPAROSCOPIC CHOLECYSTECTOMY WITH INTRAOPERATIVE CHOLANGIOGRAM;  Surgeon: Cherylynn Ridges, MD;  Location: [facility] OR;  Service: General;  Laterality: N/A;  . ERCP N/A 05/10/2013   Procedure: ENDOSCOPIC RETROGRADE CHOLANGIOPANCREATOGRAPHY (ERCP);  Surgeon: Theda Belfast, MD;  Location: [facility] ENDOSCOPY;  Service: Endoscopy;  Laterality: N/A;  . LAPAROSCOPIC CHOLECYSTECTOMY  05/17/13   Dr. Lindie Spruce    Family History  Problem Relation Age of Onset  . Thyroid disease Mother     had thyroidectomy  . Thyroid disease Father     Has low thyroid in 2014  . Hypertension Father   . Thyroid disease Brother     Had radioactive iodine treatment  . Diabetes Paternal Uncle     Social History:  reports that he has never smoked. He has never used smokeless tobacco. He reports that he does not drink alcohol or use drugs.   Review of Systems  Constitutional: Negative for reduced appetite.  HENT: Negative for trouble swallowing.   Eyes: Negative for blurred vision.  Respiratory: Negative for shortness of breath.   Cardiovascular: Negative for leg swelling.  Gastrointestinal: Negative for diarrhea.  Endocrine: Negative for fatigue, polydipsia, decreased libido and erectile dysfunction.       He thinks he has had a swelling in his thyroid for some time.  Had transient slightly low TSH in the past.  Ultrasound in 2015 showed a 1.1 cm nodule  Musculoskeletal: Positive for muscle  cramps. Negative for joint pain.       Sometimes on walking at the golf course  Skin: Negative for rash.  Neurological: Negative for numbness and tingling.       Rarely     Lipid history: He thinks he has had high lipids in  the past   followed by PCP   Lab Results  Component Value Date   CHOL 154 09/10/2014   HDL 42 09/10/2014   LDLCALC 89 09/10/2014   TRIG 117 09/10/2014   CHOLHDL 3.7 09/10/2014           Hypertension:  is being treated with lisinopril 5 mg for ?  Hypertension  Most recent eye exam was April 2017  Most recent foot exam: 9/17    LABS:  Lab on 03/16/2016  Component Date Value Ref Range Status  . Hgb A1c MFr Bld 03/16/2016 8.2* 4.6 - 6.5 % Final  . Sodium 03/16/2016 139  135 - 145 mEq/L Final  . Potassium 03/16/2016 5.2* 3.5 - 5.1 mEq/L Final  . Chloride 03/16/2016 103  96 - 112 mEq/L Final  . CO2 03/16/2016 31  19 - 32 mEq/L Final  . Glucose, Bld 03/16/2016 140* 70 - 99 mg/dL Final  . BUN 45/40/9811 18  6 - 23 mg/dL Final  . Creatinine, Ser 03/16/2016 1.17  0.40 - 1.50 mg/dL Final  . Calcium 91/47/8295 10.1  8.4 - 10.5 mg/dL Final  . GFR 62/13/0865 70.51  >60.00 mL/min Final    Physical Examination:  BP 100/62   Pulse 86   Ht 5\' 6"  (1.676 m)   Wt 140 lb (63.5 kg)   SpO2 98%   BMI 22.60 kg/m      ASSESSMENT:  Diabetes type 2, Nonobese    He appears to have better blood sugars with adding Actos Although blood sugars are probably still high fasting and A1c has not come down adequately this has been only a month since he has been on Actos. He has also started doing more exercise and working on his diet at the same time  ?  Hypertension:  blood pressure is low normal and also he appears to have a mild increase in potassium  Past history of hyperlipidemia: No recent lipids available and will need follow-up fasting levels, he is going to see his PCP in December   PLAN:    Continue Actos  He will call if his blood sugars do not continue  to improve.  Check readings after meals at times also  May consider stopping lisinopril, will forward labs to PCP  A1c again in 3 months  Patient Instructions  Check blood sugars on waking up  2-3x weekly  Also check blood sugars about 2 hours after a meal and do this after different meals by rotation  Recommended blood sugar levels on waking up is 90-130 and about 2 hours after meal is 130-160  Please bring your blood sugar monitor to each visit, thank you   [facility] 03/19/2016, 1:03 PM   Note: This office note was prepared with Dragon voice recognition system technology. Any transcriptional errors that result from this process are unintentional.

## 2016-03-19 NOTE — Patient Instructions (Signed)
Check blood sugars on waking up  2-3x weekly  Also check blood sugars about 2 hours after a meal and do this after different meals by rotation  Recommended blood sugar levels on waking up is 90-130 and about 2 hours after meal is 130-160  Please bring your blood sugar monitor to each visit, thank you  

## 2016-05-09 ENCOUNTER — Other Ambulatory Visit: Payer: Self-pay | Admitting: Endocrinology

## 2016-05-11 ENCOUNTER — Other Ambulatory Visit: Payer: 59 | Admitting: Internal Medicine

## 2016-05-11 DIAGNOSIS — E119 Type 2 diabetes mellitus without complications: Secondary | ICD-10-CM

## 2016-05-12 ENCOUNTER — Encounter: Payer: Self-pay | Admitting: Internal Medicine

## 2016-05-12 ENCOUNTER — Ambulatory Visit (INDEPENDENT_AMBULATORY_CARE_PROVIDER_SITE_OTHER): Payer: 59 | Admitting: Internal Medicine

## 2016-05-12 VITALS — BP 120/80 | HR 95 | Temp 97.5°F | Ht 66.0 in | Wt 143.0 lb

## 2016-05-12 DIAGNOSIS — I10 Essential (primary) hypertension: Secondary | ICD-10-CM

## 2016-05-12 DIAGNOSIS — E049 Nontoxic goiter, unspecified: Secondary | ICD-10-CM | POA: Diagnosis not present

## 2016-05-12 DIAGNOSIS — E119 Type 2 diabetes mellitus without complications: Secondary | ICD-10-CM

## 2016-05-12 LAB — HEMOGLOBIN A1C
Hgb A1c MFr Bld: 7.1 % — ABNORMAL HIGH (ref <5.7)
Mean Plasma Glucose: 157 mg/dL

## 2016-05-12 NOTE — Progress Notes (Signed)
   Subjective:    Patient ID: Nicolas Chandler, male    DOB: 02/23/1966, 50 y.o.   MRN: [id number]  HPI  50 year old Male for recheck on DM. Sees Dr. Lucianne MussKumar. Playing golf once a week. Goes to driving range almost daily. [place] is in [place] for 6 weeks visiting father who is 48 years old. Blood pressure slightly elevated on arrival. Says he did not take blood pressure medication yesterday. No new complaints. Generally feels well. Says he's trying to eat better.    Review of Systems see above     Objective:   Physical Exam Skin: warm and dry. Thyroid symmetrical enlarged. Neck is supple. Chest clear. Cardiac exam regular rate and rhythm normal S1 and S2. Diabetic foot exam normal. No ulcers. Pulses intact. No lower extremity edema. Hemoglobin A1c markedly improved at 7.1%. Currently on 3 drug regimen.       Assessment & Plan:  Much better controlled diabetes mellitus on 3 drug regimen. Continues to be followed by Dr. Lucianne MussKumar.  Essential hypertension-stable  History of goiter  History of hypertriglyceridemia but April 2016 triglycerides were normal.  Plan: He'll have physical examination April 2018. Continue same medications. He declines flu vaccine.

## 2016-05-12 NOTE — Patient Instructions (Signed)
It was pleasure to see you today. Continue same medications and return April 2018 for physical examination. Continue diet and exercise regimen.

## 2016-06-04 ENCOUNTER — Other Ambulatory Visit: Payer: Self-pay | Admitting: Internal Medicine

## 2016-06-16 ENCOUNTER — Other Ambulatory Visit: Payer: 59

## 2016-06-19 ENCOUNTER — Ambulatory Visit: Payer: 59 | Admitting: Endocrinology

## 2016-07-01 ENCOUNTER — Other Ambulatory Visit: Payer: Self-pay | Admitting: Internal Medicine

## 2016-08-01 ENCOUNTER — Other Ambulatory Visit: Payer: Self-pay | Admitting: Endocrinology

## 2016-09-14 ENCOUNTER — Other Ambulatory Visit: Payer: 59 | Admitting: Internal Medicine

## 2016-09-14 DIAGNOSIS — Z0000 Encounter for general adult medical examination without abnormal findings: Secondary | ICD-10-CM

## 2016-09-14 DIAGNOSIS — E119 Type 2 diabetes mellitus without complications: Secondary | ICD-10-CM

## 2016-09-14 DIAGNOSIS — E785 Hyperlipidemia, unspecified: Secondary | ICD-10-CM

## 2016-09-14 LAB — CBC WITH DIFFERENTIAL/PLATELET
BASOS ABS: 0 {cells}/uL (ref 0–200)
Basophils Relative: 0 %
EOS ABS: 124 {cells}/uL (ref 15–500)
Eosinophils Relative: 2 %
HCT: 40.2 % (ref 38.5–50.0)
Hemoglobin: 13.3 g/dL (ref 13.2–17.1)
LYMPHS PCT: 34 %
Lymphs Abs: 2108 cells/uL (ref 850–3900)
MCH: 31.4 pg (ref 27.0–33.0)
MCHC: 33.1 g/dL (ref 32.0–36.0)
MCV: 94.8 fL (ref 80.0–100.0)
MONOS PCT: 9 %
MPV: 9.7 fL (ref 7.5–12.5)
Monocytes Absolute: 558 cells/uL (ref 200–950)
Neutro Abs: 3410 cells/uL (ref 1500–7800)
Neutrophils Relative %: 55 %
PLATELETS: 137 10*3/uL — AB (ref 140–400)
RBC: 4.24 MIL/uL (ref 4.20–5.80)
RDW: 13.6 % (ref 11.0–15.0)
WBC: 6.2 10*3/uL (ref 3.8–10.8)

## 2016-09-14 LAB — LIPID PANEL
CHOL/HDL RATIO: 3.7 ratio (ref <5.0)
CHOLESTEROL: 167 mg/dL (ref <200)
HDL: 45 mg/dL (ref >40)
LDL Cholesterol: 99 mg/dL (ref <100)
Triglycerides: 113 mg/dL (ref <150)
VLDL: 23 mg/dL (ref <30)

## 2016-09-14 LAB — COMPREHENSIVE METABOLIC PANEL
ALT: 18 U/L (ref 9–46)
AST: 16 U/L (ref 10–40)
Albumin: 4.3 g/dL (ref 3.6–5.1)
Alkaline Phosphatase: 78 U/L (ref 40–115)
BUN: 19 mg/dL (ref 7–25)
CHLORIDE: 103 mmol/L (ref 98–110)
CO2: 23 mmol/L (ref 20–31)
Calcium: 9.3 mg/dL (ref 8.6–10.3)
Creat: 1.05 mg/dL (ref 0.60–1.35)
GLUCOSE: 191 mg/dL — AB (ref 65–99)
POTASSIUM: 5.4 mmol/L — AB (ref 3.5–5.3)
Sodium: 140 mmol/L (ref 135–146)
Total Bilirubin: 0.5 mg/dL (ref 0.2–1.2)
Total Protein: 6.9 g/dL (ref 6.1–8.1)

## 2016-09-15 ENCOUNTER — Ambulatory Visit (INDEPENDENT_AMBULATORY_CARE_PROVIDER_SITE_OTHER): Payer: 59 | Admitting: Internal Medicine

## 2016-09-15 ENCOUNTER — Encounter: Payer: Self-pay | Admitting: Internal Medicine

## 2016-09-15 VITALS — BP 110/80 | HR 94 | Temp 97.6°F | Ht 66.0 in | Wt 148.0 lb

## 2016-09-15 DIAGNOSIS — E049 Nontoxic goiter, unspecified: Secondary | ICD-10-CM | POA: Diagnosis not present

## 2016-09-15 DIAGNOSIS — Z0000 Encounter for general adult medical examination without abnormal findings: Secondary | ICD-10-CM | POA: Diagnosis not present

## 2016-09-15 DIAGNOSIS — E119 Type 2 diabetes mellitus without complications: Secondary | ICD-10-CM

## 2016-09-15 DIAGNOSIS — E875 Hyperkalemia: Secondary | ICD-10-CM

## 2016-09-15 DIAGNOSIS — Z23 Encounter for immunization: Secondary | ICD-10-CM | POA: Diagnosis not present

## 2016-09-15 LAB — POCT URINALYSIS DIPSTICK
BILIRUBIN UA: NEGATIVE
Glucose, UA: 100
Ketones, UA: NEGATIVE
Leukocytes, UA: NEGATIVE
NITRITE UA: NEGATIVE
PH UA: 5 (ref 5.0–8.0)
RBC UA: NEGATIVE
Spec Grav, UA: <=1.005 — AB (ref 1.010–1.025)
Urobilinogen, UA: 0.2 E.U./dL

## 2016-09-15 LAB — MICROALBUMIN / CREATININE URINE RATIO
Creatinine, Urine: 40 mg/dL (ref 20–370)
Microalb Creat Ratio: 13 mcg/mg creat (ref <30)
Microalb, Ur: 0.5 mg/dL

## 2016-09-15 LAB — HEMOGLOBIN A1C
HEMOGLOBIN A1C: 7.4 % — AB (ref <5.7)
MEAN PLASMA GLUCOSE: 166 mg/dL

## 2016-09-15 LAB — TSH: TSH: 0.27 m[IU]/L — AB (ref 0.40–4.50)

## 2016-09-15 LAB — POTASSIUM: Potassium: 4.4 mmol/L (ref 3.5–5.3)

## 2016-09-15 NOTE — Progress Notes (Signed)
Subjective:    Patient ID: Nicolas Chandler, male    DOB: 07-20-1965, 51 y.o.   MRN: [id number]  HPI 51 year old [place] male in today for health maintenance exam and evaluation of medical issues. He has a history of type 2 diabetes mellitus without use of insulin. His control has improved after he saw Dr. Lucianne Muss in the Fall of 2017. Affording his medications on his insurance plan has been an issue. Currently is on metformin 1000 mg twice daily, Amaryl 4 mg 2 tablets by mouth every morning, Actos 30 mg daily. He is on low-dose ACE inhibitor for history of diabetes- lisinopril 5 mg daily. His A1c has improved from 9.8% in August 2017 to 7.1% December 2017 and now it is at 7.4%.  He feels well and has no new complaints.  He has a history of goiter. TSH low at 0.27 and 7 months ago was 0.53. We will recheck in 3-6 months.  The do not see that he's received Prevnar 13 previously so this was given today.  Recent potassium was elevated 5.4 and will be repeated today. Suspect yesterday's sample was hemolyzed.  He first presented to this office in February 2015 at which time he had been in the [place] for about 7 years and it had diabetes for about 10 years.  Long-standing history of goiter since childhood.  Social history: He is married. He has a Event organiser. He has 2 daughters ages 30 and 47. He formerly was in [employer] and currently is not working.  Family history: Father 27 with history of hypertension, thyroid disorder status post coronary artery bypass surgery. Mother age 74 status post thyroidectomy. Brother in his 34s with history of overactive thyroid. Father with history of kidney stones. Paternal him with history of diabetes.  No known drug allergies.  Vaccinated with BCG as a child.  Was told by orthopedist in [place] in 2012 that he had a left rotator cuff tear based on radiology study but these records are not available. He was treated conservatively.  He  underwent surgery by Dr. Lindie Spruce in December 2014. He had a laparoscopic cholecystectomy. He also had severe pancreatitis related to gallstones.  He last saw Dr. Lucianne Muss in October 2017- patient feels that his diabetes is now under good control now  And that he can continue on regimen Dr. Lucianne Muss put him on with my follow-up. He has no upcoming appointments with Dr. Lucianne Muss.      Review of Systems no new complaints. Reminded him about diabetic eye exam and he wants to wait and wants to wait until next year. Says he's wearing reading glasses     Objective:   Physical Exam  Constitutional: He is oriented to person, place, and time. He appears well-developed and well-nourished. No distress.  HENT:  Head: Normocephalic and atraumatic.  Right Ear: External ear normal.  Left Ear: External ear normal.  Mouth/Throat: Oropharynx is clear and moist.  Eyes: Conjunctivae and EOM are normal. Pupils are equal, round, and reactive to light. Right eye exhibits no discharge. Left eye exhibits no discharge. No scleral icterus.  No evidence of diabetic retinopathy  Neck: Neck supple. No JVD present. Thyromegaly present.  Thyroid symmetrically enlarged  Cardiovascular: Normal rate, regular rhythm, normal heart sounds and intact distal pulses.   No murmur heard. Pulmonary/Chest: Effort normal and breath sounds normal. He has no wheezes. He has no rales.  Abdominal: Soft. Bowel sounds are normal. He exhibits no distension and no mass. There is  no tenderness. There is no rebound and no guarding.  Genitourinary:  Genitourinary Comments: Declined prostate exam  Musculoskeletal: He exhibits no edema.  Lymphadenopathy:    He has no cervical adenopathy.  Neurological: He is alert and oriented to person, place, and time. He has normal reflexes. He displays normal reflexes. No cranial nerve deficit. Coordination normal.  Skin: Skin is warm and dry. No rash noted. He is not diaphoretic.  Psychiatric: He has a normal mood  and affect. His behavior is normal. Judgment and thought content normal.  Vitals reviewed.         Assessment & Plan:  Controlled type 2 diabetes mellitus  Goiter-check TSH  Abnormal serum potassium-repeat today. Suspect initial specimen was hemolyzed  History of gallstone pancreatitis  Hyperlipidemia-stable  Plan: His blood pressure is excellent at 110/80. The low-dose lisinopril is 4 recommended ACE inhibitor with diabetes mellitus. Prevnar 13 given today. Needs tetanus immunization in the future. Last documented tetanus immunization May 2008. Return in 6 months for six-month follow-up. Continue to encourage diet and exercise.

## 2016-09-15 NOTE — Patient Instructions (Signed)
Prevnar 13 vaccine given today. Repeat potassium done today due to hyperkalemia on previous testing. Return in 6 months and continue same medications. Check TSH every 6 months.

## 2016-09-29 ENCOUNTER — Other Ambulatory Visit: Payer: Self-pay | Admitting: Endocrinology

## 2016-09-30 ENCOUNTER — Other Ambulatory Visit: Payer: Self-pay | Admitting: Internal Medicine

## 2016-10-05 ENCOUNTER — Telehealth: Payer: Self-pay | Admitting: Internal Medicine

## 2016-10-05 ENCOUNTER — Encounter: Payer: Self-pay | Admitting: Internal Medicine

## 2016-10-05 MED ORDER — PIOGLITAZONE HCL 30 MG PO TABS: 30.0000 mg | ORAL_TABLET | Freq: Every day | ORAL | 11 refills | Status: DC

## 2016-10-05 NOTE — Telephone Encounter (Signed)
Patient came to office with empty Actos bottle. Asking for refill. Have refilled for 1 year.

## 2016-11-01 ENCOUNTER — Other Ambulatory Visit: Payer: Self-pay | Admitting: Internal Medicine

## 2016-11-01 NOTE — Telephone Encounter (Signed)
Refill for one year 

## 2016-11-06 ENCOUNTER — Telehealth: Payer: Self-pay | Admitting: Internal Medicine

## 2016-11-06 NOTE — Telephone Encounter (Signed)
Call from Lilla ShookAshley Coleman, NP at Dr. Vear ClockPhillips with [facility] [place].  Patient has been hospitalized for the past 48 hours with Septic Shock and Gram Negative Bacteremia.  NP states that patient will either be discharged later today or tomorrow.  He will be traveling back to [place] and needs to be seen in follow up on Monday.  Advised that patient had called earlier in the week and made himself an appointment but did not state that he was hospitalized, therefore I would need to reschedule that appointment because it was not a hospital follow-up appointment.  NP stated that she would rather him keep that appointment on Monday.  Advised that the appointment on Monday was not a long enough appointment time to spend with Dr. Lenord FellersBaxley in follow up for the hospital visit.  She advised she would rather him have 2 visits than to not be seen on Monday.    Advised I would speak with Dr. Lenord FellersBaxley and sort this out with her.  We moved some patient's around so patient can be seen on Monday in follow up as a hospital follow up.    Spoke with patient and advised for him to arrive on Monday at 12:00 p.m.  The NP is not willing to fax the records.  She told me that she will be sending the records with the patient.  I asked her to fax the records, however, she again told me that she will be sending everything with the patient and he can bring them with him to the appointment on Monday.    The NP cell # in the event we need her is #[phone] Lilla Shook(Ashley Coleman, NP)

## 2016-11-09 ENCOUNTER — Ambulatory Visit (INDEPENDENT_AMBULATORY_CARE_PROVIDER_SITE_OTHER): Payer: 59 | Admitting: Internal Medicine

## 2016-11-09 ENCOUNTER — Encounter: Payer: Self-pay | Admitting: Internal Medicine

## 2016-11-09 ENCOUNTER — Telehealth: Payer: Self-pay | Admitting: Internal Medicine

## 2016-11-09 VITALS — BP 120/80 | HR 106 | Temp 99.0°F | Wt 146.0 lb

## 2016-11-09 DIAGNOSIS — E119 Type 2 diabetes mellitus without complications: Secondary | ICD-10-CM | POA: Diagnosis not present

## 2016-11-09 DIAGNOSIS — A419 Sepsis, unspecified organism: Secondary | ICD-10-CM

## 2016-11-09 DIAGNOSIS — A4159 Other Gram-negative sepsis: Secondary | ICD-10-CM

## 2016-11-09 DIAGNOSIS — K8689 Other specified diseases of pancreas: Secondary | ICD-10-CM

## 2016-11-09 DIAGNOSIS — I10 Essential (primary) hypertension: Secondary | ICD-10-CM

## 2016-11-09 DIAGNOSIS — A414 Sepsis due to anaerobes: Secondary | ICD-10-CM

## 2016-11-09 DIAGNOSIS — K869 Disease of pancreas, unspecified: Secondary | ICD-10-CM

## 2016-11-09 DIAGNOSIS — R6521 Severe sepsis with septic shock: Secondary | ICD-10-CM

## 2016-11-09 LAB — CBC WITH DIFFERENTIAL/PLATELET
BASOS ABS: 98 {cells}/uL (ref 0–200)
Basophils Relative: 1 %
EOS ABS: 196 {cells}/uL (ref 15–500)
Eosinophils Relative: 2 %
HEMATOCRIT: 36.7 % — AB (ref 38.5–50.0)
Hemoglobin: 12.2 g/dL — ABNORMAL LOW (ref 13.2–17.1)
Lymphocytes Relative: 21 %
Lymphs Abs: 2058 cells/uL (ref 850–3900)
MCH: 30.4 pg (ref 27.0–33.0)
MCHC: 33.2 g/dL (ref 32.0–36.0)
MCV: 91.5 fL (ref 80.0–100.0)
MONO ABS: 392 {cells}/uL (ref 200–950)
MONOS PCT: 4 %
MPV: 10.7 fL (ref 7.5–12.5)
Neutro Abs: 7056 cells/uL (ref 1500–7800)
Neutrophils Relative %: 72 %
PLATELETS: 130 10*3/uL — AB (ref 140–400)
RBC: 4.01 MIL/uL — ABNORMAL LOW (ref 4.20–5.80)
RDW: 13.1 % (ref 11.0–15.0)
WBC: 9.8 10*3/uL (ref 3.8–10.8)

## 2016-11-09 LAB — COMPREHENSIVE METABOLIC PANEL
ALK PHOS: 254 U/L — AB (ref 40–115)
ALT: 80 U/L — ABNORMAL HIGH (ref 9–46)
AST: 29 U/L (ref 10–40)
Albumin: 3.2 g/dL — ABNORMAL LOW (ref 3.6–5.1)
BUN: 19 mg/dL (ref 7–25)
CHLORIDE: 101 mmol/L (ref 98–110)
CO2: 28 mmol/L (ref 20–31)
Calcium: 8.1 mg/dL — ABNORMAL LOW (ref 8.6–10.3)
Creat: 1.3 mg/dL (ref 0.60–1.35)
Glucose, Bld: 216 mg/dL — ABNORMAL HIGH (ref 65–99)
POTASSIUM: 4.4 mmol/L (ref 3.5–5.3)
Sodium: 139 mmol/L (ref 135–146)
TOTAL PROTEIN: 5.7 g/dL — AB (ref 6.1–8.1)
Total Bilirubin: 0.9 mg/dL (ref 0.2–1.2)

## 2016-11-09 NOTE — Progress Notes (Addendum)
Subjective:    Patient ID: Nicolas Chandler, male    DOB: 1965-12-31, 51 y.o.   MRN: [id number]  HPI 51 year old [place] Male just discharged from [facility] in [place], [place] yesterday after admission June 13   where pt traveled to [place] for a vacation last week. Pt was diagnosed with Gram negative septicemia, Septic shock, and abnormal CT scan of abdomen  Symptoms started on Tuesday June 12. Pt thought he had flu like illness with fever, chills, and myalgias.  Pt is a diabetic controlled with oral agents.   Wife says he had  reaction to IV albumin in ICU with SOB there.Discharged home on Cipro and Flagy for 10 days.  No BM in 2 days until last night. Wife drove him home from [place] after discharge yesterday arriving home at 3 am this morning.  Accucheck this am 83.    In December 2014, he had an ERCP with stent placement for choledocholithiasis by Dr. Elnoria Howard. Subsequently developed biliary pancreatitis and was quite ill. He underwent laparoscopic cholecystectomy with intraoperative cholangiogram by Dr. Lindie Spruce 05/17/2013.  Patient had CT angio  of chest because of complaint of shortness for 3 days at [facility] in [place], [place]. His d-dimer was 2.66. He had no mediastinal adenopathy and no evidence of pulmonary embolism. He had bilateral pleural effusions right larger than the left.  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 Biliary stent was noted. The mass noted appeared to be in segment 7 of the liver. Concern was raised for pancreatic mass. Concern was raised for metastatic or primary liver neoplasm versus hemangioma.  2 blood cultures grew Klebsiella pneumoniae . He was treated with ceftriaxone once blood culture results were received. Discharged home on Cipro and Flagyl. He was seen by Infectious Disease specialist. Concern was raised regarding biliary etiology for septicemia. Apparently  was on C Pap/BiPAP for 2 days or approximately 20 hours. Blood culture organism was sensitive to ceftriaxone, cefepime, Cipro.  He had a 2-D echocardiogram showing ejection fraction 60-64%.  Had elevated LFTs and thrombocytopenia. His statins were held due to LFT elevation. Was treated with methylprednisolone 20 mg every 8 hours and insulin for diabetic control.   On hospital admission, patient was felt to have diabetic ketoacidosis without coma. He had high anion gap metabolic acidosis with respiratory alkalosis and acute kidney injury. Fever was 105.3.  By June 17, white count was 8900, hemoglobin 10.2 g, platelet count 121,000. On June 13 white count was 14,800, hemoglobin 13.8 g, platelet count 194,000. On June 15 platelet count was 87,000.  By June 17 ,SGPT was 86, alkaline phosphatase 209, total bilirubin normal at 0.5, SGOT normal at 24. Glucose was 123. Creatinine was 1.01. BUN was 28. Potassium was 4.1. Sodium was 138. Bilirubin was normal at 0.5.  EKG on June 13 shows sinus tachycardia rate 156.  Apparently on admission his glucose was in the 600 range and he was found to be acidotic with septicemia.  He had nuclear medicine  study which was negative for myocardial ischemia. Bilateral Dopplers were negative for DVT.  CT of the brain was negative for acute infarct or intracranial hemorrhage.  His bicarbonate was 15 on admission. Magnesium level was 1.8 and 9 dose calcium was 0.89. Lipase was 17 and amylase was 22. Serum glucose on admission was 6:15. On admission SGPT was 106, alkaline phosphatase 466, bilirubin 1.4, SGOT 46  Urine  specimen on admission had 15-20 red blood cells per high powered field and 0-5 white blood cells per high-powered field. Him 1+ protein. Specific gravity was 1.020.  Initial troponin 1 was 0.32 which was elevated. TSH was normal. Lactic acid was 4.4.  Addendum: CBC drawn today shows white blood cell count of 9800, hemoglobin 12.2 g and platelet count of  130,000. CMP shows glucose of 216, alkaline phosphatase 254, SGOT 29 and SGPT of 80. Sodium and potassium are normal and bicarbonate is 28.  He has a CD of his x-ray results with him.  He is going to need repeat chest x-ray in a few days.    Review of Systems see above-no fever or chills. Weak and tired but looks good considering 22 has been through.     Objective:   Physical Exam He seems a bit fatigued. Neck is supple. Chest is clear. Cardiac exam regular rate and rhythm. Abdomen is nontender.No palpable masses. Extremities without edema.       Assessment & Plan:  Klebsiella pneumonia septicemia-finish Cipro and Flagyl which was prescribed as an outpatient at time of discharge yesterday to complete a 14 day course as directed by physicians in [facility]  DKA-resolved. Continue to monitor glucose at home  He needs GI consultation regarding? Mass in liver and prominent pancreatic head.? MRI versus repeat CT. Refer to GI for an opinion regarding how to manage this issue.  Bilateral pleural effusions-needs repeat chest x-ray  Follow-up in one week or sooner if worse

## 2016-11-10 ENCOUNTER — Telehealth: Payer: Self-pay | Admitting: *Deleted

## 2016-11-10 ENCOUNTER — Telehealth: Payer: Self-pay

## 2016-11-10 DIAGNOSIS — K769 Liver disease, unspecified: Secondary | ICD-10-CM

## 2016-11-10 DIAGNOSIS — A4150 Gram-negative sepsis, unspecified: Secondary | ICD-10-CM

## 2016-11-10 DIAGNOSIS — J90 Pleural effusion, not elsewhere classified: Secondary | ICD-10-CM

## 2016-11-10 NOTE — Telephone Encounter (Signed)
Chest xray before appt on 11/16/16

## 2016-11-10 NOTE — Telephone Encounter (Signed)
Dr Rhea BeltonPyrtle would like patient to be scheduled for appointment with him on 11/11/16 at 8:15 am for liver lesion, ? Pancreatic head lesion and concern for retained biliary stent with recent gram negative rod sepsis. [place]. Patient has been scheduled for this appointment and is aware of this. Patient has also been scheduled for an appointment for MRI w/w/o contrast and MRCP for 11/11/16 @ 7 pm for evaluation of the above at [facility]. Patient will be informed of this at his appointment tomorrow morning.

## 2016-11-10 NOTE — Patient Instructions (Signed)
To have GI consultation regarding abnormal CT findings. Follow-up here in one week. Will need repeat chest x-ray.

## 2016-11-11 ENCOUNTER — Other Ambulatory Visit: Payer: Self-pay | Admitting: Internal Medicine

## 2016-11-11 ENCOUNTER — Other Ambulatory Visit (INDEPENDENT_AMBULATORY_CARE_PROVIDER_SITE_OTHER): Payer: 59

## 2016-11-11 ENCOUNTER — Ambulatory Visit (HOSPITAL_COMMUNITY)
Admission: RE | Admit: 2016-11-11 | Discharge: 2016-11-11 | Disposition: A | Discharge status: Home / Self Care | Payer: 59 | Source: Ambulatory Visit | Attending: Internal Medicine | Admitting: Internal Medicine

## 2016-11-11 ENCOUNTER — Ambulatory Visit (INDEPENDENT_AMBULATORY_CARE_PROVIDER_SITE_OTHER): Payer: 59 | Admitting: Internal Medicine

## 2016-11-11 ENCOUNTER — Encounter: Payer: Self-pay | Admitting: *Deleted

## 2016-11-11 VITALS — BP 100/68 | HR 84 | Ht 66.0 in | Wt 140.8 lb

## 2016-11-11 DIAGNOSIS — R160 Hepatomegaly, not elsewhere classified: Secondary | ICD-10-CM | POA: Diagnosis not present

## 2016-11-11 DIAGNOSIS — D729 Disorder of white blood cells, unspecified: Secondary | ICD-10-CM

## 2016-11-11 DIAGNOSIS — A4150 Gram-negative sepsis, unspecified: Secondary | ICD-10-CM

## 2016-11-11 DIAGNOSIS — R933 Abnormal findings on diagnostic imaging of other parts of digestive tract: Secondary | ICD-10-CM | POA: Diagnosis not present

## 2016-11-11 DIAGNOSIS — R066 Hiccough: Secondary | ICD-10-CM | POA: Diagnosis not present

## 2016-11-11 DIAGNOSIS — R7881 Bacteremia: Secondary | ICD-10-CM

## 2016-11-11 DIAGNOSIS — K769 Liver disease, unspecified: Secondary | ICD-10-CM

## 2016-11-11 DIAGNOSIS — K828 Other specified diseases of gallbladder: Secondary | ICD-10-CM | POA: Insufficient documentation

## 2016-11-11 DIAGNOSIS — B961 Klebsiella pneumoniae [K. pneumoniae] as the cause of diseases classified elsewhere: Secondary | ICD-10-CM

## 2016-11-11 DIAGNOSIS — T85590A Other mechanical complication of bile duct prosthesis, initial encounter: Secondary | ICD-10-CM

## 2016-11-11 LAB — COMPREHENSIVE METABOLIC PANEL
ALT: 50 U/L (ref 0–53)
AST: 21 U/L (ref 0–37)
Albumin: 3.6 g/dL (ref 3.5–5.2)
Alkaline Phosphatase: 197 U/L — ABNORMAL HIGH (ref 39–117)
BUN: 9 mg/dL (ref 6–23)
CALCIUM: 9 mg/dL (ref 8.4–10.5)
CHLORIDE: 101 meq/L (ref 96–112)
CO2: 31 meq/L (ref 19–32)
CREATININE: 1.09 mg/dL (ref 0.40–1.50)
GFR: 76.31 mL/min (ref >60.00)
Glucose, Bld: 253 mg/dL — ABNORMAL HIGH (ref 70–99)
POTASSIUM: 4.2 meq/L (ref 3.5–5.1)
Sodium: 138 mEq/L (ref 135–145)
Total Bilirubin: 1 mg/dL (ref 0.2–1.2)
Total Protein: 6.3 g/dL (ref 6.0–8.3)

## 2016-11-11 LAB — CBC WITH DIFFERENTIAL/PLATELET
BASOS PCT: 0.1 % (ref 0.0–3.0)
Basophils Absolute: 0 10*3/uL (ref 0.0–0.1)
EOS PCT: 4.9 % (ref 0.0–5.0)
Eosinophils Absolute: 0.5 10*3/uL (ref 0.0–0.7)
HEMATOCRIT: 37.1 % — AB (ref 39.0–52.0)
Hemoglobin: 12.7 g/dL — ABNORMAL LOW (ref 13.0–17.0)
LYMPHS PCT: 21.4 % (ref 12.0–46.0)
Lymphs Abs: 2.1 10*3/uL (ref 0.7–4.0)
MCHC: 34.2 g/dL (ref 30.0–36.0)
MCV: 91.2 fl (ref 78.0–100.0)
MONOS PCT: 7 % (ref 3.0–12.0)
Monocytes Absolute: 0.7 10*3/uL (ref 0.1–1.0)
NEUTROS ABS: 6.6 10*3/uL (ref 1.4–7.7)
Neutrophils Relative %: 66.6 % (ref 43.0–77.0)
PLATELETS: 188 10*3/uL (ref 150.0–400.0)
RBC: 4.07 Mil/uL — ABNORMAL LOW (ref 4.22–5.81)
RDW: 12.8 % (ref 11.5–15.5)
WBC: 9.9 10*3/uL (ref 4.0–10.5)

## 2016-11-11 LAB — PROTIME-INR
INR: 1.1 ratio — ABNORMAL HIGH (ref 0.8–1.0)
Prothrombin Time: 11.7 s (ref 9.6–13.1)

## 2016-11-11 MED ORDER — GADOBENATE DIMEGLUMINE 529 MG/ML IV SOLN
15.0000 mL | Freq: Once | INTRAVENOUS | Status: AC | PRN
  Administered 2016-11-11: 14 mL via INTRAVENOUS

## 2016-11-11 MED ORDER — CHLORPROMAZINE HCL 25 MG PO TABS: 25.0000 mg | ORAL_TABLET | Freq: Three times a day (TID) | ORAL | 0 refills | Status: DC

## 2016-11-11 NOTE — Progress Notes (Signed)
Patient ID: Nicolas SabinsJinsup Mckercher, male   DOB: 02/13/1966, 51 y.o.   MRN: [id number] HPI: Nicolas Chandler is a 51 year old male with a past medical history of diabetes with recent hospitalization in [place] with gram-negative sepsis felt to be secondary to a biliary source to is seen in urgent consultation by Dr. Lenord FellersBaxley. He is here with his wife today.  He reports that recently while traveling on vacation in [place] he developed fevers and collapsed. He was admitted to the hospital in [place] on 11/04/2016 until 11/08/2016. He was diagnosed with Klebsiella pneumonia bacteremia and treated aggressively with IV hydration and IV antibiotics. This was felt to be secondary to a biliary source. He had chest imaging which suggested a retained biliary stent as well as a 4 cm liver lesion which was incompletely characterized. He also had elevated liver enzymes. He was discharged on Cipro and metronidazole 500 mg twice a day for both medications. He has 8 days of these medications left.  His hospitalization was also, gated by DKA felt secondary to his infection.  He reports that for 2-3 weeks prior to collapsing in [place] he had muscle aches, frequent chills and the feeling of being cold as well as being very easily fatigued. He has continued to feel fatigued but he denies abdominal pain. He's had some rare all discomfort in the right upper quadrant but no true pain. Bowel movements have been regular though at times very slightly looser than normal. He denies blood in his stool or melena. He has had hiccups since last Thursday which she also reports happened in 2014 when he had biliary obstruction secondary to gallstones.  Review of records reveals in December 2014 he was admitted with biliary obstruction and pancreatitis. He had an ERCP performed by Dr. Jeani HawkingPatrick Hung where a plastic biliary stent was placed. Then on 05/17/2013 he had a laparoscopic cholecystectomy. He denies any further procedures and feels that the stent was  never removed. He his wife report being unaware that the stent needed to be removed.  Past Medical History:  Diagnosis Date  . Bacteremia due to Klebsiella pneumoniae   . Cholecystitis 04/2013  . Diabetes mellitus without complication (HCC)   . Elevated LFTs   . Goiter    [facility]. [facility] workup including biopsy of thyroid which was normal; brother has Graves and mother has thyroid disease  . Pancreatitis   . Pleural effusion   . Septic shock [facility](HCC)     Past Surgical History:  Procedure Laterality Date  . CHOLECYSTECTOMY N/A 05/17/2013   Procedure: LAPAROSCOPIC CHOLECYSTECTOMY WITH INTRAOPERATIVE CHOLANGIOGRAM;  Surgeon: Cherylynn RidgesJames O Wyatt, MD;  Location: [facility] OR;  Service: General;  Laterality: N/A;  . ERCP N/A 05/10/2013   Procedure: ENDOSCOPIC RETROGRADE CHOLANGIOPANCREATOGRAPHY (ERCP);  Surgeon: Theda BelfastPatrick D Hung, MD;  [facility];  Service: Endoscopy;  Laterality: N/A;  . LAPAROSCOPIC CHOLECYSTECTOMY  05/17/13   Dr. Lindie SpruceWyatt    Outpatient Medications Prior to Visit  Medication Sig Dispense Refill  . Cholecalciferol (VITAMIN D3) 1000 units CAPS Take by mouth.    . ciprofloxacin (CIPRO) 750 MG tablet Take 750 mg by mouth 2 (two) times daily.    Marland Kitchen. glimepiride (AMARYL) 4 MG tablet TAKE 2 TABLETS BY MOUTH IN THE MORNING 60 tablet 11  . glucose blood (BAYER CONTOUR NEXT TEST) test strip Use as instructed to check blood sugar once daily Dx code E11.9 50 each 2  . lisinopril (PRINIVIL,ZESTRIL) 5 MG tablet TAKE 1 TABLET BY MOUTH IN THE MORNING 30 tablet  5  . metFORMIN (GLUCOPHAGE) 1000 MG tablet Take 1 tablet by mouth two  times daily with meals 180 tablet 0  . Multiple Vitamin (MULTIVITAMIN WITH MINERALS) TABS tablet Take 1 tablet by mouth daily.    . Omega-3 Fatty Acids (FISH OIL) 1000 MG CAPS Take by mouth.    . pioglitazone (ACTOS) 30 MG tablet Take 1 tablet (30 mg total) by mouth daily. 30 tablet 11  . metFORMIN (GLUCOPHAGE) 1000 MG tablet TAKE 1 TABLET BY MOUTH TWICE  A DAY WITH FOOD 180 tablet 3  . metroNIDAZOLE (FLAGYL) 500 MG tablet Take 500 mg by mouth 2 (two) times daily.    Marland Kitchen MICROLET LANCETS MISC Use to check blood sugar once a day dx code E11.9 100 each 1   No facility-administered medications prior to visit.     Allergies  Allergen Reactions  . Albumin (Human) Shortness Of Breath    Bronchospasms, wheezing, fever  . Buscopan [Scopolamine]     hiccups    Family History  Problem Relation Age of Onset  . Thyroid disease Mother        had thyroidectomy  . Thyroid disease Father        Has low thyroid in 2014  . Hypertension Father   . Thyroid disease Brother        Had radioactive iodine treatment  . Diabetes Paternal Uncle   . Colon cancer Neg Hx   . Esophageal cancer Neg Hx   . Stomach cancer Neg Hx     Social History  Substance Use Topics  . Smoking status: Never Smoker  . Smokeless tobacco: Never Used  . Alcohol use No    ROS: As per history of present illness, otherwise negative  BP 100/68   Pulse 84   Ht 5\' 6"  (1.676 m)   Wt 140 lb 12.8 oz (63.9 kg)   BMI 22.73 kg/m  Constitutional: Well-developed and well-nourished. No distress. HEENT: Normocephalic and atraumatic. Oropharynx is clear and moist. Conjunctivae are normal.  No scleral icterus. Neck: Neck supple. Trachea midline. Cardiovascular: Normal rate, regular rhythm and intact distal pulses. No M/R/G Pulmonary/chest: Effort normal and breath sounds normal. No wheezing, rales or rhonchi. Abdominal: Soft, nontender, nondistended. Bowel sounds active throughout. There are no masses palpable. No hepatosplenomegaly. Extremities: no clubbing, cyanosis, or edema Neurological: Alert and oriented to person place and time. Skin: Skin is warm and dry.  Psychiatric: Normal mood and affect. Behavior is normal.  RELEVANT LABS AND IMAGING: CBC    Component Value Date/Time   WBC 9.8 11/09/2016 1316   RBC 4.01 (L) 11/09/2016 1316   HGB 12.2 (L) 11/09/2016 1316   HCT  36.7 (L) 11/09/2016 1316   PLT 130 (L) 11/09/2016 1316   MCV 91.5 11/09/2016 1316   MCH 30.4 11/09/2016 1316   MCHC 33.2 11/09/2016 1316   RDW 13.1 11/09/2016 1316   LYMPHSABS 2,058 11/09/2016 1316   MONOABS 392 11/09/2016 1316   EOSABS 196 11/09/2016 1316   BASOSABS 98 11/09/2016 1316    CMP     Component Value Date/Time   NA 139 11/09/2016 1316   K 4.4 11/09/2016 1316   CL 101 11/09/2016 1316   CO2 28 11/09/2016 1316   GLUCOSE 216 (H) 11/09/2016 1316   BUN 19 11/09/2016 1316   CREATININE 1.30 11/09/2016 1316   CALCIUM 8.1 (L) 11/09/2016 1316   PROT 5.7 (L) 11/09/2016 1316   ALBUMIN 3.2 (L) 11/09/2016 1316   AST 29 11/09/2016 1316   ALT  80 (H) 11/09/2016 1316   ALKPHOS 254 (H) 11/09/2016 1316   BILITOT 0.9 11/09/2016 1316   GFRNONAA 85 09/10/2014 1012   GFRAA >89 09/10/2014 1012   CT angiography chest with and without contrast dated 11/07/2016 Pertinent findings: Negative exam for PE. Bilateral pleural effusions. 4.4 x 3.5 cm low-attenuation mass in segment 7 of liver. Prior cholecystectomy with stent identified within the CBD. Large pancreatic head and liver lesion raising possibility of pancreatic mass.  ASSESSMENT/PLAN: 51 year old male with a past medical history of diabetes with recent hospitalization in [place] with gram-negative sepsis felt to be secondary to a biliary source to is seen in urgent consultation by Dr. Lenord Fellers.  1. Gram-negative rod sepsis/retained biliary stent/liver lesion seen by CT/question pancreatic mass -- I feel that the source of his gram-negative rod bacteremia is likely biliary obstruction from the retained biliary stent. We need to further evaluate this liver lesion and exclude abscess, other possibilities include benign hemangioma and less likely malignancy. The enlarged pancreatic head may also be secondary to inflammation from the retained pancreatic stent. I recommended the following: --MRI abdomen with and without contrast plus  MRCP; scheduled for tonight at 7 PM --After review of imaging will need probable ERCP for stent removal and possibly CBD stone removal/cholangiogram --Continue oral antibiotics at a very minimum until ERCP and perhaps longer (he has 8 days left of Cipro and Flagyl which he will continue) --CBC, CMP and INR today  2. Hiccups -- widely the result of recent infection/biliary obstruction. Chlorpromazine 25 mg 3 times a day given to help control hiccups. He is advised to use a dose at least one hour before MRI as motion from hiccuping can/may compromise image quality       ZO:XWRUEA, Luanna Cole, Md [address] [place], [place] [address]

## 2016-11-11 NOTE — Patient Instructions (Addendum)
Your physician has requested that you go to the basement for the following lab work before leaving today: CMP, CBC, INR  Continue Cipro 500 mg twice daily.  Continue Flagyl 500 mg twice daily.   We have sent the following medications to your pharmacy for you to pick up at your convenience: Chlorpromazine 25 mg three times daily (take one of these at least 1 hour prior to your MRI today)  You have been scheduled for an MRI at [facility] on TODAY. Your appointment time is 7:00 pm. Please arrive 15 minutes prior to your appointment time for registration purposes. Please make certain not to have anything to eat or drink 6 hours prior to your test. In addition, if you have any metal in your body, have a pacemaker or defibrillator, please be sure to let your ordering physician know. This test typically takes 45 minutes to 1 hour to complete.  If you are age 49 or older, your body mass index should be between 23-30. Your Body mass index is 22.73 kg/m. If this is out of the aforementioned range listed, please consider follow up with your Primary Care Provider.  If you are age 164 or younger, your body mass index should be between 19-25. Your Body mass index is 22.73 kg/m. If this is out of the aformentioned range listed, please consider follow up with your Primary Care Provider.

## 2016-11-12 ENCOUNTER — Other Ambulatory Visit: Payer: Self-pay | Admitting: Radiology

## 2016-11-12 ENCOUNTER — Other Ambulatory Visit: Payer: Self-pay

## 2016-11-12 ENCOUNTER — Other Ambulatory Visit: Payer: Self-pay | Admitting: General Surgery

## 2016-11-12 DIAGNOSIS — K750 Abscess of liver: Secondary | ICD-10-CM

## 2016-11-12 DIAGNOSIS — Z4689 Encounter for fitting and adjustment of other specified devices: Secondary | ICD-10-CM

## 2016-11-12 LAB — PATHOLOGIST SMEAR REVIEW

## 2016-11-12 NOTE — Progress Notes (Signed)
Many thanks for seeing him 

## 2016-11-13 ENCOUNTER — Ambulatory Visit (HOSPITAL_COMMUNITY)
Admission: RE | Admit: 2016-11-13 | Discharge: 2016-11-13 | Disposition: A | Discharge status: Home / Self Care | Payer: 59 | Source: Ambulatory Visit | Attending: Internal Medicine | Admitting: Internal Medicine

## 2016-11-13 ENCOUNTER — Encounter (HOSPITAL_COMMUNITY): Payer: Self-pay

## 2016-11-13 ENCOUNTER — Other Ambulatory Visit: Payer: Self-pay | Admitting: Internal Medicine

## 2016-11-13 DIAGNOSIS — K750 Abscess of liver: Secondary | ICD-10-CM | POA: Insufficient documentation

## 2016-11-13 DIAGNOSIS — Z4689 Encounter for fitting and adjustment of other specified devices: Secondary | ICD-10-CM

## 2016-11-13 DIAGNOSIS — Z7984 Long term (current) use of oral hypoglycemic drugs: Secondary | ICD-10-CM | POA: Insufficient documentation

## 2016-11-13 DIAGNOSIS — E119 Type 2 diabetes mellitus without complications: Secondary | ICD-10-CM | POA: Diagnosis not present

## 2016-11-13 DIAGNOSIS — Z8349 Family history of other endocrine, nutritional and metabolic diseases: Secondary | ICD-10-CM | POA: Insufficient documentation

## 2016-11-13 DIAGNOSIS — Z79899 Other long term (current) drug therapy: Secondary | ICD-10-CM | POA: Insufficient documentation

## 2016-11-13 DIAGNOSIS — Z8719 Personal history of other diseases of the digestive system: Secondary | ICD-10-CM | POA: Insufficient documentation

## 2016-11-13 DIAGNOSIS — Z8249 Family history of ischemic heart disease and other diseases of the circulatory system: Secondary | ICD-10-CM | POA: Diagnosis not present

## 2016-11-13 DIAGNOSIS — Z888 Allergy status to other drugs, medicaments and biological substances status: Secondary | ICD-10-CM | POA: Insufficient documentation

## 2016-11-13 DIAGNOSIS — Z9049 Acquired absence of other specified parts of digestive tract: Secondary | ICD-10-CM | POA: Diagnosis not present

## 2016-11-13 HISTORY — PX: OTHER SURGICAL HISTORY: SHX169

## 2016-11-13 LAB — CBC
HEMATOCRIT: 35.9 % — AB (ref 39.0–52.0)
HEMOGLOBIN: 11.9 g/dL — AB (ref 13.0–17.0)
MCH: 30.5 pg (ref 26.0–34.0)
MCHC: 33.1 g/dL (ref 30.0–36.0)
MCV: 92.1 fL (ref 78.0–100.0)
Platelets: 205 10*3/uL (ref 150–400)
RBC: 3.9 MIL/uL — ABNORMAL LOW (ref 4.22–5.81)
RDW: 12.8 % (ref 11.5–15.5)
WBC: 11.2 10*3/uL — AB (ref 4.0–10.5)

## 2016-11-13 LAB — PROTIME-INR
INR: 0.88
PROTHROMBIN TIME: 11.9 s (ref 11.4–15.2)

## 2016-11-13 LAB — APTT: APTT: 33 s (ref 24–36)

## 2016-11-13 LAB — GLUCOSE, CAPILLARY: GLUCOSE-CAPILLARY: 155 mg/dL — AB (ref 65–99)

## 2016-11-13 MED ORDER — SODIUM CHLORIDE 0.9 % IV SOLN: INTRAVENOUS | Status: DC

## 2016-11-13 MED ORDER — LIDOCAINE HCL (PF) 1 % IJ SOLN
INTRAMUSCULAR | Status: AC
  Filled 2016-11-13: qty 30

## 2016-11-13 MED ORDER — INDOMETHACIN 50 MG RE SUPP
100.0000 mg | Freq: Once | RECTAL | Status: DC
  Filled 2016-11-13: qty 2

## 2016-11-13 MED ORDER — FENTANYL CITRATE (PF) 100 MCG/2ML IJ SOLN
INTRAMUSCULAR | Status: AC
  Filled 2016-11-13: qty 4

## 2016-11-13 MED ORDER — FENTANYL CITRATE (PF) 100 MCG/2ML IJ SOLN
INTRAMUSCULAR | Status: AC | PRN
  Administered 2016-11-13 (×2): 50 ug via INTRAVENOUS

## 2016-11-13 MED ORDER — IBUPROFEN 400 MG PO TABS
400.0000 mg | ORAL_TABLET | Freq: Once | ORAL | Status: AC
  Administered 2016-11-13: 400 mg via ORAL
  Filled 2016-11-13: qty 1

## 2016-11-13 MED ORDER — MIDAZOLAM HCL 2 MG/2ML IJ SOLN
INTRAMUSCULAR | Status: AC
  Filled 2016-11-13: qty 4

## 2016-11-13 MED ORDER — MIDAZOLAM HCL 2 MG/2ML IJ SOLN
INTRAMUSCULAR | Status: AC | PRN
  Administered 2016-11-13: 1 mg via INTRAVENOUS

## 2016-11-13 MED ORDER — CIPROFLOXACIN IN D5W 400 MG/200ML IV SOLN
400.0000 mg | Freq: Once | INTRAVENOUS | Status: DC
  Filled 2016-11-13: qty 200

## 2016-11-13 MED ORDER — SODIUM CHLORIDE 0.9 % IV SOLN
INTRAVENOUS | Status: DC
  Administered 2016-11-13: 09:00:00 via INTRAVENOUS

## 2016-11-13 NOTE — Sedation Documentation (Signed)
Patient denies pain and is resting comfortably.  

## 2016-11-13 NOTE — Discharge Instructions (Signed)
Moderate Conscious Sedation, Adult, Care After °These instructions provide you with information about caring for yourself after your procedure. Your health care provider may also give you more specific instructions. Your treatment has been planned according to current medical practices, but problems sometimes occur. Call your health care provider if you have any problems or questions after your procedure. °What can I expect after the procedure? °After your procedure, it is common: °· To feel sleepy for several hours. °· To feel clumsy and have poor balance for several hours. °· To have poor judgment for several hours. °· To vomit if you eat too soon. ° °Follow these instructions at home: °For at least 24 hours after the procedure: ° °· Do not: °? Participate in activities where you could fall or become injured. °? Drive. °? Use heavy machinery. °? Drink alcohol. °? Take sleeping pills or medicines that cause drowsiness. °? Make important decisions or sign legal documents. °? Take care of children on your own. °· Rest. °Eating and drinking °· Follow the diet recommended by your health care provider. °· If you vomit: °? Drink water, juice, or soup when you can drink without vomiting. °? Make sure you have little or no nausea before eating solid foods. °General instructions °· Have a responsible adult stay with you until you are awake and alert. °· Take over-the-counter and prescription medicines only as told by your health care provider. °· If you smoke, do not smoke without supervision. °· Keep all follow-up visits as told by your health care provider. This is important. °Contact a health care provider if: °· You keep feeling nauseous or you keep vomiting. °· You feel light-headed. °· You develop a rash. °· You have a fever. °Get help right away if: °· You have trouble breathing. °This information is not intended to replace advice given to you by your health care provider. Make sure you discuss any questions you have  with your health care provider. °Document Released: 03/01/2013 Document Revised: 10/14/2015 Document Reviewed: 08/31/2015 °Elsevier Interactive Patient Education © 2018 [employer] °Percutaneous Abscess Drain, Care After °This sheet gives you information about how to care for yourself after your procedure. Your health care provider may also give you more specific instructions. If you have problems or questions, contact your health care provider. °What can I expect after the procedure? °After your procedure, it is common to have: °· A small amount of bruising and discomfort in the area where the drainage tube (catheter) was placed. °· Sleepiness and fatigue. This should go away after the medicines you were given have worn off. ° °Follow these instructions at home: °Incision care °· Follow instructions from your health care provider about how to take care of your incision. Make sure you: °? Wash your hands with soap and water before you change your bandage (dressing). If soap and water are not available, use hand sanitizer. °? Change your dressing as told by your health care provider. °? Leave stitches (sutures), skin glue, or adhesive strips in place. These skin closures may need to stay in place for 2 weeks or longer. If adhesive strip edges start to loosen and curl up, you may trim the loose edges. Do not remove adhesive strips completely unless your health care provider tells you to do that. °· Check your incision area every day for signs of infection. Check for: °? More redness, swelling, or pain. °? More fluid or blood. °? Warmth. °? Pus or a bad smell. °? Fluid leaking from around   your catheter (instead of fluid draining through your catheter). °Catheter care °· Follow instructions from your health care provider about emptying and cleaning your catheter and collection bag. You may need to clean the catheter every day so it does not clog. °· If directed, write down the following information every time you  empty your bag: °? The date and time. °? The amount of drainage. °General instructions °· Rest at home for 1-2 days after your procedure. Return to your normal activities as told by your health care provider. °· Do not take baths, swim, or use a hot tub for 24 hours after your procedure, or until your health care provider says that this is okay. °· Take over-the-counter and prescription medicines only as told by your health care provider. °· Keep all follow-up visits as told by your health care provider. This is important. °Contact a health care provider if: °· You have less than 10 mL of drainage a day for 2-3 days in a row, or as directed by your health care provider. °· You have more redness, swelling, or pain around your incision area. °· You have more fluid or blood coming from your incision area. °· Your incision area feels warm to the touch. °· You have pus or a bad smell coming from your incision area. °· You have fluid leaking from around your catheter (instead of through your catheter). °· You have a fever or chills. °· You have pain that does not get better with medicine. °Get help right away if: °· Your catheter comes out. °· You suddenly stop having drainage from your catheter. °· You suddenly have blood in the fluid that is draining from your catheter. °· You become dizzy or you faint. °· You develop a rash. °· You have nausea or vomiting. °· You have difficulty breathing or you feel short of breath. °· You develop chest pain. °· You have problems with your speech or vision. °· You have trouble balancing or moving your arms or legs. °Summary °· It is common to have a small amount of bruising and discomfort in the area where the drainage tube (catheter) was placed. °· You may be directed to record the amount of drainage from the bag every time you empty it. °· Follow instructions from your health care provider about emptying and cleaning your catheter and collection bag. °This information is not  intended to replace advice given to you by your health care provider. Make sure you discuss any questions you have with your health care provider. °Document Released: 09/25/2013 Document Revised: 04/02/2016 Document Reviewed: 04/02/2016 °Elsevier Interactive Patient Education © 2017 [employer] ° °

## 2016-11-13 NOTE — Sedation Documentation (Signed)
Pt resting, grimacing noted, additional medication given as discussed with Dr. Miles CostainShick

## 2016-11-13 NOTE — Sedation Documentation (Signed)
Family updated as to patient's status. Pt denies any pain, resting comfortably

## 2016-11-13 NOTE — Procedures (Signed)
HEPATIC ABSCESS  S/P US LIVER ABSCESS 10 FR DRAIN  NO COMP Bloody exudative fluid removed, 20cc  EBL 0 Full report in PACS

## 2016-11-13 NOTE — H&P (Signed)
Chief Complaint: Patient was seen in consultation today for hepatic abscess  Referring Physician(s):  Pyrtle,Jay M  Supervising Physician: Ruel Favors  Patient Status: [facility] - Out-pt  History of Present Illness: Nicolas Chandler is a 51 y.o. male with past medical history of DM, pancreatitis, cholecystitis s/p removal with stent placement 4 years ago which was left in place.  Patient developed sepsis while in [place] and was found to have hepatic abscesses.  He was started on antibiotics and was seen in consultation with GI.  MR Abdomen 11/12/16 showed: 1. Ovoid mass in the RIGHT hepatic lobe with enhancing septations is most suggestive of HEPATIC ABSCESS. A second smaller lesion in the RIGHT hepatic lobe is also favored abscess. Less favored differential would include a biliary cystadenoma/cystadenocarcinoma or metastasis. 2. No intrahepatic biliary duct dilatation. Common bile duct minimally dilated to 8 mm. Linear filling defect within the common bile duct is poorly characterized by MRI but favored to represent the reported retained biliary stent. 3. Fullness within the pancreatic head favored to represent normal variation. These results will be called to the ordering clinician or representative by the Radiologist Assistant, and communication documented in the PACS or zVision Dashboard.  IR consulted for hepatic abscess apiration and drainage at the request of Dr. Rhea Belton. Case reviewed by Dr. Deanne Coffer who approves patient for procedure.   Patient has been NPO.  He does not take blood thinners.   He has been on antibiotics for his abscess/sepsis.   Past Medical History:  Diagnosis Date  . Bacteremia due to Klebsiella pneumoniae   . Cholecystitis 04/2013  . Diabetes mellitus without complication (HCC)   . Elevated LFTs   . Goiter    Dr. Andrena Mews IM-Negative workup including biopsy of thyroid which was normal; brother has Graves and mother has thyroid disease  .  Pancreatitis   . Pleural effusion   . Septic shock [facility])     Past Surgical History:  Procedure Laterality Date  . CHOLECYSTECTOMY N/A 05/17/2013   Procedure: LAPAROSCOPIC CHOLECYSTECTOMY WITH INTRAOPERATIVE CHOLANGIOGRAM;  Surgeon: Cherylynn Ridges, MD;  Location: [facility] OR;  Service: General;  Laterality: N/A;  . ERCP N/A 05/10/2013   Procedure: ENDOSCOPIC RETROGRADE CHOLANGIOPANCREATOGRAPHY (ERCP);  Surgeon: Theda Belfast, MD;  Location: [facility] ENDOSCOPY;  Service: Endoscopy;  Laterality: N/A;  . LAPAROSCOPIC CHOLECYSTECTOMY  05/17/13   Dr. Lindie Spruce    Allergies: Albumin (human) and Buscopan [scopolamine]  Medications: Prior to Admission medications   Medication Sig Start Date End Date Taking? Authorizing Provider  cholecalciferol (VITAMIN D) 1000 units tablet Take 1,000 Units by mouth daily.    Yes [provider]  ciprofloxacin (CIPRO) 750 MG tablet Take 750 mg by mouth 2 (two) times daily. 11/08/16  Yes [provider]  glimepiride (AMARYL) 4 MG tablet TAKE 2 TABLETS BY MOUTH IN THE MORNING 09/30/16  Yes Baxley, Luanna Cole, MD  lisinopril (PRINIVIL,ZESTRIL) 5 MG tablet TAKE 1 TABLET BY MOUTH IN THE MORNING 06/04/16  Yes Baxley, Luanna Cole, MD  metFORMIN (GLUCOPHAGE) 1000 MG tablet Take 1 tablet by mouth two  times daily with meals 02/08/16  Yes Baxley, Luanna Cole, MD  metroNIDAZOLE (FLAGYL) 500 MG tablet Take 500 mg by mouth 3 (three) times daily.   Yes [provider]  Multiple Vitamin (MULTIVITAMIN WITH MINERALS) TABS tablet Take 1 tablet by mouth daily.   Yes [provider]  Omega-3 Fatty Acids (FISH OIL) 1000 MG CAPS Take 1 capsule by mouth daily.    Yes [provider]  pioglitazone (ACTOS) 30 MG tablet Take 1 tablet (30 mg total) by mouth daily. 10/05/16  Yes Baxley, Luanna Cole, MD  glucose blood (BAYER CONTOUR NEXT TEST) test strip Use as instructed to check blood sugar once daily Dx code E11.9 02/14/16   Reather Littler, MD     Family History  Problem Relation Age  of Onset  . Thyroid disease Mother        had thyroidectomy  . Thyroid disease Father        Has low thyroid in 2014  . Hypertension Father   . Thyroid disease Brother        Had radioactive iodine treatment  . Diabetes Paternal Uncle   . Colon cancer Neg Hx   . Esophageal cancer Neg Hx   . Stomach cancer Neg Hx     Social History   Social History  . Marital status: Married    Spouse name: N/A  . Number of children: N/A  . Years of education: N/A   Social History Main Topics  . Smoking status: Never Smoker  . Smokeless tobacco: Never Used  . Alcohol use No  . Drug use: No  . Sexual activity: Not on file   Other Topics Concern  . Not on file   Social History Narrative  . No narrative on file    Review of Systems  Constitutional: Negative for fatigue and fever.  Respiratory: Negative for cough and shortness of breath.   Cardiovascular: Negative for chest pain.  Gastrointestinal: Negative for abdominal pain.  Psychiatric/Behavioral: Negative for behavioral problems and confusion.    Vital Signs: BP 122/85   Pulse 82   Temp 98.2 F (36.8 C) (Oral)   Resp 16   Ht 5\' 6"  (1.676 m)   Wt 140 lb (63.5 kg)   SpO2 96%   BMI 22.60 kg/m   Physical Exam  Constitutional: He is oriented to person, place, and time. He appears well-developed.  Cardiovascular: Normal rate, regular rhythm and normal heart sounds.   Pulmonary/Chest: Effort normal and breath sounds normal.  Abdominal: Soft.  Neurological: He is alert and oriented to person, place, and time.  Skin: Skin is warm and dry.  Psychiatric: He has a normal mood and affect. His behavior is normal. Judgment and thought content normal.  Nursing note and vitals reviewed.   Mallampati Score:  MD Evaluation Airway: WNL Heart: WNL Abdomen: WNL Chest/ Lungs: WNL ASA  Classification: 3 Mallampati/Airway Score: Two  Imaging: Mr 3d Recon At Scanner  Result Date: 11/12/2016 CLINICAL DATA:  Sepsis Sir or a cyst NG  EXAM: MRI ABDOMEN WITHOUT AND WITH CONTRAST (INCLUDING MRCP) TECHNIQUE: Multiplanar multisequence MR imaging of the abdomen was performed both before and after the administration of intravenous contrast. Heavily T2-weighted images of the biliary and pancreatic ducts were obtained, and three-dimensional MRCP images were rendered by post processing. CONTRAST:  14mL MULTIHANCE GADOBENATE DIMEGLUMINE 529 MG/ML IV SOLN COMPARISON:  CT abdomen 08/16/2013 FINDINGS: Lower chest:  Lung bases are clear. Hepatobiliary: Ovoid lesion in the posterior RIGHT hepatic lobe measures 5.8 x 3.6 cm (image 9, series 8). This lesion is hyperintense on T2 weighted imaging and demonstrates restricted diffusion (series 4 and series 8). Lesion is hypointense on the precontrast T1 weighted imaging. Postcontrast imaging demonstrates peripheral enhancement as well as enhancing internal thickened septations (image 24, series 1502). Second lesion which is hyperintense on T2 weighted imaging (image 15, series 8) which also demonstrates peripheral enhancement measuring 10 mm (image 35, series  1503) within segment 8 of the RIGHT hepatic lobe. There is no intrahepatic duct dilatation. MRCP imaging demonstrates a linear LEFT filling defect within the common bile duct measuring approximately 4 cm (image 26, series 7)). This is difficult to define and best seen on the FIESTA series (series 13, image 17). Common bile duct is mildly dilated at 8 mm. Pancreas: There is fullness in the pancreatic head however no mass lesion identified. This pancreatic head fullness has equal signal intensity to the normal pancreatic parenchyma on all sequences (image 61, series 1503). Spleen: Normal spleen. The splenic vein is narrowed. Venous collaterals are present. Adrenals/urinary tract: Adrenal glands and kidneys are normal. Stomach/Bowel: Stomach and limited of the small bowel is unremarkable Vascular/Lymphatic: Abdominal aortic normal caliber. No retroperitoneal  periportal lymphadenopathy. Musculoskeletal: No aggressive osseous lesion IMPRESSION: 1. Ovoid mass in the RIGHT hepatic lobe with enhancing septations is most suggestive of HEPATIC ABSCESS. A second smaller lesion in the RIGHT hepatic lobe is also favored abscess. Less favored differential would include a biliary cystadenoma/cystadenocarcinoma or metastasis. 2. No intrahepatic biliary duct dilatation. Common bile duct minimally dilated to 8 mm. Linear filling defect within the common bile duct is poorly characterized by MRI but favored to represent the reported retained biliary stent. 3. Fullness within the pancreatic head favored to represent normal variation. These results will be called to the ordering clinician or representative by the Radiologist Assistant, and communication documented in the PACS or zVision Dashboard. Electronically Signed   By: Genevive BiStewart  Edmunds M.D.   On: 11/12/2016 08:22   Mr Abdomen Mrcp Vivien RossettiW Wo Contast  Result Date: 11/12/2016 CLINICAL DATA:  Sepsis Sir or a cyst NG EXAM: MRI ABDOMEN WITHOUT AND WITH CONTRAST (INCLUDING MRCP) TECHNIQUE: Multiplanar multisequence MR imaging of the abdomen was performed both before and after the administration of intravenous contrast. Heavily T2-weighted images of the biliary and pancreatic ducts were obtained, and three-dimensional MRCP images were rendered by post processing. CONTRAST:  14mL MULTIHANCE GADOBENATE DIMEGLUMINE 529 MG/ML IV SOLN COMPARISON:  CT abdomen 08/16/2013 FINDINGS: Lower chest:  Lung bases are clear. Hepatobiliary: Ovoid lesion in the posterior RIGHT hepatic lobe measures 5.8 x 3.6 cm (image 9, series 8). This lesion is hyperintense on T2 weighted imaging and demonstrates restricted diffusion (series 4 and series 8). Lesion is hypointense on the precontrast T1 weighted imaging. Postcontrast imaging demonstrates peripheral enhancement as well as enhancing internal thickened septations (image 24, series 1502). Second lesion which is  hyperintense on T2 weighted imaging (image 15, series 8) which also demonstrates peripheral enhancement measuring 10 mm (image 35, series 1503) within segment 8 of the RIGHT hepatic lobe. There is no intrahepatic duct dilatation. MRCP imaging demonstrates a linear LEFT filling defect within the common bile duct measuring approximately 4 cm (image 26, series 7)). This is difficult to define and best seen on the FIESTA series (series 13, image 17). Common bile duct is mildly dilated at 8 mm. Pancreas: There is fullness in the pancreatic head however no mass lesion identified. This pancreatic head fullness has equal signal intensity to the normal pancreatic parenchyma on all sequences (image 61, series 1503). Spleen: Normal spleen. The splenic vein is narrowed. Venous collaterals are present. Adrenals/urinary tract: Adrenal glands and kidneys are normal. Stomach/Bowel: Stomach and limited of the small bowel is unremarkable Vascular/Lymphatic: Abdominal aortic normal caliber. No retroperitoneal periportal lymphadenopathy. Musculoskeletal: No aggressive osseous lesion IMPRESSION: 1. Ovoid mass in the RIGHT hepatic lobe with enhancing septations is most suggestive of HEPATIC ABSCESS. A second  smaller lesion in the RIGHT hepatic lobe is also favored abscess. Less favored differential would include a biliary cystadenoma/cystadenocarcinoma or metastasis. 2. No intrahepatic biliary duct dilatation. Common bile duct minimally dilated to 8 mm. Linear filling defect within the common bile duct is poorly characterized by MRI but favored to represent the reported retained biliary stent. 3. Fullness within the pancreatic head favored to represent normal variation. These results will be called to the ordering clinician or representative by the Radiologist Assistant, and communication documented in the PACS or zVision Dashboard. Electronically Signed   By: Genevive Bi M.D.   On: 11/12/2016 08:22    Labs:  CBC:  Recent  Labs  09/14/16 1149 11/09/16 1316 11/11/16 0920 11/13/16 0729  WBC 6.2 9.8 9.9 11.2*  HGB 13.3 12.2* 12.7* 11.9*  HCT 40.2 36.7* 37.1* 35.9*  PLT 137* 130* 188.0 205    COAGS:  Recent Labs  11/11/16 0920 11/13/16 0729  INR 1.1* 0.88  APTT  --  33    BMP:  Recent Labs  03/16/16 1059 09/14/16 1149 09/15/16 1210 11/09/16 1316 11/11/16 0920  NA 139 140  --  139 138  K 5.2* 5.4* 4.4 4.4 4.2  CL 103 103  --  101 101  CO2 31 23  --  28 31  GLUCOSE 140* 191*  --  216* 253*  BUN 18 19  --  19 9  CALCIUM 10.1 9.3  --  8.1* 9.0  CREATININE 1.17 1.05  --  1.30 1.09    LIVER FUNCTION TESTS:  Recent Labs  02/14/16 1153 09/14/16 1149 11/09/16 1316 11/11/16 0920  BILITOT 1.1 0.5 0.9 1.0  AST 16 16 29 21   ALT 14 18 80* 50  ALKPHOS 60 78 254* 197*  PROT 7.3 6.9 5.7* 6.3  ALBUMIN 4.3 4.3 3.2* 3.6    TUMOR MARKERS: No results for input(s): AFPTM, CEA, CA199, CHROMGRNA in the last 8760 hours.  Assessment and Plan: Patient with history of cholecystitis and pancreatitis s/p cholecystectomy and stent placement presented to the hospital last week with sepsis likely from biliary source. Patient started on antibiotics.  MR Abdomen performed 6/20 shows hepatic abscess.  IR consulted for aspiration and drainage of liver abscess.  Patient presents today with elevated WBC related to infection.  He is on antibiotics.  He has been NPO and does not take blood thinners.  Risks and benefits discussed with the patient including bleeding, infection, damage to adjacent structures, bowel perforation/fistula connection, and sepsis. All of the patient's questions were answered, patient is agreeable to proceed. Consent signed and in chart.  Thank you for this interesting consult.  I greatly enjoyed meeting Nicolas Chandler and look forward to participating in their care.  A copy of this report was sent to the requesting provider on this date.  Electronically Signed: Hoyt Koch,  PA 11/13/2016, 8:02 AM   I spent a total of  30 Minutes   in face to face in clinical consultation, greater than 50% of which was counseling/coordinating care for hepatic abscess

## 2016-11-16 ENCOUNTER — Other Ambulatory Visit: Payer: Self-pay | Admitting: Internal Medicine

## 2016-11-16 ENCOUNTER — Ambulatory Visit: Payer: 59 | Admitting: Internal Medicine

## 2016-11-16 DIAGNOSIS — K750 Abscess of liver: Secondary | ICD-10-CM

## 2016-11-17 ENCOUNTER — Encounter (HOSPITAL_COMMUNITY): Payer: Self-pay | Admitting: *Deleted

## 2016-11-17 ENCOUNTER — Ambulatory Visit: Payer: 59 | Admitting: Internal Medicine

## 2016-11-18 ENCOUNTER — Other Ambulatory Visit: Payer: Self-pay

## 2016-11-18 ENCOUNTER — Telehealth: Payer: Self-pay

## 2016-11-18 ENCOUNTER — Ambulatory Visit (HOSPITAL_COMMUNITY): Payer: 59

## 2016-11-18 ENCOUNTER — Ambulatory Visit (HOSPITAL_COMMUNITY): Payer: 59 | Admitting: Anesthesiology

## 2016-11-18 ENCOUNTER — Encounter (HOSPITAL_COMMUNITY)
Admission: RE | Disposition: A | Discharge status: Home / Self Care | Payer: Self-pay | Source: Ambulatory Visit | Attending: Internal Medicine

## 2016-11-18 ENCOUNTER — Encounter (HOSPITAL_COMMUNITY): Payer: Self-pay | Admitting: *Deleted

## 2016-11-18 ENCOUNTER — Ambulatory Visit (HOSPITAL_COMMUNITY)
Admission: RE | Admit: 2016-11-18 | Discharge: 2016-11-18 | Disposition: A | Discharge status: Home / Self Care | Payer: 59 | Source: Ambulatory Visit | Attending: Internal Medicine | Admitting: Internal Medicine

## 2016-11-18 DIAGNOSIS — K750 Abscess of liver: Secondary | ICD-10-CM | POA: Diagnosis not present

## 2016-11-18 DIAGNOSIS — E119 Type 2 diabetes mellitus without complications: Secondary | ICD-10-CM | POA: Insufficient documentation

## 2016-11-18 DIAGNOSIS — J90 Pleural effusion, not elsewhere classified: Secondary | ICD-10-CM | POA: Insufficient documentation

## 2016-11-18 DIAGNOSIS — Z4689 Encounter for fitting and adjustment of other specified devices: Secondary | ICD-10-CM

## 2016-11-18 DIAGNOSIS — Z79899 Other long term (current) drug therapy: Secondary | ICD-10-CM | POA: Diagnosis not present

## 2016-11-18 DIAGNOSIS — Z7984 Long term (current) use of oral hypoglycemic drugs: Secondary | ICD-10-CM | POA: Insufficient documentation

## 2016-11-18 DIAGNOSIS — K8050 Calculus of bile duct without cholangitis or cholecystitis without obstruction: Secondary | ICD-10-CM | POA: Diagnosis not present

## 2016-11-18 DIAGNOSIS — Z4659 Encounter for fitting and adjustment of other gastrointestinal appliance and device: Secondary | ICD-10-CM | POA: Insufficient documentation

## 2016-11-18 DIAGNOSIS — K8051 Calculus of bile duct without cholangitis or cholecystitis with obstruction: Secondary | ICD-10-CM | POA: Insufficient documentation

## 2016-11-18 DIAGNOSIS — I10 Essential (primary) hypertension: Secondary | ICD-10-CM | POA: Insufficient documentation

## 2016-11-18 HISTORY — DX: Personal history of urinary calculi: Z87.442

## 2016-11-18 HISTORY — DX: Pneumonia, unspecified organism: J18.9

## 2016-11-18 HISTORY — PX: ERCP: SHX5425

## 2016-11-18 LAB — GLUCOSE, CAPILLARY: GLUCOSE-CAPILLARY: 199 mg/dL — AB (ref 65–99)

## 2016-11-18 LAB — AEROBIC/ANAEROBIC CULTURE W GRAM STAIN (SURGICAL/DEEP WOUND): Culture: NO GROWTH

## 2016-11-18 LAB — AEROBIC/ANAEROBIC CULTURE (SURGICAL/DEEP WOUND)

## 2016-11-18 SURGERY — ERCP, WITH INTERVENTION IF INDICATED
Anesthesia: General

## 2016-11-18 MED ORDER — FENTANYL CITRATE (PF) 100 MCG/2ML IJ SOLN
INTRAMUSCULAR | Status: AC
  Filled 2016-11-18: qty 2

## 2016-11-18 MED ORDER — LACTATED RINGERS IV SOLN
INTRAVENOUS | Status: DC
  Administered 2016-11-18 (×2): via INTRAVENOUS

## 2016-11-18 MED ORDER — CIPROFLOXACIN IN D5W 400 MG/200ML IV SOLN: 400.0000 mg | Freq: Once | INTRAVENOUS | Status: AC

## 2016-11-18 MED ORDER — PHENYLEPHRINE 40 MCG/ML (10ML) SYRINGE FOR IV PUSH (FOR BLOOD PRESSURE SUPPORT)
PREFILLED_SYRINGE | INTRAVENOUS | Status: AC
  Filled 2016-11-18: qty 10

## 2016-11-18 MED ORDER — CIPROFLOXACIN IN D5W 400 MG/200ML IV SOLN
INTRAVENOUS | Status: AC
  Filled 2016-11-18: qty 200

## 2016-11-18 MED ORDER — INDOMETHACIN 50 MG RE SUPP
RECTAL | Status: AC
  Filled 2016-11-18: qty 2

## 2016-11-18 MED ORDER — SODIUM CHLORIDE 0.9 % IV SOLN
INTRAVENOUS | Status: DC | PRN
  Administered 2016-11-18: 40 mL

## 2016-11-18 MED ORDER — ONDANSETRON HCL 4 MG/2ML IJ SOLN
INTRAMUSCULAR | Status: DC | PRN
  Administered 2016-11-18: 4 mg via INTRAVENOUS

## 2016-11-18 MED ORDER — CIPROFLOXACIN HCL 750 MG PO TABS: 750.0000 mg | ORAL_TABLET | Freq: Two times a day (BID) | ORAL | 0 refills | Status: DC

## 2016-11-18 MED ORDER — FENTANYL CITRATE (PF) 100 MCG/2ML IJ SOLN
INTRAMUSCULAR | Status: DC | PRN
  Administered 2016-11-18 (×2): 50 ug via INTRAVENOUS

## 2016-11-18 MED ORDER — INDOMETHACIN 50 MG RE SUPP: 100.0000 mg | Freq: Once | RECTAL | Status: AC

## 2016-11-18 MED ORDER — SUCCINYLCHOLINE CHLORIDE 200 MG/10ML IV SOSY
PREFILLED_SYRINGE | INTRAVENOUS | Status: DC | PRN
  Administered 2016-11-18: 120 mg via INTRAVENOUS

## 2016-11-18 MED ORDER — PHENYLEPHRINE 40 MCG/ML (10ML) SYRINGE FOR IV PUSH (FOR BLOOD PRESSURE SUPPORT)
PREFILLED_SYRINGE | INTRAVENOUS | Status: DC | PRN
  Administered 2016-11-18: 40 ug via INTRAVENOUS
  Administered 2016-11-18 (×2): 80 ug via INTRAVENOUS

## 2016-11-18 MED ORDER — PROPOFOL 10 MG/ML IV BOLUS
INTRAVENOUS | Status: DC | PRN
Start: 2016-11-18 — End: 2016-11-18
  Administered 2016-11-18: 160 mg via INTRAVENOUS

## 2016-11-18 MED ORDER — GLUCAGON HCL RDNA (DIAGNOSTIC) 1 MG IJ SOLR
INTRAMUSCULAR | Status: AC
  Filled 2016-11-18: qty 1

## 2016-11-18 MED ORDER — METRONIDAZOLE 500 MG PO TABS: 500.0000 mg | ORAL_TABLET | Freq: Three times a day (TID) | ORAL | 0 refills | Status: DC

## 2016-11-18 MED ORDER — LIDOCAINE 2% (20 MG/ML) 5 ML SYRINGE
INTRAMUSCULAR | Status: DC | PRN
  Administered 2016-11-18: 100 mg via INTRAVENOUS

## 2016-11-18 MED ORDER — PROPOFOL 10 MG/ML IV BOLUS
INTRAVENOUS | Status: AC
  Filled 2016-11-18: qty 20

## 2016-11-18 MED ORDER — INDOMETHACIN 50 MG RE SUPP
RECTAL | Status: DC | PRN
  Administered 2016-11-18: 100 mg via RECTAL

## 2016-11-18 MED ORDER — ONDANSETRON HCL 4 MG/2ML IJ SOLN
INTRAMUSCULAR | Status: AC
  Filled 2016-11-18: qty 2

## 2016-11-18 MED ORDER — SODIUM CHLORIDE 0.9 % IV SOLN: INTRAVENOUS | Status: DC

## 2016-11-18 MED ORDER — CIPROFLOXACIN IN D5W 400 MG/200ML IV SOLN
INTRAVENOUS | Status: DC | PRN
  Administered 2016-11-18: 400 mg via INTRAVENOUS

## 2016-11-18 NOTE — Anesthesia Procedure Notes (Signed)
Procedure Name: Intubation Date/Time: 11/18/2016 9:16 AM Performed by: Lind Covert Pre-anesthesia Checklist: Patient identified, Emergency Drugs available, Suction available, Patient being monitored and Timeout performed Patient Re-evaluated:Patient Re-evaluated prior to inductionOxygen Delivery Method: Circle system utilized Preoxygenation: Pre-oxygenation with 100% oxygen Intubation Type: IV induction Laryngoscope Size: Mac and 4 Grade View: Grade I Tube type: Oral Tube size: 7.5 mm Number of attempts: 1 Airway Equipment and Method: Stylet Placement Confirmation: ETT inserted through vocal cords under direct vision,  positive ETCO2 and breath sounds checked- equal and bilateral Secured at: 22 cm Tube secured with: Tape Dental Injury: Teeth and Oropharynx as per pre-operative assessment

## 2016-11-18 NOTE — Anesthesia Procedure Notes (Signed)
Procedures

## 2016-11-18 NOTE — Anesthesia Postprocedure Evaluation (Signed)
Anesthesia Post Note  Patient: Nicolas Chandler  Procedure(s) Performed: Procedure(s) (LRB): ENDOSCOPIC RETROGRADE CHOLANGIOPANCREATOGRAPHY (ERCP) (N/A)     Patient location during evaluation: PACU Anesthesia Type: General Level of consciousness: awake and alert Pain management: pain level controlled Vital Signs Assessment: post-procedure vital signs reviewed and stable Respiratory status: spontaneous breathing, nonlabored ventilation, respiratory function stable and patient connected to nasal cannula oxygen Cardiovascular status: blood pressure returned to baseline and stable Postop Assessment: no signs of nausea or vomiting Anesthetic complications: no    Last Vitals:  Vitals:   11/18/16 1100 11/18/16 1110  BP: (!) 155/97 (!) 150/93  Pulse: 77 76  Resp: 17 17  Temp:      Last Pain:  Vitals:   11/18/16 1021  TempSrc: Oral                 Shelton SilvasKevin D Kenai Fluegel

## 2016-11-18 NOTE — Anesthesia Preprocedure Evaluation (Addendum)
Anesthesia Evaluation  Patient identified by MRN, date of birth, ID band Patient awake    Reviewed: Allergy & Precautions, NPO status , Patient's Chart, lab work & pertinent test results  Airway Mallampati: I  TM Distance: >3 FB Neck ROM: Full    Dental  (+) Teeth Intact, Dental Advisory Given   Pulmonary neg pulmonary ROS,    breath sounds clear to auscultation       Cardiovascular hypertension, Pt. on medications  Rhythm:Regular Rate:Normal     Neuro/Psych negative neurological ROS  negative psych ROS   GI/Hepatic negative GI ROS, Neg liver ROS,   Endo/Other  diabetes, Type 2, Oral Hypoglycemic Agents  Renal/GU   negative genitourinary   Musculoskeletal negative musculoskeletal ROS (+)   Abdominal   Peds negative pediatric ROS (+)  Hematology negative hematology ROS (+)   Anesthesia Other Findings   Reproductive/Obstetrics negative OB ROS                            Anesthesia Physical Anesthesia Plan  ASA: II  Anesthesia Plan: General   Post-op Pain Management:    Induction: Intravenous  PONV Risk Score and Plan: 2 and Ondansetron and Dexamethasone  Airway Management Planned: Oral ETT  Additional Equipment:   Intra-op Plan:   Post-operative Plan: Extubation in OR  Informed Consent: I have reviewed the patients History and Physical, chart, labs and discussed the procedure including the risks, benefits and alternatives for the proposed anesthesia with the patient or authorized representative who has indicated his/her understanding and acceptance.   Dental advisory given  Plan Discussed with: CRNA  Anesthesia Plan Comments:         Anesthesia Quick Evaluation

## 2016-11-18 NOTE — Op Note (Signed)
[facility] Patient Name: Nicolas Chandler Procedure Date: 11/18/2016 MRN: [id number] Attending MD: Wilhemina Bonito. Marina Goodell , MD Date of Birth: 12-Jun-1965 CSN: [id number] Age: 51 Admit Type: Outpatient Procedure:                ERCP, with stent removal, biliary sphincterotomy,                            and CBD stone removal Indications:              Stent removal. Patient with biliary pancreatitis                            December 2012. Prior ERCP with stent placement                            elsewhere. Status post laparoscopic cholecystectomy                            with IOC. Recent presentation with hepatic abscess.                            Has had percutaneous drainage. Referred for removal                            of index stent. He has been on oral ciprofloxacin                            and metronidazole as an outpatient Providers:                Wilhemina Bonito. Marina Goodell, MD, Janae Sauce. Steele Berg, RN, Arlee Muslim Tech., Technician, Albertina Senegal. Alday CRNA,                            CRNA Referring MD:             Carie Caddy. Pyrtle, MD Medicines:                General Anesthesia Complications:            No immediate complications. Estimated Blood Loss:     Estimated blood loss: none. Procedure:                Pre-Anesthesia Assessment:                           - Prior to the procedure, a History and Physical                            was performed, and patient medications and                            allergies were reviewed. The patient's tolerance of  previous anesthesia was also reviewed. The risks                            and benefits of the procedure and the sedation                            options and risks were discussed with the patient.                            All questions were answered, and informed consent                            was obtained. Prior Anticoagulants: The patient has    taken no previous anticoagulant or antiplatelet                            agents. ASA Grade Assessment: II - A patient with                            mild systemic disease. After reviewing the risks                            and benefits, the patient was deemed in                            satisfactory condition to undergo the procedure. IV                            ciprofloxacin was given.                           After obtaining informed consent, the scope was                            passed under direct vision. Throughout the                            procedure, the patient's blood pressure, pulse, and                            oxygen saturations were monitored continuously. The                            [id number] ([id number]) scope was introduced through                            the mouth, and used to inject contrast into and                            used to inject contrast into the bile duct. The                            ERCP was accomplished without difficulty. The  patient tolerated the procedure well. Scope In: Scope Out: Findings:      ENDOSCOPIC FINDINGS: The esophagus was not visualized with side-viewing       endoscope. Stomach was unremarkable. Duodenum was normal except for the       presence of the previously placed biliary stent observed during from the       ampulla into the duodenum. A biliary stent was visible on the scout       film. As well previous cholecystectomy clips. The upper GI tract was       traversed under direct vision without detailed examination. The minor       papilla was not sought.      X-ray FINDINGS /AND THERAPY: The previously placed biliary stent was       removed with an endoscopic snare. Significant debris extruded from the       ampulla. The bowel duct was then cannulated selectively. Injection       revealed a mildly dilated extrahepatic system with debris. Biliary       sphincterotomy was made with a  traction (standard) sphincterotome using       ERBE electrocautery. Cutting was in the 12:00 orientation through a long       intraduodenal ampulla. There was no post-sphincterotomy bleeding. The       biliary tree was swept with a 12 mm balloon starting at the bifurcation.       A few yellow?"black stones were removed. No stones remained after several       bile duct sweeps. Drainage was excellent. There was no injection or       manipulation of the pancreatic duct, by intent. Impression:               1. Previous biliary stent removed                           2. Status post ERC with sphincterotomy and common                            duct stone extraction the balloon                           3. Status post remote cholecystectomy Moderate Sedation:      none Recommendation:           1. Standard post ERCP care including administration                            of indomethacin rectal suppository and intravenous                            lactated Ringer's                           2. Ongoing management of percutaneous drain per                            interventional radiology                           3. Patient will continue oral antibiotics. Dr.  Pyrtle's nurse will call when medications                           4. Dr. Rhea Belton will see the patient in follow-up.                            His office will arrange                           5. Patient has a contact number available for                            emergencies. The signs and symptoms of potential                            delayed complications were discussed with the                            patient. Return to normal activities tomorrow.                            Written discharge instructions were provided to the                            patient.                           - Resume regular diet. Procedure Code(s):        --- Professional ---                           [phone], Endoscopic  retrograde                            cholangiopancreatography (ERCP); with removal of                            foreign body(s) or stent(s) from biliary/pancreatic                            duct(s)                           43264, Endoscopic retrograde                            cholangiopancreatography (ERCP); with removal of                            calculi/debris from biliary/pancreatic duct(s)                           43262, Endoscopic retrograde                            cholangiopancreatography (ERCP); with  sphincterotomy/papillotomy Diagnosis Code(s):        --- Professional ---                           K80.50, Calculus of bile duct without cholangitis                            or cholecystitis without obstruction                           Z46.59, Encounter for fitting and adjustment of                            other gastrointestinal appliance and device CPT copyright 2016 American Medical Association. All rights reserved. The codes documented in this report are preliminary and upon coder review may  be revised to meet current compliance requirements. Wilhemina Bonito. Marina Goodell, MD 11/18/2016 10:40:34 AM This report has been signed electronically. Number of Addenda: 0

## 2016-11-18 NOTE — Telephone Encounter (Signed)
Spoke with pt and he is aware and knows to come for labs in 1 week, to continue his antibiotics, and is aware of follow-up appt 12/07/16@11 :15am.

## 2016-11-18 NOTE — Telephone Encounter (Signed)
Per Dr. Rhea BeltonPyrtle pt is scheduled to see Dr. Rhea BeltonPyrtle 12/07/16@11 :15am. Cipro and Flagyl sent to pharmacy for pt to continue for another week. Pt needs CBC and CMP in 1 week, order in epic.

## 2016-11-18 NOTE — H&P (View-Only) (Signed)
Patient ID: Nicolas Chandler, male   DOB: 02/13/1966, 51 y.o.   MRN: [id number] HPI: Nicolas Chandler is a 51 year old male with a past medical history of diabetes with recent hospitalization in [place] with gram-negative sepsis felt to be secondary to a biliary source to is seen in urgent consultation by Dr. Lenord FellersBaxley. He is here with his wife today.  He reports that recently while traveling on vacation in [place] he developed fevers and collapsed. He was admitted to the hospital in [place] on 11/04/2016 until 11/08/2016. He was diagnosed with Klebsiella pneumonia bacteremia and treated aggressively with IV hydration and IV antibiotics. This was felt to be secondary to a biliary source. He had chest imaging which suggested a retained biliary stent as well as a 4 cm liver lesion which was incompletely characterized. He also had elevated liver enzymes. He was discharged on Cipro and metronidazole 500 mg twice a day for both medications. He has 8 days of these medications left.  His hospitalization was also, gated by DKA felt secondary to his infection.  He reports that for 2-3 weeks prior to collapsing in [place] he had muscle aches, frequent chills and the feeling of being cold as well as being very easily fatigued. He has continued to feel fatigued but he denies abdominal pain. He's had some rare all discomfort in the right upper quadrant but no true pain. Bowel movements have been regular though at times very slightly looser than normal. He denies blood in his stool or melena. He has had hiccups since last Thursday which she also reports happened in 2014 when he had biliary obstruction secondary to gallstones.  Review of records reveals in December 2014 he was admitted with biliary obstruction and pancreatitis. He had an ERCP performed by Dr. Jeani HawkingPatrick Hung where a plastic biliary stent was placed. Then on 05/17/2013 he had a laparoscopic cholecystectomy. He denies any further procedures and feels that the stent was  never removed. He his wife report being unaware that the stent needed to be removed.  Past Medical History:  Diagnosis Date  . Bacteremia due to Klebsiella pneumoniae   . Cholecystitis 04/2013  . Diabetes mellitus without complication (HCC)   . Elevated LFTs   . Goiter    [facility]. [facility] workup including biopsy of thyroid which was normal; brother has Graves and mother has thyroid disease  . Pancreatitis   . Pleural effusion   . Septic shock [facility](HCC)     Past Surgical History:  Procedure Laterality Date  . CHOLECYSTECTOMY N/A 05/17/2013   Procedure: LAPAROSCOPIC CHOLECYSTECTOMY WITH INTRAOPERATIVE CHOLANGIOGRAM;  Surgeon: Cherylynn RidgesJames O Wyatt, MD;  Location: [facility] OR;  Service: General;  Laterality: N/A;  . ERCP N/A 05/10/2013   Procedure: ENDOSCOPIC RETROGRADE CHOLANGIOPANCREATOGRAPHY (ERCP);  Surgeon: Theda BelfastPatrick D Hung, MD;  [facility];  Service: Endoscopy;  Laterality: N/A;  . LAPAROSCOPIC CHOLECYSTECTOMY  05/17/13   Dr. Lindie SpruceWyatt    Outpatient Medications Prior to Visit  Medication Sig Dispense Refill  . Cholecalciferol (VITAMIN D3) 1000 units CAPS Take by mouth.    . ciprofloxacin (CIPRO) 750 MG tablet Take 750 mg by mouth 2 (two) times daily.    Marland Kitchen. glimepiride (AMARYL) 4 MG tablet TAKE 2 TABLETS BY MOUTH IN THE MORNING 60 tablet 11  . glucose blood (BAYER CONTOUR NEXT TEST) test strip Use as instructed to check blood sugar once daily Dx code E11.9 50 each 2  . lisinopril (PRINIVIL,ZESTRIL) 5 MG tablet TAKE 1 TABLET BY MOUTH IN THE MORNING 30 tablet  5  . metFORMIN (GLUCOPHAGE) 1000 MG tablet Take 1 tablet by mouth two  times daily with meals 180 tablet 0  . Multiple Vitamin (MULTIVITAMIN WITH MINERALS) TABS tablet Take 1 tablet by mouth daily.    . Omega-3 Fatty Acids (FISH OIL) 1000 MG CAPS Take by mouth.    . pioglitazone (ACTOS) 30 MG tablet Take 1 tablet (30 mg total) by mouth daily. 30 tablet 11  . metFORMIN (GLUCOPHAGE) 1000 MG tablet TAKE 1 TABLET BY MOUTH TWICE  A DAY WITH FOOD 180 tablet 3  . metroNIDAZOLE (FLAGYL) 500 MG tablet Take 500 mg by mouth 2 (two) times daily.    Marland Kitchen MICROLET LANCETS MISC Use to check blood sugar once a day dx code E11.9 100 each 1   No facility-administered medications prior to visit.     Allergies  Allergen Reactions  . Albumin (Human) Shortness Of Breath    Bronchospasms, wheezing, fever  . Buscopan [Scopolamine]     hiccups    Family History  Problem Relation Age of Onset  . Thyroid disease Mother        had thyroidectomy  . Thyroid disease Father        Has low thyroid in 2014  . Hypertension Father   . Thyroid disease Brother        Had radioactive iodine treatment  . Diabetes Paternal Uncle   . Colon cancer Neg Hx   . Esophageal cancer Neg Hx   . Stomach cancer Neg Hx     Social History  Substance Use Topics  . Smoking status: Never Smoker  . Smokeless tobacco: Never Used  . Alcohol use No    ROS: As per history of present illness, otherwise negative  BP 100/68   Pulse 84   Ht 5\' 6"  (1.676 m)   Wt 140 lb 12.8 oz (63.9 kg)   BMI 22.73 kg/m  Constitutional: Well-developed and well-nourished. No distress. HEENT: Normocephalic and atraumatic. Oropharynx is clear and moist. Conjunctivae are normal.  No scleral icterus. Neck: Neck supple. Trachea midline. Cardiovascular: Normal rate, regular rhythm and intact distal pulses. No M/R/G Pulmonary/chest: Effort normal and breath sounds normal. No wheezing, rales or rhonchi. Abdominal: Soft, nontender, nondistended. Bowel sounds active throughout. There are no masses palpable. No hepatosplenomegaly. Extremities: no clubbing, cyanosis, or edema Neurological: Alert and oriented to person place and time. Skin: Skin is warm and dry.  Psychiatric: Normal mood and affect. Behavior is normal.  RELEVANT LABS AND IMAGING: CBC    Component Value Date/Time   WBC 9.8 11/09/2016 1316   RBC 4.01 (L) 11/09/2016 1316   HGB 12.2 (L) 11/09/2016 1316   HCT  36.7 (L) 11/09/2016 1316   PLT 130 (L) 11/09/2016 1316   MCV 91.5 11/09/2016 1316   MCH 30.4 11/09/2016 1316   MCHC 33.2 11/09/2016 1316   RDW 13.1 11/09/2016 1316   LYMPHSABS 2,058 11/09/2016 1316   MONOABS 392 11/09/2016 1316   EOSABS 196 11/09/2016 1316   BASOSABS 98 11/09/2016 1316    CMP     Component Value Date/Time   NA 139 11/09/2016 1316   K 4.4 11/09/2016 1316   CL 101 11/09/2016 1316   CO2 28 11/09/2016 1316   GLUCOSE 216 (H) 11/09/2016 1316   BUN 19 11/09/2016 1316   CREATININE 1.30 11/09/2016 1316   CALCIUM 8.1 (L) 11/09/2016 1316   PROT 5.7 (L) 11/09/2016 1316   ALBUMIN 3.2 (L) 11/09/2016 1316   AST 29 11/09/2016 1316   ALT  80 (H) 11/09/2016 1316   ALKPHOS 254 (H) 11/09/2016 1316   BILITOT 0.9 11/09/2016 1316   GFRNONAA 85 09/10/2014 1012   GFRAA >89 09/10/2014 1012   CT angiography chest with and without contrast dated 11/07/2016 Pertinent findings: Negative exam for PE. Bilateral pleural effusions. 4.4 x 3.5 cm low-attenuation mass in segment 7 of liver. Prior cholecystectomy with stent identified within the CBD. Large pancreatic head and liver lesion raising possibility of pancreatic mass.  ASSESSMENT/PLAN: 51 year old male with a past medical history of diabetes with recent hospitalization in [place] with gram-negative sepsis felt to be secondary to a biliary source to is seen in urgent consultation by Dr. Lenord Fellers.  1. Gram-negative rod sepsis/retained biliary stent/liver lesion seen by CT/question pancreatic mass -- I feel that the source of his gram-negative rod bacteremia is likely biliary obstruction from the retained biliary stent. We need to further evaluate this liver lesion and exclude abscess, other possibilities include benign hemangioma and less likely malignancy. The enlarged pancreatic head may also be secondary to inflammation from the retained pancreatic stent. I recommended the following: --MRI abdomen with and without contrast plus  MRCP; scheduled for tonight at 7 PM --After review of imaging will need probable ERCP for stent removal and possibly CBD stone removal/cholangiogram --Continue oral antibiotics at a very minimum until ERCP and perhaps longer (he has 8 days left of Cipro and Flagyl which he will continue) --CBC, CMP and INR today  2. Hiccups -- widely the result of recent infection/biliary obstruction. Chlorpromazine 25 mg 3 times a day given to help control hiccups. He is advised to use a dose at least one hour before MRI as motion from hiccuping can/may compromise image quality       ZO:XWRUEA, Luanna Cole, Md [address] [place], [place] [address]

## 2016-11-18 NOTE — Discharge Instructions (Signed)
YOU HAD AN ENDOSCOPIC PROCEDURE TODAY: Refer to the procedure report and other information in the discharge instructions given to you for any specific questions about what was found during the examination. If this information does not answer your questions, please call [place] office at [phone] to clarify.   YOU SHOULD EXPECT: Some feelings of bloating in the abdomen. Passage of more gas than usual. Walking can help get rid of the air that was put into your GI tract during the procedure and reduce the bloating. If you had a lower endoscopy (such as a colonoscopy or flexible sigmoidoscopy) you may notice spotting of blood in your stool or on the toilet paper. Some abdominal soreness may be present for a day or two, also.  DIET: Your first meal following the procedure should be a light meal and then it is ok to progress to your normal diet. A half-sandwich or bowl of soup is an example of a good first meal. Heavy or fried foods are harder to digest and may make you feel nauseous or bloated. Drink plenty of fluids but you should avoid alcoholic beverages for 24 hours. If you had a esophageal dilation, please see attached instructions for diet.    ACTIVITY: Your care partner should take you home directly after the procedure. You should plan to take it easy, moving slowly for the rest of the day. You can resume normal activity the day after the procedure however YOU SHOULD NOT DRIVE, use power tools, machinery or perform tasks that involve climbing or major physical exertion for 24 hours (because of the sedation medicines used during the test).   SYMPTOMS TO REPORT IMMEDIATELY: A gastroenterologist can be reached at any hour. Please call [phone]  for any of the following symptoms:   Following upper endoscopy (EGD, EUS, ERCP, esophageal dilation) Vomiting of blood or coffee ground material  New, significant abdominal pain  New, significant chest pain or pain under the shoulder blades  Painful or  persistently difficult swallowing  New shortness of breath  Black, tarry-looking or red, bloody stools  FOLLOW UP:  If any biopsies were taken you will be contacted by phone or by letter within the next 1-3 weeks. Call [phone]  if you have not heard about the biopsies in 3 weeks.  Please also call with any specific questions about appointments or follow up tests.  

## 2016-11-18 NOTE — Transfer of Care (Signed)
Immediate Anesthesia Transfer of Care Note  Patient: Nicolas Chandler  Procedure(s) Performed: Procedure(s): ENDOSCOPIC RETROGRADE CHOLANGIOPANCREATOGRAPHY (ERCP) (N/A)  Patient Location: PACU  Anesthesia Type:General  Level of Consciousness: sedated  Airway & Oxygen Therapy: Patient Spontanous Breathing and Patient connected to face mask oxygen  Post-op Assessment: Report given to RN and Post -op Vital signs reviewed and stable  Post vital signs: Reviewed and stable  Last Vitals:  Vitals:   11/18/16 0750  BP: 97/60  Pulse: 88  Resp: 14  Temp: 36.8 C    Last Pain:  Vitals:   11/18/16 0750  TempSrc: Oral         Complications: No apparent anesthesia complications

## 2016-11-18 NOTE — Interval H&P Note (Signed)
History and Physical Interval Note:  11/18/2016 9:10 AM  Fawaz Jaynie CollinsLim  has presented today for surgery, with the diagnosis of Removal of Biliary Stent  The various methods of treatment have been discussed with the patient and family. After consideration of risks, benefits and other options for treatment, the patient has consented to  Procedure(s): ENDOSCOPIC RETROGRADE CHOLANGIOPANCREATOGRAPHY (ERCP) (N/A) as a surgical intervention .  The patient's history has been reviewed, patient examined, no change in status, stable for surgery.  I have reviewed the patient's chart and labs.  Questions were answered to the patient's satisfaction.    History, laboratories, x-rays reviewed including previous ERCP and post laparoscopic cholecystectomy IOC. Case discussed with Dr. Sharla KidneyPirtle. Patient had biliary pancreatitis December 2014. ERCP with stent placement. Sludge reported but no definite stone. IOC with stent placement no stone. Stent not removed. Patient presents recently with hepatic abscess for which she underwent drainage last week. Her today with family. No complaints. Reports decreased drainage for which she has touch base with interventional radiology. He finishes his antibiotics today (Metronidazole and ciprofloxacin). Now for ERCP and stent removal with Probable sphincterotomy. I personally reviewed the nature of the procedure, as well as the risks, benefits, and alternatives were carefully and thoroughly reviewed with the patient. Ample time for discussion and questions allowed. The patient understood, was satisfied, and agreed to proceed.  Yancey FlemingsJohn Kimika Streater

## 2016-11-20 ENCOUNTER — Encounter (HOSPITAL_COMMUNITY): Payer: Self-pay | Admitting: Internal Medicine

## 2016-11-23 ENCOUNTER — Ambulatory Visit
Admission: RE | Admit: 2016-11-23 | Discharge: 2016-11-23 | Disposition: A | Discharge status: Home / Self Care | Payer: 59 | Source: Ambulatory Visit | Attending: Internal Medicine | Admitting: Internal Medicine

## 2016-11-23 ENCOUNTER — Encounter: Payer: Self-pay | Admitting: Internal Medicine

## 2016-11-23 ENCOUNTER — Ambulatory Visit (INDEPENDENT_AMBULATORY_CARE_PROVIDER_SITE_OTHER): Payer: 59 | Admitting: Internal Medicine

## 2016-11-23 VITALS — BP 82/60 | HR 121 | Temp 98.5°F | Resp 26 | Wt 132.0 lb

## 2016-11-23 DIAGNOSIS — E119 Type 2 diabetes mellitus without complications: Secondary | ICD-10-CM | POA: Diagnosis not present

## 2016-11-23 DIAGNOSIS — D696 Thrombocytopenia, unspecified: Secondary | ICD-10-CM | POA: Diagnosis not present

## 2016-11-23 DIAGNOSIS — J90 Pleural effusion, not elsewhere classified: Secondary | ICD-10-CM

## 2016-11-23 LAB — CBC WITH DIFFERENTIAL/PLATELET
BASOS ABS: 0 {cells}/uL (ref 0–200)
BASOS PCT: 0 %
Eosinophils Absolute: 210 cells/uL (ref 15–500)
Eosinophils Relative: 2 %
HCT: 37.6 % — ABNORMAL LOW (ref 38.5–50.0)
HEMOGLOBIN: 12.4 g/dL — AB (ref 13.2–17.1)
LYMPHS ABS: 1365 {cells}/uL (ref 850–3900)
Lymphocytes Relative: 13 %
MCH: 30.2 pg (ref 27.0–33.0)
MCHC: 33 g/dL (ref 32.0–36.0)
MCV: 91.5 fL (ref 80.0–100.0)
MPV: 9.7 fL (ref 7.5–12.5)
Monocytes Absolute: 525 cells/uL (ref 200–950)
Monocytes Relative: 5 %
NEUTROS ABS: 8400 {cells}/uL — AB (ref 1500–7800)
Neutrophils Relative %: 80 %
PLATELETS: 324 10*3/uL (ref 140–400)
RBC: 4.11 MIL/uL — ABNORMAL LOW (ref 4.20–5.80)
RDW: 13.4 % (ref 11.0–15.0)
WBC: 10.5 10*3/uL (ref 3.8–10.8)

## 2016-11-23 LAB — COMPLETE METABOLIC PANEL WITH GFR
ALT: 12 U/L (ref 9–46)
AST: 15 U/L (ref 10–40)
Albumin: 3.8 g/dL (ref 3.6–5.1)
Alkaline Phosphatase: 124 U/L — ABNORMAL HIGH (ref 40–115)
BILIRUBIN TOTAL: 0.8 mg/dL (ref 0.2–1.2)
BUN: 17 mg/dL (ref 7–25)
CO2: 25 mmol/L (ref 20–31)
CREATININE: 1.32 mg/dL (ref 0.60–1.35)
Calcium: 9 mg/dL (ref 8.6–10.3)
Chloride: 100 mmol/L (ref 98–110)
GFR, EST AFRICAN AMERICAN: 73 mL/min (ref >=60)
GFR, Est Non African American: 63 mL/min (ref >=60)
GLUCOSE: 285 mg/dL — AB (ref 65–99)
Potassium: 4.4 mmol/L (ref 3.5–5.3)
Sodium: 135 mmol/L (ref 135–146)
TOTAL PROTEIN: 6.6 g/dL (ref 6.1–8.1)

## 2016-11-23 NOTE — Progress Notes (Signed)
   Subjective:    Patient ID: Nicolas Chandler, male    DOB: 08/12/1965, 51 y.o.   MRN: [id number]  HPI 51-year-old Male recently hospitalized in [place] with Klebsiella pneumoniae  septicemia. Was found to have a retained biliary stent. He was in DKA. He had bilateral pleural effusions. GI source was speculated. He had thrombocytopenia secondary to sepsis. He's here to follow-up on thrombocytopenia and pleural effusions.  He is status post severe gallstone pancreatitis December 2014  and underwent laparoscopic cholecystectomy by Dr. Wyatt.  Diabetes is controlled with oral agents. Says Accu-Cheks have been about 180. He is only checking it once a day at bedrest. Says is not eating very much. Little appetite. CBC and C met drawn today along with hemoglobin A1c  Repeat chest x-ray today shows no right-sided pleural effusion. Small caliber drain placement Dr. Schick and place  Recently had biliary stent removed, ERCP, common bile duct stone removal by Dr. John Perry on June 27.  CBC drawn today to follow-up on thrombocytopenia. Alkaline phosphatase on June 20 had decreased from 254-197. CBC on June 20 showed improvement in platelet count from 130,000 188,000. Hemoglobin was 12.7 g on June 20.  On June 22, Dr. Schick placed drain for posterior right hepatic done abscess. This is still in place and he is to follow-up later this week.  Still feels tired and weak. Sometimes says he is tachycardic with minimal activity. Is mostly been staying in bed. Encouraged him to sit in a chair for couple of hours twice daily to get his strength back.    Review of Systems     Objective:   Physical Exam Weight today is 132 pounds. His blood pressure is low at 82/16  andis tachycardic at 121 on arrival. Respiratory rate 26. He looks better than he did last week.Labs drawn.       Assessment & Plan:  ? Volume depletion  Plan: I think he's not been eating and drinking as well as he should be. We have drawn lab  work and will advise later. He may need sliding scale insulin or Lantus insulin to help control hyperglycemia.  Addendum: I am not pleased with his lab work this evening and called patient and his wife to have them return tomorrow for further evaluation 

## 2016-11-24 ENCOUNTER — Other Ambulatory Visit: Payer: Self-pay

## 2016-11-24 ENCOUNTER — Encounter: Payer: Self-pay | Admitting: Internal Medicine

## 2016-11-24 ENCOUNTER — Telehealth: Payer: Self-pay

## 2016-11-24 ENCOUNTER — Ambulatory Visit (INDEPENDENT_AMBULATORY_CARE_PROVIDER_SITE_OTHER): Payer: 59 | Admitting: Internal Medicine

## 2016-11-24 ENCOUNTER — Encounter (HOSPITAL_COMMUNITY): Payer: Self-pay

## 2016-11-24 ENCOUNTER — Telehealth: Payer: Self-pay | Admitting: Gastroenterology

## 2016-11-24 ENCOUNTER — Emergency Department (HOSPITAL_COMMUNITY)
Admission: EM | Admit: 2016-11-24 | Discharge: 2016-11-24 | Disposition: A | Discharge status: Home / Self Care | Payer: 59 | Attending: Emergency Medicine | Admitting: Emergency Medicine

## 2016-11-24 VITALS — BP 86/60 | HR 112 | Temp 97.8°F | Resp 28 | Wt 132.0 lb

## 2016-11-24 DIAGNOSIS — E860 Dehydration: Secondary | ICD-10-CM | POA: Diagnosis not present

## 2016-11-24 DIAGNOSIS — Z79899 Other long term (current) drug therapy: Secondary | ICD-10-CM | POA: Diagnosis not present

## 2016-11-24 DIAGNOSIS — I951 Orthostatic hypotension: Secondary | ICD-10-CM | POA: Diagnosis not present

## 2016-11-24 DIAGNOSIS — E119 Type 2 diabetes mellitus without complications: Secondary | ICD-10-CM | POA: Insufficient documentation

## 2016-11-24 DIAGNOSIS — E869 Volume depletion, unspecified: Secondary | ICD-10-CM | POA: Diagnosis not present

## 2016-11-24 DIAGNOSIS — E785 Hyperlipidemia, unspecified: Secondary | ICD-10-CM | POA: Insufficient documentation

## 2016-11-24 DIAGNOSIS — Z7984 Long term (current) use of oral hypoglycemic drugs: Secondary | ICD-10-CM | POA: Diagnosis not present

## 2016-11-24 DIAGNOSIS — I959 Hypotension, unspecified: Secondary | ICD-10-CM | POA: Diagnosis present

## 2016-11-24 LAB — URINALYSIS, ROUTINE W REFLEX MICROSCOPIC
Bilirubin Urine: NEGATIVE
Glucose, UA: NEGATIVE mg/dL
Hgb urine dipstick: NEGATIVE
Ketones, ur: NEGATIVE mg/dL
NITRITE: NEGATIVE
PROTEIN: NEGATIVE mg/dL
SPECIFIC GRAVITY, URINE: 1.014 (ref 1.005–1.030)
SQUAMOUS EPITHELIAL / LPF: NONE SEEN
pH: 5 (ref 5.0–8.0)

## 2016-11-24 LAB — COMPREHENSIVE METABOLIC PANEL
ALBUMIN: 3.6 g/dL (ref 3.5–5.0)
ALK PHOS: 124 U/L (ref 38–126)
ALT: 16 U/L — AB (ref 17–63)
ANION GAP: 11 (ref 5–15)
AST: 21 U/L (ref 15–41)
BILIRUBIN TOTAL: 1.1 mg/dL (ref 0.3–1.2)
BUN: 24 mg/dL — AB (ref 6–20)
CALCIUM: 9.6 mg/dL (ref 8.9–10.3)
CO2: 24 mmol/L (ref 22–32)
CREATININE: 1.98 mg/dL — AB (ref 0.61–1.24)
Chloride: 100 mmol/L — ABNORMAL LOW (ref 101–111)
GFR calc Af Amer: 44 mL/min — ABNORMAL LOW (ref >60)
GFR calc non Af Amer: 38 mL/min — ABNORMAL LOW (ref >60)
GLUCOSE: 211 mg/dL — AB (ref 65–99)
Potassium: 4.7 mmol/L (ref 3.5–5.1)
SODIUM: 135 mmol/L (ref 135–145)
TOTAL PROTEIN: 7.4 g/dL (ref 6.5–8.1)

## 2016-11-24 LAB — CBC WITH DIFFERENTIAL/PLATELET
BASOS ABS: 0 10*3/uL (ref 0.0–0.1)
BASOS PCT: 0 %
EOS PCT: 3 %
Eosinophils Absolute: 0.2 10*3/uL (ref 0.0–0.7)
HEMATOCRIT: 37.3 % — AB (ref 39.0–52.0)
Hemoglobin: 12.4 g/dL — ABNORMAL LOW (ref 13.0–17.0)
Lymphocytes Relative: 28 %
Lymphs Abs: 2.6 10*3/uL (ref 0.7–4.0)
MCH: 30.2 pg (ref 26.0–34.0)
MCHC: 33.2 g/dL (ref 30.0–36.0)
MCV: 90.8 fL (ref 78.0–100.0)
MONO ABS: 0.6 10*3/uL (ref 0.1–1.0)
MONOS PCT: 6 %
NEUTROS ABS: 5.9 10*3/uL (ref 1.7–7.7)
Neutrophils Relative %: 63 %
PLATELETS: 316 10*3/uL (ref 150–400)
RBC: 4.11 MIL/uL — ABNORMAL LOW (ref 4.22–5.81)
RDW: 13.5 % (ref 11.5–15.5)
WBC: 9.4 10*3/uL (ref 4.0–10.5)

## 2016-11-24 LAB — BASIC METABOLIC PANEL
Anion gap: 8 (ref 5–15)
BUN: 21 mg/dL — AB (ref 6–20)
CO2: 23 mmol/L (ref 22–32)
Calcium: 8.1 mg/dL — ABNORMAL LOW (ref 8.9–10.3)
Chloride: 105 mmol/L (ref 101–111)
Creatinine, Ser: 1.76 mg/dL — ABNORMAL HIGH (ref 0.61–1.24)
GFR calc Af Amer: 51 mL/min — ABNORMAL LOW (ref >60)
GFR, EST NON AFRICAN AMERICAN: 44 mL/min — AB (ref >60)
GLUCOSE: 162 mg/dL — AB (ref 65–99)
POTASSIUM: 4.3 mmol/L (ref 3.5–5.1)
Sodium: 136 mmol/L (ref 135–145)

## 2016-11-24 LAB — HEMOGLOBIN A1C
HEMOGLOBIN A1C: 10.2 % — AB (ref <5.7)
Mean Plasma Glucose: 246 mg/dL

## 2016-11-24 LAB — I-STAT CG4 LACTIC ACID, ED
LACTIC ACID, VENOUS: 1.83 mmol/L (ref 0.5–1.9)
Lactic Acid, Venous: 1.92 mmol/L (ref 0.5–1.9)

## 2016-11-24 MED ORDER — SODIUM CHLORIDE 0.9 % IV BOLUS (SEPSIS)
1000.0000 mL | Freq: Once | INTRAVENOUS | Status: AC
  Administered 2016-11-24: 1000 mL via INTRAVENOUS

## 2016-11-24 NOTE — ED Triage Notes (Signed)
Pt sent here from PCP for generalized fatigue, dizziness, hypotension, and tachycardia. Pts skin noted to be pale and slightly diaphoretic. Alert and oriented. Hx of sepsis.

## 2016-11-24 NOTE — Consult Note (Addendum)
[facility]   Called by ER physician to evaluate for admission.   Patient chart reviewed, he is a 51 year old with history of hypertension diabetes who presented from the PCP due to hypotension and tachycardia. He was hydrated in the ED and blood pressure has been stable on labs he was found to have AKI that has improved with hydration. Patient with no signs of acute infection, normal white count, no fever and chest x-ray with no acute abnormalities, UA negative, and normal LFTs. Given patient is tolerating by mouth intake very well he will not need admission at this time. Patient was dehydrated from poor oral intake. He can be discharged home and encourage oral hydration. Patient should follow-up with primary care physician within a week.   Latrelle DodrillEdwin Silva, MD

## 2016-11-24 NOTE — Progress Notes (Signed)
   Subjective:    Patient ID: Nicolas Chandler, male    DOB: 03/15/1966, 51 y.o.   MRN: [id number]  HPI Patient is status post ERCP with sphincterotomy and common bile duct stone removal last week by Dr. Marina GoodellPerry. 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 Prior to that [place] had [place] and [place].  I had him return today to follow-up from yesterday's office visit.  I noticed yesterday he was tachycardic with low blood pressure. He said he felt weak and washed out and had been staying in bed for the most part.  We did lab work yesterday. His glucose was 285. He apparently had eaten noodles before coming to the office. He remains on Cipro and Flagyl. I had wanted to follow-up with bilateral pleural effusion so he had a chest x-ray showing resolution of those. He also had a low platelet count from his hospitalization and [facility] [place]. This is resolved. His alkaline phosphatase has improved from 197-124. However his creatinine has gone up from 1.09 and 1.32.  I cannot tell if he's truly orthostatic but I suspect that he is as when he stands up, we cannot hear his blood pressure. I cannot palpated either. His pulse is weak when he stands. He complains of being dizzy when he stands.  I think he is volume depleted at this point. Wife says he vomited once last evening.    Review of Systems he has no complaints of abdominal pain     Objective:   Physical Exam His chest is clear. Cardiac exam tachycardia. Extremities without edema. Accu-Chek is 236 but he ate rice and chicken see but 9 AM.  His pulse ox is 97%. Respiratory rate is 28. He is afebrile. Blood pressure sitting is 86/60 and pulse is 112 sitting. He walked in from the car.       Assessment & Plan:  Status post ERCP with sphincterotomy, and bile duct stone removal. Drain in place for hepatic abscess and is supposedly to be removed in radiology on Thursday according to patient.  White blood cell count is 10,500  Type 2 diabetes mellitus-hemoglobin A1c elevated at 10.2% and previously 2 months ago 7.4% He's been hospitalized with septicemia in [place] recently.. Endocrinologist is Dr. Lucianne MussKumar and he is on oral agents only. Has not wanted to be on insulin.  Elevated serum creatinine suspect volume depletion. Sent to ER for further evaluation. May need to be admitted for IV fluid hydration and management of diabetes.

## 2016-11-24 NOTE — ED Notes (Signed)
Pt ambulated approximately [id number] with out difficulty. Pt has no complaints at this time. Pt returned to stretcher with call bell within reach.

## 2016-11-24 NOTE — Telephone Encounter (Signed)
Received call from Dr. Lenord FellersBaxley that the patient was in her office for the past two days, he was hypotensive and tachycardic and vomited x 1, creatinine was 1.38. Pt was sent to the ER and received 2 liters of fluid and were ready to send him home. Dr. Lenord FellersBaxley thinks pt needs to be admitted. Let Dr. Lenord FellersBaxley know that Dr. Christella HartiganJacobs is the hospital physician this week, cell number given to Dr. Lenord FellersBaxley.

## 2016-11-24 NOTE — ED Provider Notes (Signed)
Face-to-face evaluation   History: Here for evaluation of LBP and hyperglycemia   Physical exam: Alert, nontoxic.  Heart regular rate and rhythm no murmur lungs clear.  Abdomen normal bowel sounds soft and nontender.  Drain site, right upper quadrant, appears normal.  Small amount of slightly discolored fluid and drainage bulb.   BUN  Date Value Ref Range Status  11/24/2016 24 (H) 6 - 20 mg/dL Final  78/29/562107/06/2016 17 7 - 25 mg/dL Final  30/86/578406/20/2018 9 6 - 23 mg/dL Final  69/62/952806/18/2018 19 7 - 25 mg/dL Final   Creat  Date Value Ref Range Status  11/23/2016 1.32 0.60 - 1.35 mg/dL Final  41/32/440106/18/2018 0.271.30 0.60 - 1.35 mg/dL Final  25/36/644004/23/2018 3.471.05 0.60 - 1.35 mg/dL Final  42/59/563804/18/2016 7.561.04 0.50 - 1.35 mg/dL Final   Creatinine, Ser  Date Value Ref Range Status  11/24/2016 1.98 (H) 0.61 - 1.24 mg/dL Final  43/32/951806/20/2018 8.411.09 0.40 - 1.50 mg/dL Final  66/06/301610/23/2017 0.101.17 0.40 - 1.50 mg/dL Final  93/23/557309/22/2017 2.201.22 0.40 - 1.50 mg/dL Final   Glucose, Bld  Date Value Ref Range Status  11/24/2016 162 (H) 65 - 99 mg/dL Final  25/42/706207/07/2016 376211 (H) 65 - 99 mg/dL Final  28/31/517607/06/2016 160285 (H) 65 - 99 mg/dL Final  73/71/062606/20/2018 948253 (H) 70 - 99 mg/dL Final   Results for orders placed or performed during the hospital encounter of 11/24/16  Comprehensive metabolic panel  Result Value Ref Range   Sodium 135 135 - 145 mmol/L   Potassium 4.7 3.5 - 5.1 mmol/L   Chloride 100 (L) 101 - 111 mmol/L   CO2 24 22 - 32 mmol/L   Glucose, Bld 211 (H) 65 - 99 mg/dL   BUN 24 (H) 6 - 20 mg/dL   Creatinine, Ser 5.461.98 (H) 0.61 - 1.24 mg/dL   Calcium 9.6 8.9 - 27.010.3 mg/dL   Total Protein 7.4 6.5 - 8.1 g/dL   Albumin 3.6 3.5 - 5.0 g/dL   AST 21 15 - 41 U/L   ALT 16 (L) 17 - 63 U/L   Alkaline Phosphatase 124 38 - 126 U/L   Total Bilirubin 1.1 0.3 - 1.2 mg/dL   GFR calc non Af Amer 38 (L) >60 mL/min   GFR calc Af Amer 44 (L) >60 mL/min   Anion gap 11 5 - 15  CBC with Differential  Result Value Ref Range   WBC 9.4 4.0 - 10.5 K/uL    RBC 4.11 (L) 4.22 - 5.81 MIL/uL   Hemoglobin 12.4 (L) 13.0 - 17.0 g/dL   HCT 35.037.3 (L) 09.339.0 - 81.852.0 %   MCV 90.8 78.0 - 100.0 fL   MCH 30.2 26.0 - 34.0 pg   MCHC 33.2 30.0 - 36.0 g/dL   RDW 29.913.5 37.111.5 - 69.615.5 %   Platelets 316 150 - 400 K/uL   Neutrophils Relative % 63 %   Neutro Abs 5.9 1.7 - 7.7 K/uL   Lymphocytes Relative 28 %   Lymphs Abs 2.6 0.7 - 4.0 K/uL   Monocytes Relative 6 %   Monocytes Absolute 0.6 0.1 - 1.0 K/uL   Eosinophils Relative 3 %   Eosinophils Absolute 0.2 0.0 - 0.7 K/uL   Basophils Relative 0 %   Basophils Absolute 0.0 0.0 - 0.1 K/uL  Urinalysis, Routine w reflex microscopic  Result Value Ref Range   Color, Urine YELLOW YELLOW   APPearance CLEAR CLEAR   Specific Gravity, Urine 1.014 1.005 - 1.030   pH 5.0 5.0 - 8.0  Glucose, UA NEGATIVE NEGATIVE mg/dL   Hgb urine dipstick NEGATIVE NEGATIVE   Bilirubin Urine NEGATIVE NEGATIVE   Ketones, ur NEGATIVE NEGATIVE mg/dL   Protein, ur NEGATIVE NEGATIVE mg/dL   Nitrite NEGATIVE NEGATIVE   Leukocytes, UA SMALL (A) NEGATIVE   RBC / HPF 0-5 0 - 5 RBC/hpf   WBC, UA 0-5 0 - 5 WBC/hpf   Bacteria, UA RARE (A) NONE SEEN   Squamous Epithelial / LPF NONE SEEN NONE SEEN   Mucous PRESENT    Hyaline Casts, UA PRESENT   Basic metabolic panel  Result Value Ref Range   Sodium 136 135 - 145 mmol/L   Potassium 4.3 3.5 - 5.1 mmol/L   Chloride 105 101 - 111 mmol/L   CO2 23 22 - 32 mmol/L   Glucose, Bld 162 (H) 65 - 99 mg/dL   BUN 21 (H) 6 - 20 mg/dL   Creatinine, Ser 1.61 (H) 0.61 - 1.24 mg/dL   Calcium 8.1 (L) 8.9 - 10.3 mg/dL   GFR calc non Af Amer 44 (L) >60 mL/min   GFR calc Af Amer 51 (L) >60 mL/min   Anion gap 8 5 - 15  I-Stat CG4 Lactic Acid, ED  Result Value Ref Range   Lactic Acid, Venous 1.92 (HH) 0.5 - 1.9 mmol/L  I-Stat CG4 Lactic Acid, ED  Result Value Ref Range   Lactic Acid, Venous 1.83 0.5 - 1.9 mmol/L    Medical decision making-malaise, and weakness, following recent surgical procedure.  There is no  evidence for serious bacterial infection or metabolic instability.  Creatinine higher, and GFR lower, than yesterday.  Patient admits to decreased oral intake, because he is not hungry, or thirsty.  He feels this has worsened, since his recent surgical procedure.  The patient was seen by his PCP yesterday and today, who sent him here because of the concern for rising creatinine, and blood sugar.  While it is not indicated in the PCPs chart, the patient states that she told him, that he may need to be on insulin.  Apparently, his hemoglobin A1c is higher than baseline 2 months ago.  The patient is due to have his JP drain, removed, in 3 days.  It is unlikely that there is an intra-abdominal worsening infection, at this time.  The patient has been treated with high-volume IV fluids, rapidly.  It is expected that this will improve his creatinine, GFR, and high blood sugar.  Yesterday the BUN creatinine ratio was 15, today the BUN/creatinine ratio is 12, this is an improvement.  The patient's urine specific gravity is normal at 1.014, normal.  Initial lactate, listed as high, but it is less than 2.  Blood sugar on initial testing is 211.  15: 35-the case was discussed with his primary care provider, regarding his signs, symptoms, laboratory testing and vital signs.  She is concerned that if he goes home he will be a risk for worsening condition.  At this time the patient's orthostatic vital signs, are reassuring.  He has been able to drink some fluids and ambulate easily.  We will contact hospitalist for their evaluation, regarding a possible admission.  The Primary care doctor would like to have them evaluate the patient, for admission.  Medical screening examination/treatment/procedure(s) were conducted as a shared visit with non-physician practitioner(s) and myself.  I personally evaluated the patient during the encounter   Mancel Bale, MD 11/25/16 [phone]

## 2016-11-24 NOTE — Telephone Encounter (Signed)
I spoke with Dr. Lenord FellersBaxley.  [facility] is [facility] in [facility].  His LFTs are normal and normal WBC, Cr a bit improved after fluid I suspect.  I think IV hydration is reasonable for now.  Will forward to Drs. Marina GoodellPerry and Pyrtle.

## 2016-11-24 NOTE — Discharge Instructions (Signed)
Please read and follow all provided instructions.  Your diagnoses today include:  1. Dehydration   2. Orthostatic hypotension     Tests performed today include: Vital signs. See below for your results today.   Medications prescribed:  Take as prescribed   Home care instructions:  Follow any educational materials contained in this packet.  Follow-up instructions: Please follow-up with your primary care provider for further evaluation of symptoms and treatment   Return instructions:  Please return to the Emergency Department if you do not get better, if you get worse, or new symptoms OR  - Fever (temperature greater than 101.67F)  - Bleeding that does not stop with holding pressure to the area    -Severe pain (please note that you may be more sore the day after your accident)  - Chest Pain  - Difficulty breathing  - Severe nausea or vomiting  - Inability to tolerate food and liquids  - Passing out  - Skin becoming red around your wounds  - Change in mental status (confusion or lethargy)  - New numbness or weakness    Please return if you have any other emergent concerns.  Additional Information:  Your vital signs today were: BP 113/78    Pulse 87    Temp 98.2 F (36.8 C) (Oral)    Resp 15    SpO2 100%  If your blood pressure (BP) was elevated above 135/85 this visit, please have this repeated by your doctor within one month. ---------------

## 2016-11-24 NOTE — Patient Instructions (Signed)
Procedure emergency department for IV fluids and further evaluation

## 2016-11-24 NOTE — Patient Instructions (Signed)
Lab work drawn and pending. Patient to return tomorrow for reevaluation

## 2016-11-24 NOTE — ED Provider Notes (Signed)
[facility]-EMERGENCY DEPT Provider Note   CSN: [id number] Arrival date & time: 11/24/16  1040     History   Chief Complaint Chief Complaint  Patient presents with  . Hypotension    HPI Nicolas Chandler is a 51 y.o. male.  HPI  51 y.o. male with a hx of HTN, DM, presents to the Emergency Department today from PCP office due to hypotension and tachycardia. Per chart review, pt is post ERCP with sphincterotomy and CBD stone removal last week by Dr. Marina Goodell. Drain was placed to aid in hepatic dome abscess drainage. Hospitalized in [place] 11/04/2016 until 11/08/2016 due to septicemia and DKA. Diagnosed with Klebsiella pneumonia bacteremia and treated aggressively with IV hydration and IV antibiotics. This was felt to be secondary to a biliary source. He had chest imaging which suggested a retained biliary stent as well as a 4 cm liver lesion which was incompletely characterized. He also had elevated liver enzymes. He was discharged on Cipro and metronidazole 500 mg twice a day for both medications. Suspect orthostatic in Office due to lack of PO intake. Pt states that he has been having N/V since last night. No abdominal pain. Pt with cholecystectomy. States that he thinks the ABX might have upset his stomach. Denies diarrhea. No CP/SOB. No fever. Pt notes increasing fatigue recently with lack of PO intake. Pt states he just doesn't feel thirsty. Notes drinking 1-2 glasses of water a day. Pt currently on Cipro/Flagyl and will stop in 5 days. Having removal of hepatic drain on Thursday. No other symptoms noted.   Past Medical History:  Diagnosis Date  . Bacteremia due to Klebsiella pneumoniae   . Cholecystitis 04/2013  . Diabetes mellitus without complication (HCC)   . Elevated LFTs   . Goiter    Dr. Andrena Mews IM-Negative workup including biopsy of thyroid which was normal; brother has Graves and mother has thyroid disease  . History of kidney stones   . Hypertension   . Pancreatitis   . Pleural  effusion   . Pneumonia   . Septic shock [facility])     Patient Active Problem List   Diagnosis Date Noted  . Choledocholithiasis   . Encounter for removal of biliary stent   . Pancreatic pseudocyst 09/05/2013  . Decreased thyroxine-3  level 05/26/2013  . Goiter 05/22/2013  . Renal cyst 05/12/2013  . DM (diabetes mellitus) (HCC) 05/09/2013  . HLD (hyperlipidemia) 05/09/2013  . Diabetes mellitus (HCC) 10/03/2010    Past Surgical History:  Procedure Laterality Date  . CHOLECYSTECTOMY N/A 05/17/2013   Procedure: LAPAROSCOPIC CHOLECYSTECTOMY WITH INTRAOPERATIVE CHOLANGIOGRAM;  Surgeon: Cherylynn Ridges, MD;  Location: [facility] OR;  Service: General;  Laterality: N/A;  . ERCP N/A 05/10/2013   Procedure: ENDOSCOPIC RETROGRADE CHOLANGIOPANCREATOGRAPHY (ERCP);  Surgeon: Theda Belfast, MD;  Location: [facility] ENDOSCOPY;  Service: Endoscopy;  Laterality: N/A;  . ERCP N/A 11/18/2016   Procedure: ENDOSCOPIC RETROGRADE CHOLANGIOPANCREATOGRAPHY (ERCP);  Surgeon: Hilarie Fredrickson, MD;  Location: [facility] ENDOSCOPY;  Service: Endoscopy;  Laterality: N/A;  . hepatic drain  11/13/2016   for hepatic abcess  . LAPAROSCOPIC CHOLECYSTECTOMY  05/17/13   Dr. Lindie Spruce       Home Medications    Prior to Admission medications   Medication Sig Start Date End Date Taking? Authorizing Provider  cholecalciferol (VITAMIN D) 1000 units tablet Take 1,000 Units by mouth daily.     [provider]  ciprofloxacin (CIPRO) 750 MG tablet Take 1 tablet (750 mg total) by mouth 2 (two) times daily. 11/18/16  Pyrtle, Carie Caddy, MD  glimepiride (AMARYL) 4 MG tablet TAKE 2 TABLETS BY MOUTH IN THE MORNING 09/30/16   Margaree Mackintosh, MD  glucose blood (BAYER CONTOUR NEXT TEST) test strip Use as instructed to check blood sugar once daily Dx code E11.9 02/14/16   Reather Littler, MD  lisinopril (PRINIVIL,ZESTRIL) 5 MG tablet TAKE 1 TABLET BY MOUTH IN THE MORNING 06/04/16   Margaree Mackintosh, MD  metFORMIN (GLUCOPHAGE) 1000 MG tablet Take 1 tablet by mouth two   times daily with meals 02/08/16   Margaree Mackintosh, MD  metroNIDAZOLE (FLAGYL) 500 MG tablet Take 1 tablet (500 mg total) by mouth 3 (three) times daily. 11/18/16   Pyrtle, Carie Caddy, MD  Multiple Vitamin (MULTIVITAMIN WITH MINERALS) TABS tablet Take 1 tablet by mouth daily.    [provider]  Omega-3 Fatty Acids (FISH OIL) 1000 MG CAPS Take 1 capsule by mouth daily.     [provider]  pioglitazone (ACTOS) 30 MG tablet Take 1 tablet (30 mg total) by mouth daily. 10/05/16   Margaree Mackintosh, MD    Family History Family History  Problem Relation Age of Onset  . Thyroid disease Mother        had thyroidectomy  . Thyroid disease Father        Has low thyroid in 2014  . Hypertension Father   . Thyroid disease Brother        Had radioactive iodine treatment  . Diabetes Paternal Uncle   . Colon cancer Neg Hx   . Esophageal cancer Neg Hx   . Stomach cancer Neg Hx     Social History Social History  Substance Use Topics  . Smoking status: Never Smoker  . Smokeless tobacco: Never Used  . Alcohol use No     Allergies   Albumin (human) and Buscopan [scopolamine]   Review of Systems Review of Systems ROS reviewed and all are negative for acute change except as noted in the HPI.  Physical Exam Updated Vital Signs BP 95/74 (BP Location: Right Arm)   Pulse 96   Temp 98.2 F (36.8 C) (Oral)   Resp 18   SpO2 98%   Physical Exam  Constitutional: He is oriented to person, place, and time. Vital signs are normal. He appears well-developed and well-nourished. No distress.  HENT:  Head: Normocephalic and atraumatic.  Right Ear: Hearing, tympanic membrane, external ear and ear canal normal.  Left Ear: Hearing, tympanic membrane, external ear and ear canal normal.  Nose: Nose normal.  Mouth/Throat: Uvula is midline, oropharynx is clear and moist and mucous membranes are normal. No trismus in the jaw. No oropharyngeal exudate, posterior oropharyngeal erythema or tonsillar  abscesses.  Eyes: Conjunctivae and EOM are normal. Pupils are equal, round, and reactive to light.  Neck: Normal range of motion. Neck supple. No tracheal deviation present.  Cardiovascular: Regular rhythm, S1 normal, S2 normal, normal heart sounds, intact distal pulses and normal pulses.  Tachycardia present.   Pulmonary/Chest: Effort normal and breath sounds normal. No respiratory distress. He has no decreased breath sounds. He has no wheezes. He has no rhonchi. He has no rales.  Abdominal: Normal appearance and bowel sounds are normal. There is no tenderness. There is no rigidity, no rebound, no guarding, no CVA tenderness, no tenderness at McBurney's point and negative Murphy's sign.  Musculoskeletal: Normal range of motion.  Neurological: He is alert and oriented to person, place, and time.  Skin: Skin is warm and dry.  Psychiatric: He has a normal mood and affect. His speech is normal and behavior is normal. Thought content normal.  Nursing note and vitals reviewed.  ED Treatments / Results  Labs (all labs ordered are listed, but only abnormal results are displayed) Labs Reviewed  COMPREHENSIVE METABOLIC PANEL - Abnormal; Notable for the following:       Result Value   Chloride 100 (*)    Glucose, Bld 211 (*)    BUN 24 (*)    Creatinine, Ser 1.98 (*)    ALT 16 (*)    GFR calc non Af Amer 38 (*)    GFR calc Af Amer 44 (*)    All other components within normal limits  CBC WITH DIFFERENTIAL/PLATELET - Abnormal; Notable for the following:    RBC 4.11 (*)    Hemoglobin 12.4 (*)    HCT 37.3 (*)    All other components within normal limits  URINALYSIS, ROUTINE W REFLEX MICROSCOPIC - Abnormal; Notable for the following:    Leukocytes, UA SMALL (*)    Bacteria, UA RARE (*)    All other components within normal limits  BASIC METABOLIC PANEL - Abnormal; Notable for the following:    Glucose, Bld 162 (*)    BUN 21 (*)    Creatinine, Ser 1.76 (*)    Calcium 8.1 (*)    GFR calc non Af  Amer 44 (*)    GFR calc Af Amer 51 (*)    All other components within normal limits  I-STAT CG4 LACTIC ACID, ED - Abnormal; Notable for the following:    Lactic Acid, Venous 1.92 (*)    All other components within normal limits  CULTURE, BLOOD (ROUTINE X 2)  CULTURE, BLOOD (ROUTINE X 2)  I-STAT CG4 LACTIC ACID, ED    EKG  EKG Interpretation None       Radiology Dg Chest 2 View  Result Date: 11/23/2016 CLINICAL DATA:  Onset of right pleural effusion 3 weeks ago. Assess for resolution. EXAM: CHEST  2 VIEW COMPARISON:  Chest x-ray of May 20, 2013 FINDINGS: The lungs are well-expanded. No definite pleural effusion is observed. The small caliber right chest tube is located in the posterior basilar aspect of the right pleural space. There is no pneumothorax. The left lung is clear. The heart and mediastinal structures are normal. The bony thorax is unremarkable. IMPRESSION: No right-sided pleural effusion is observed. The small caliber chest tube is in reasonable position. Electronically Signed   By: David  [place] M.D.   On: 11/23/2016 11:37    Procedures Procedures (including critical care time)  Medications Ordered in ED Medications  sodium chloride 0.9 % bolus 1,000 mL (0 mLs Intravenous Stopped 11/24/16 1333)    And  sodium chloride 0.9 % bolus 1,000 mL (0 mLs Intravenous Stopped 11/24/16 1347)     Initial Impression / Assessment and Plan / ED Course  I have reviewed the triage vital signs and the nursing notes.  Pertinent labs & imaging results that were available during my care of the patient were reviewed by me and considered in my medical decision making (see chart for details).  Final Clinical Impressions(s) / ED Diagnoses  {I have reviewed and evaluated the relevant laboratory values. {I have reviewed and evaluated the relevant imaging studies. {I have interpreted the relevant EKG. {I have reviewed the relevant previous healthcare records.  {I obtained HPI from historian.  {Patient discussed with supervising physician.  ED Course:  Assessment: Pt is a 51 y.o.  male hx of HTN, DM, presents to the Emergency Department today from PCP office due to hypotension and tachycardia. Per chart review, pt is post ERCP with sphincterotomy and CBD stone removal last week by Dr. Marina GoodellPerry. Drain was placed to aid in hepatic dome abscess drainage. [place] 11[place]04[place]2016 until 11/08/2016 due to septicemia and DKA. Diagnosed with Klebsiella pneumonia bacteremia and treated aggressively with IV hydration and IV antibiotics. This was felt to be secondary to a biliary source. He had chest imaging which suggested a retained biliary stent as well as a 4 cm liver lesion which was incompletely characterized. He also had elevated liver enzymes. He was discharged on Cipro and metronidazole 500 mg twice a day for both medications. Suspect orthostatic in Office due to lack of PO intake. Pt states that he has been having N/V since last night. No abdominal pain. Pt with cholecystectomy. States that he thinks the ABX might have upset his stomach. Denies diarrhea. No CP/SOB. No fever. Pt notes increasing fatigue recently with lack of PO intake. Pt states he just doesn't feel thirsty. Notes drinking 1-2 glasses of water a day. Pt currently on Cipro/Flagyl and will stop in 5 days. Having removal of hepatic drain on Thursday.  On exam, pt in Nontoxic/nonseptic appearing. VS with HR in low 100s. Afebrile. BP 90s systolic. Lungs CTA. Heart RRR. Abdomen nontender soft. Drain output intact. No erythema around site. No signs of infection. iStat Lactate 1.92. WBC negative. CMP with mild elevation in Cr as well as BUN. Suggestive of dehydration. Pt does have pale mucosa and dry. Glucose 211. No gap. Potassium WNL. No evidence of DKA. Will give fluids and recheck vitals and lactic acids. If normalized, likely 2/2 dehydration. Blood cultures were drawn.   3:31 PM- Creatine with improvement 1.76 after 2L NS bolus.  iStat Lactic 1.83 improved. Pt tolerating POs. Discussed results with patient and offered possibility of admission for fluid hydration, but pt wishes to return home. Given strict return precautions and close follow up. At time of discharge, Patient is in no acute distress. Vital Signs are stable. Patient is able to ambulate. Patient able to tolerate PO.  4:01 PM- Consult Medicine. Agree with plan for DC. At time of discharge, Patient is in no acute distress. Vital Signs are stable. Patient is able to ambulate. Patient able to tolerate PO.   Disposition/Plan:  DC Home Additional Verbal discharge instructions given and discussed with patient.  Pt Instructed to f/u with PCP in the next week for evaluation and treatment of symptoms. Return precautions given Pt acknowledges and agrees with plan  Supervising Physician Mancel BaleWentz, Elliott, MD  Final diagnoses:  Dehydration  Orthostatic hypotension    New Prescriptions New Prescriptions   No medications on file     Audry PiliMohr, Ewald Beg, Cordelia Poche-C 11/24/16 1602    Mancel BaleWentz, Elliott, MD 11/25/16 [phone]

## 2016-11-26 ENCOUNTER — Ambulatory Visit
Admission: RE | Admit: 2016-11-26 | Discharge: 2016-11-26 | Disposition: A | Discharge status: Home / Self Care | Payer: 59 | Source: Ambulatory Visit | Attending: Student | Admitting: Student

## 2016-11-26 ENCOUNTER — Other Ambulatory Visit (HOSPITAL_COMMUNITY): Payer: Self-pay | Admitting: Diagnostic Radiology

## 2016-11-26 ENCOUNTER — Other Ambulatory Visit (INDEPENDENT_AMBULATORY_CARE_PROVIDER_SITE_OTHER): Payer: 59

## 2016-11-26 ENCOUNTER — Other Ambulatory Visit: Payer: Self-pay | Admitting: Internal Medicine

## 2016-11-26 ENCOUNTER — Telehealth: Payer: Self-pay

## 2016-11-26 ENCOUNTER — Other Ambulatory Visit: Payer: Self-pay

## 2016-11-26 ENCOUNTER — Ambulatory Visit
Admission: RE | Admit: 2016-11-26 | Discharge: 2016-11-26 | Disposition: A | Discharge status: Home / Self Care | Payer: 59 | Source: Ambulatory Visit | Attending: Internal Medicine | Admitting: Internal Medicine

## 2016-11-26 DIAGNOSIS — K750 Abscess of liver: Secondary | ICD-10-CM | POA: Diagnosis not present

## 2016-11-26 HISTORY — PX: IR RADIOLOGIST EVAL & MGMT: IMG5224

## 2016-11-26 LAB — COMPREHENSIVE METABOLIC PANEL
ALT: 10 U/L (ref 0–53)
AST: 15 U/L (ref 0–37)
Albumin: 4 g/dL (ref 3.5–5.2)
Alkaline Phosphatase: 100 U/L (ref 39–117)
BUN: 15 mg/dL (ref 6–23)
CHLORIDE: 100 meq/L (ref 96–112)
CO2: 26 mEq/L (ref 19–32)
Calcium: 9.5 mg/dL (ref 8.4–10.5)
Creatinine, Ser: 1.68 mg/dL — ABNORMAL HIGH (ref 0.40–1.50)
GFR: 46.31 mL/min — AB (ref >60.00)
GLUCOSE: 127 mg/dL — AB (ref 70–99)
POTASSIUM: 3.8 meq/L (ref 3.5–5.1)
SODIUM: 136 meq/L (ref 135–145)
Total Bilirubin: 0.7 mg/dL (ref 0.2–1.2)
Total Protein: 7.2 g/dL (ref 6.0–8.3)

## 2016-11-26 IMAGING — CT CT ABD-PELV W/ CM
1 series · 14 of 32 positions shown, 17 images · IV contrast (& [ID] ISOVUE 300)
Comparison: MRI 11/11/2016

CLINICAL DATA: Follow-up liver abscess with a percutaneous drain.
Percutaneous drain was placed with ultrasound guidance on
11/13/2016. Recent ERCP with sphincterotomy and common duct stone
extraction. History of biliary pancreatitis.

EXAM:
CT ABDOMEN AND PELVIS WITH CONTRAST
TECHNIQUE: Multidetector CT imaging of the abdomen and pelvis was performed
using the standard protocol following bolus administration of
intravenous contrast.
CONTRAST:  100mL 1G18AT-VGG IOPAMIDOL (1G18AT-VGG) INJECTION 61%

[Series 3: abd/pelvis with · axial · 0.70mm/px · z∈[-378,+46]mm · 14 of 96 slices shown, 17 images]
[im 7/96  soft-tissue]
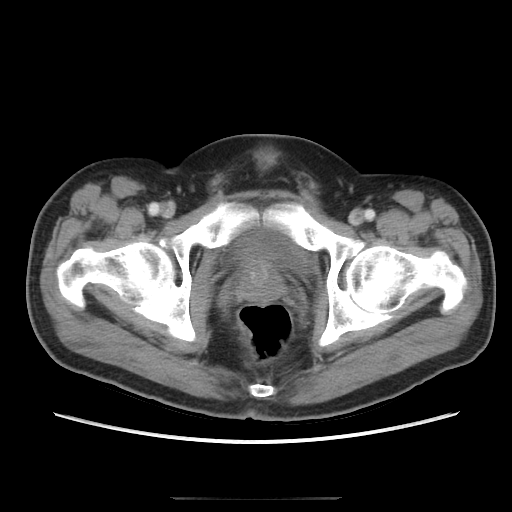
[im 7/96  bone]
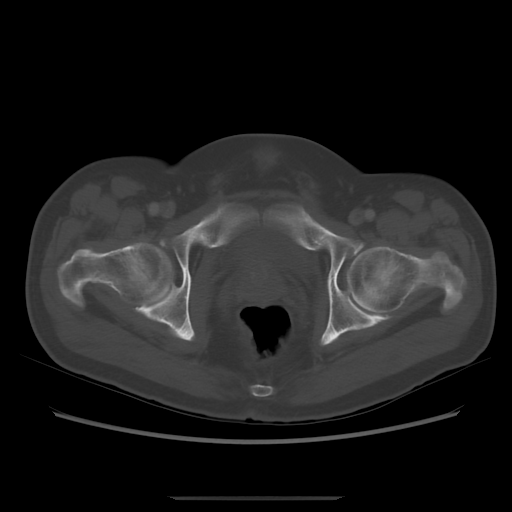
[im 16/96  soft-tissue]
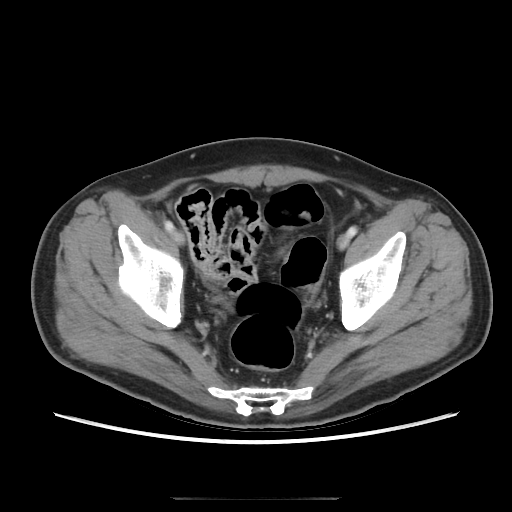
[im 22/96  soft-tissue]
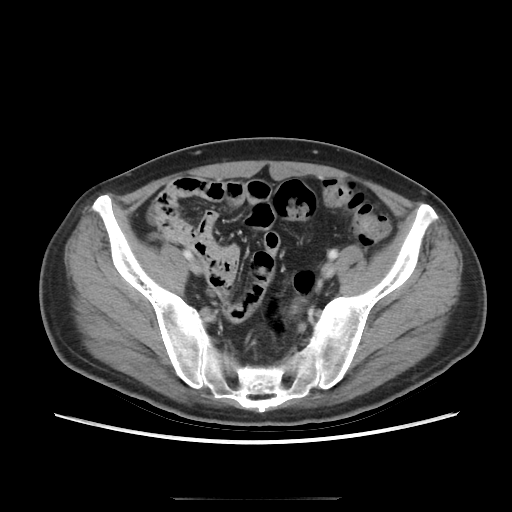
[im 31/96  soft-tissue]
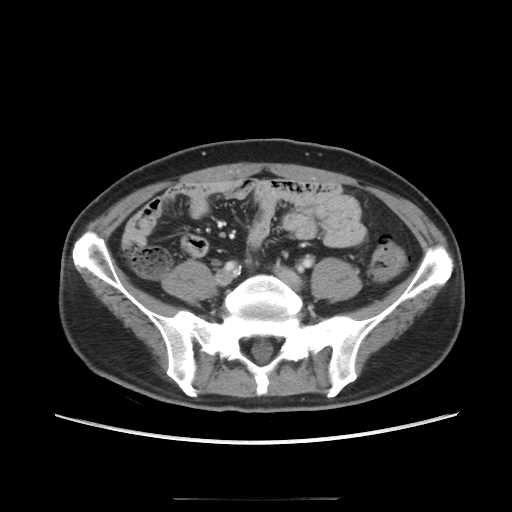
[im 40/96  soft-tissue]
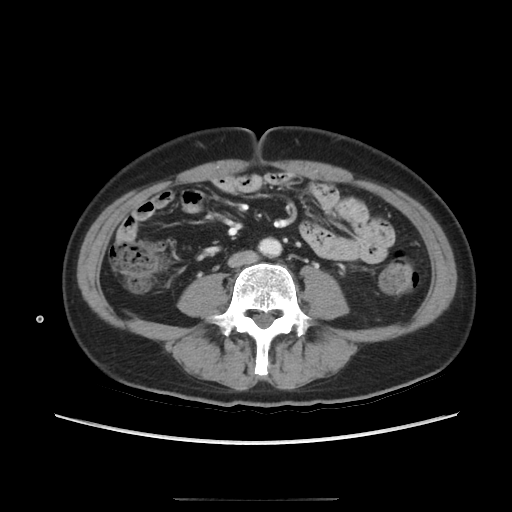
[im 50/96  soft-tissue]
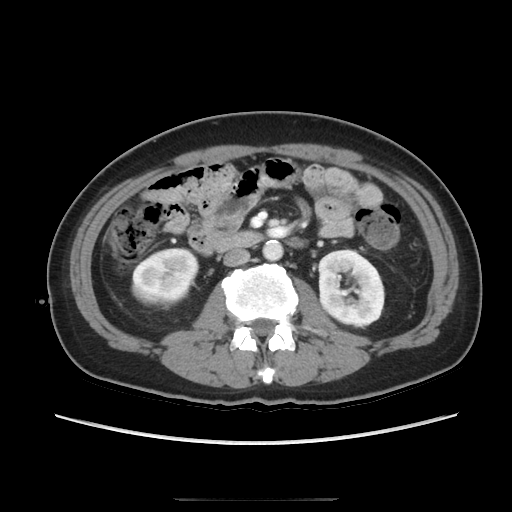
[im 56/96  soft-tissue]
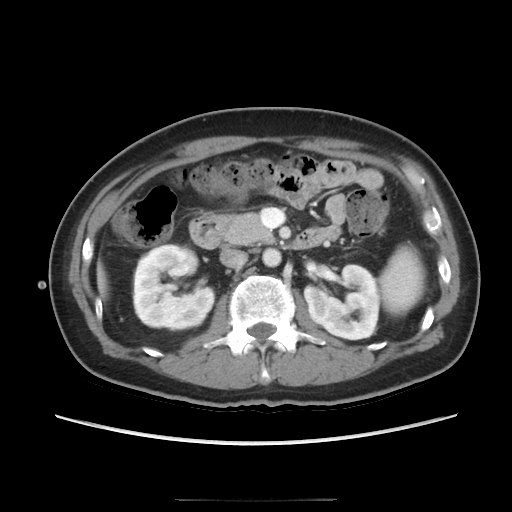
[im 65/96  soft-tissue]
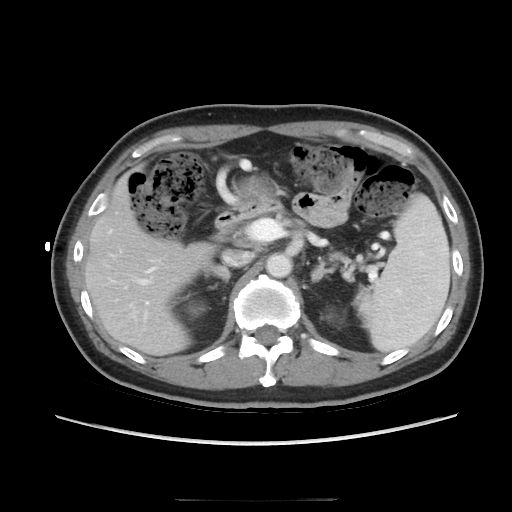
[im 74/96  soft-tissue]
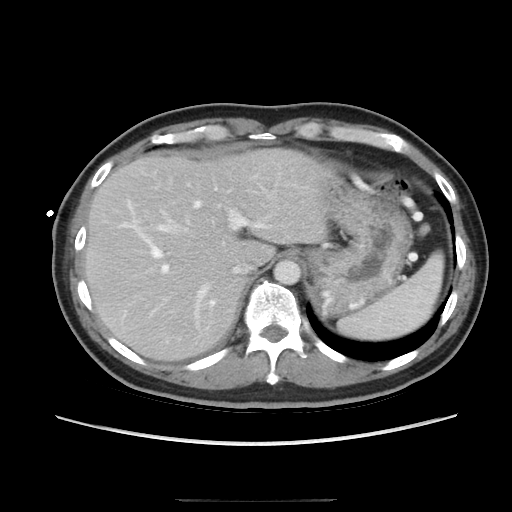
[im 74/96  bone]
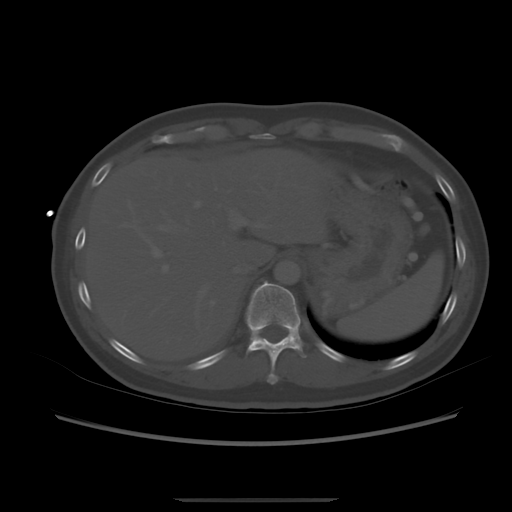
[im 80/96  soft-tissue]
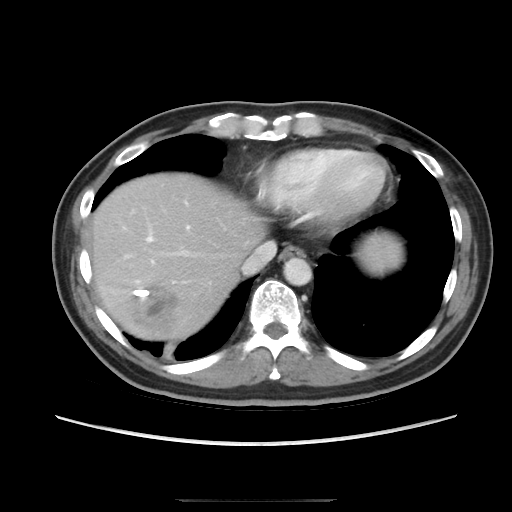
[im 83/96  lung]
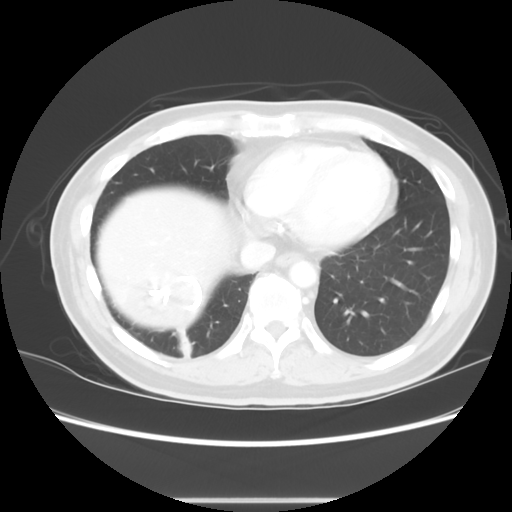
[im 86/96  lung]
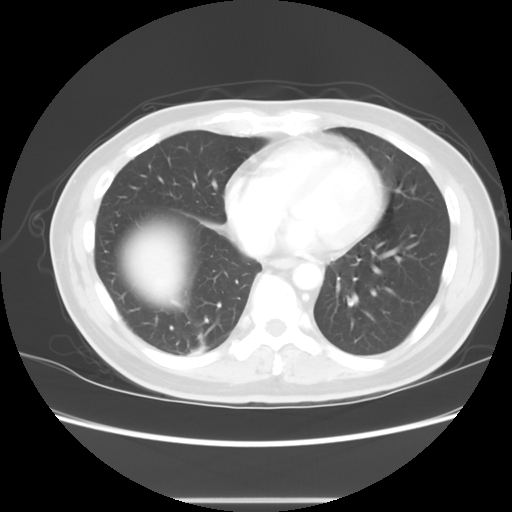
[im 89/96  soft-tissue]
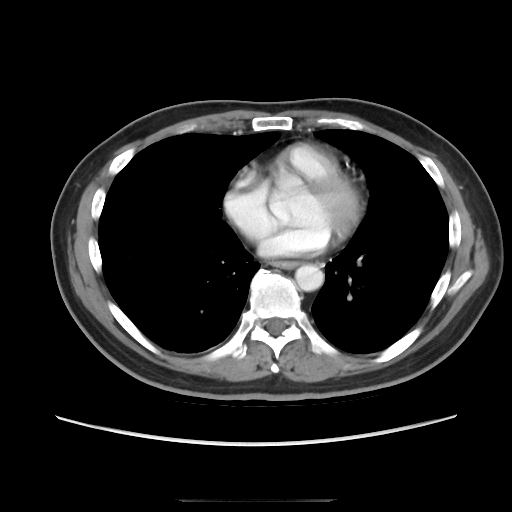
[im 89/96  lung]
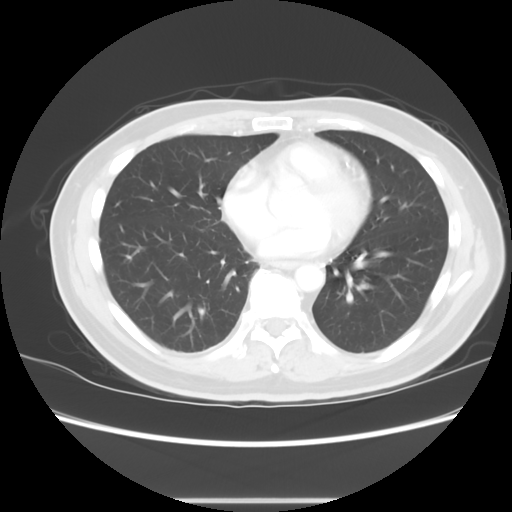
[im 92/96  lung]
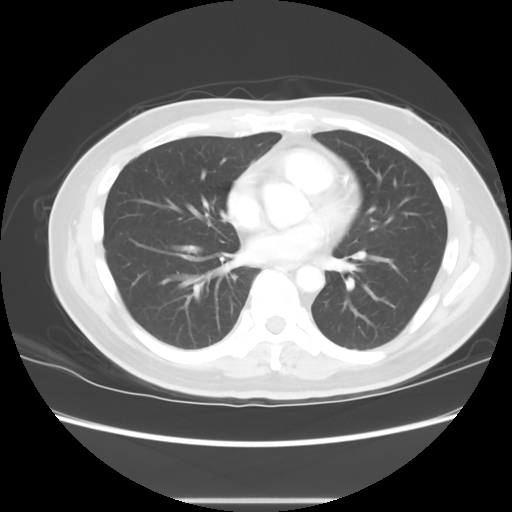

[14 of 32 positions shown; findings below may reference images not displayed]

FINDINGS: Lower chest: Atelectasis and question trace pleural fluid at the
right lung base.

Hepatobiliary: Poorly defined low-density structure at the posterior
right hepatic dome compatible with the hepatic abscess. Small
pockets of fluid and gas within the structure. A large portion of
this structure probably represents edema rather than fluid. This
area measures 4.2 x 3.8 cm on sequence 3, image 16 and previously
measured 5.8 x 3.6 cm on the prior MRI. The residual fluid pocket
measures up to 2.5 cm on the coronal images, sequence 601, and image
63. The drainage catheter is extending through the abscess but the
pigtail is cephalad and medial to the remaining fluid collection.
There is a punctate low-density area in the anterior right hepatic
lobe on sequence 2, image 24 which is less conspicuous or smaller
compared to the previous MRI. This area is indeterminate. No other
areas are concerning for a lesion or abscess collection. Main portal
vein and intrahepatic portal veins are patent. Gallbladder has been
removed. There is no significant biliary dilatation but the
extrahepatic biliary system has wall thickened thickening and this
is probably secondary to the recent ERCP and intervention. Biliary
wall thickening is best seen on the coronal images.

Pancreas: Patient no longer has a biliary stent. Mild fullness of
the pancreatic head without acute inflammatory changes. Pseudocysts
along the pancreatic tail have resolved.

Spleen: Spleen is prominent for size and stable.

Adrenals/Urinary Tract: Mild nodularity of the left adrenal gland is
stable. Normal appearance of the right adrenal gland. 3 mm
nonobstructive stone in left kidney interpolar region. No suspicious
renal lesions. No hydronephrosis. Urinary bladder is unremarkable.

Stomach/Bowel: No evidence for bowel dilatation or inflammation.
Moderate amount of stool in the colon. Normal appendix.

Vascular/Lymphatic: Mild atherosclerotic disease in the aorta and
iliac arteries. No aortic aneurysm. There is flow in the IMA, SMA
and celiac trunk. Chronic occlusion of the splenic vein likely
secondary to the pancreatitis. There are gastric varices due to the
splenic vein occlusion. Large collateral vein extending along the
anterior upper abdomen and draining back into the portal venous
system. No significant lymph node enlargement in the abdomen or
pelvis.

Reproductive: Prostate is unremarkable.

Other: No ascites.  No free air.

Musculoskeletal: Bilateral pars defects at L5 with minimal
anterolisthesis at L5-S1. Calcification or ossification along the
posterior disc at L4-L5.
IMPRESSION: Hepatic abscess is decreasing in size. Residual fluid collection is
just caudal and lateral to the existing pigtail drain. No new
hepatic abscesses. Patient is being scheduled for a drain injection
and possible drain repositioning.

Chronic occlusion of the splenic vein likely secondary to
pancreatitis. The patient has gastric varices due to the splenic
vein occlusion and collateral vessels.

Nonobstructive left kidney stone.

Wall thickening in the extrahepatic biliary system probably
secondary to recent ERCP and intervention. No evidence for acute
pancreatic inflammation.

## 2016-11-26 MED ORDER — CIPROFLOXACIN HCL 750 MG PO TABS: 750.0000 mg | ORAL_TABLET | Freq: Two times a day (BID) | ORAL | 0 refills | Status: DC

## 2016-11-26 MED ORDER — METRONIDAZOLE 500 MG PO TABS: 500.0000 mg | ORAL_TABLET | Freq: Three times a day (TID) | ORAL | 0 refills | Status: DC

## 2016-11-26 MED ORDER — IOPAMIDOL (ISOVUE-300) INJECTION 61%
100.0000 mL | Freq: Once | INTRAVENOUS | Status: AC | PRN
Start: 2016-11-26 — End: 2016-11-26
  Administered 2016-11-26: 100 mL via INTRAVENOUS

## 2016-11-26 NOTE — Telephone Encounter (Signed)
Called pt and he will be coming in tomorrow for blood work and nurse visit.

## 2016-11-26 NOTE — Consult Note (Signed)
Chief Complaint: Patient was seen in consultation today for  Chief Complaint  Patient presents with  . Follow-up    abscess drain   at the request of Matthews,Kacie Sue-Ellen  Referring Physician(s): Pyrtle, Vonna KotykJay  History of Present Illness: Nicolas Chandler is a 51 y.o. male with history of biliary pancreatitis and liver abscess. Drainage catheter was placed in liver abscess on 11/13/2016. Patient was recently seen in the emergency department for dehydration. Patient is now feeling fine. Nicolas Chandler denies fevers or chills. No significant abdominal pain. Nicolas Chandler has flushing the tube but there is minimal output.  Past Medical History:  Diagnosis Date  . Bacteremia due to Klebsiella pneumoniae   . Cholecystitis 04/2013  . Diabetes mellitus without complication (HCC)   . Elevated LFTs   . Goiter    [facility] including biopsy of thyroid which was normal; brother has Graves and mother has thyroid disease  . History of kidney stones   . Hypertension   . Pancreatitis   . Pleural effusion   . Pneumonia   . Septic shock [facility](HCC)     Past Surgical History:  Procedure Laterality Date  . CHOLECYSTECTOMY N/A 05/17/2013   Procedure: LAPAROSCOPIC CHOLECYSTECTOMY WITH INTRAOPERATIVE CHOLANGIOGRAM;  Surgeon: Cherylynn RidgesJames O Wyatt, MD;  Location: [facility] OR;  Service: General;  Laterality: N/A;  . ERCP N/A 05/10/2013   Procedure: ENDOSCOPIC RETROGRADE CHOLANGIOPANCREATOGRAPHY (ERCP);  Surgeon: Theda BelfastPatrick D Hung, MD;  [facility];  Service: Endoscopy;  Laterality: N/A;  . ERCP N/A 11/18/2016   Procedure: ENDOSCOPIC RETROGRADE CHOLANGIOPANCREATOGRAPHY (ERCP);  Surgeon: Hilarie FredricksonPerry, John N, MD;  [facility];  Service: Endoscopy;  Laterality: N/A;  . hepatic drain  11/13/2016   for hepatic abcess  . LAPAROSCOPIC CHOLECYSTECTOMY  05/17/13   Dr. Lindie SpruceWyatt    Allergies: Albumin (human) and Buscopan [scopolamine]  Medications: Prior to Admission medications   Medication Sig Start  Date End Date Taking? Authorizing Provider  ciprofloxacin (CIPRO) 750 MG tablet Take 1 tablet (750 mg total) by mouth 2 (two) times daily. 11/18/16  Yes Pyrtle, Carie CaddyJay M, MD  glimepiride (AMARYL) 4 MG tablet TAKE 2 TABLETS BY MOUTH IN THE MORNING 09/30/16  Yes Baxley, Luanna ColeMary J, MD  glucose blood (BAYER CONTOUR NEXT TEST) test strip Use as instructed to check blood sugar once daily Dx code E11.9 02/14/16  Yes Reather LittlerKumar, Ajay, MD  lisinopril (PRINIVIL,ZESTRIL) 5 MG tablet TAKE 1 TABLET BY MOUTH IN THE MORNING Patient taking differently: TAKE 1 TABLET BY MOUTH EVERY EVENING 06/04/16  Yes Margaree MackintoshBaxley, Mary J, MD  metFORMIN (GLUCOPHAGE) 1000 MG tablet Take 1 tablet by mouth two  times daily with meals 02/08/16  Yes Baxley, Luanna ColeMary J, MD  pioglitazone (ACTOS) 30 MG tablet Take 1 tablet (30 mg total) by mouth daily. 10/05/16  Yes Baxley, Luanna ColeMary J, MD  cholecalciferol (VITAMIN D) 1000 units tablet Take 1,000 Units by mouth daily.     [provider]  metroNIDAZOLE (FLAGYL) 500 MG tablet Take 1 tablet (500 mg total) by mouth 3 (three) times daily. Patient not taking: Reported on 11/26/2016 11/18/16   Beverley FiedlerPyrtle, Jay M, MD  Multiple Vitamin (MULTIVITAMIN WITH MINERALS) TABS tablet Take 1 tablet by mouth daily.    [provider]  Omega-3 Fatty Acids (FISH OIL) 1000 MG CAPS Take 1 capsule by mouth daily.     [provider]     Family History  Problem Relation Age of Onset  . Thyroid disease Mother        had  thyroidectomy  . Thyroid disease Father        Has low thyroid in 2014  . Hypertension Father   . Thyroid disease Brother        Had radioactive iodine treatment  . Diabetes Paternal Uncle   . Colon cancer Neg Hx   . Esophageal cancer Neg Hx   . Stomach cancer Neg Hx     Social History   Social History  . Marital status: Married    Spouse name: N/A  . Number of children: N/A  . Years of education: N/A   Social History Main Topics  . Smoking status: Never Smoker  . Smokeless tobacco: Never  Used  . Alcohol use No  . Drug use: No  . Sexual activity: Not on file   Other Topics Concern  . Not on file   Social History Narrative  . No narrative on file      Review of Systems  Constitutional: Negative for chills and fatigue.  Respiratory: Negative.   Cardiovascular: Negative.   Gastrointestinal: Negative for abdominal distention and abdominal pain.    Vital Signs: BP 90/64   Pulse 96   Temp 98.2 F (36.8 C)   Resp 14   Ht 5\' 6"  (1.676 m)   Wt 140 lb (63.5 kg)   SpO2 100%   BMI 22.60 kg/m   Physical Exam  Constitutional: Nicolas Chandler appears well-developed. No distress.  Abdominal: Soft. Nicolas Chandler exhibits no distension. There is no tenderness.  The right abdominal abscess drain is intact. The drain flushes easily but cannot aspirate any fluid. Small amount of yellow fluid within the suction bulb.         Imaging: Dg Chest 2 View  Result Date: 11/23/2016 CLINICAL DATA:  Onset of right pleural effusion 3 weeks ago. Assess for resolution. EXAM: CHEST  2 VIEW COMPARISON:  Chest x-ray of May 20, 2013 FINDINGS: The lungs are well-expanded. No definite pleural effusion is observed. The small caliber right chest tube is located in the posterior basilar aspect of the right pleural space. There is no pneumothorax. The left lung is clear. The heart and mediastinal structures are normal. The bony thorax is unremarkable. IMPRESSION: No right-sided pleural effusion is observed. The small caliber chest tube is in reasonable position. Electronically Signed   By: David  [place] M.D.   On: 11/23/2016 11:37   Ct Abdomen Pelvis W Contrast  Result Date: 11/26/2016 CLINICAL DATA:  Follow-up liver abscess with a percutaneous drain. Percutaneous drain was placed with ultrasound guidance on 11/13/2016. Recent ERCP with sphincterotomy and common duct stone extraction. History of biliary pancreatitis. EXAM: CT ABDOMEN AND PELVIS WITH CONTRAST TECHNIQUE: Multidetector CT imaging of the abdomen and  pelvis was performed using the standard protocol following bolus administration of intravenous contrast. CONTRAST:  ISOVUE-300 IOPAMIDOL (ISOVUE-300) INJECTION 61% COMPARISON:  MRI 11/11/2016 FINDINGS: Lower chest: Atelectasis and question trace pleural fluid at the right lung base. Hepatobiliary: Poorly defined low-density structure at the posterior right hepatic dome compatible with the hepatic abscess. Small pockets of fluid and gas within the structure. A large portion of this structure probably represents edema rather than fluid. This area measures 4.2 x 3.8 cm on sequence 3, image 16 and previously measured 5.8 x 3.6 cm on the prior MRI. The residual fluid pocket measures up to 2.5 cm on the coronal images, sequence 601, and image 63. The drainage catheter is extending through the abscess but the pigtail is cephalad and medial to the remaining fluid collection.  There is a punctate low-density area in the anterior right hepatic lobe on sequence 2, image 24 which is less conspicuous or smaller compared to the previous MRI. This area is indeterminate. No other areas are concerning for a lesion or abscess collection. Main portal vein and intrahepatic portal veins are patent. Gallbladder has been removed. There is no significant biliary dilatation but the extrahepatic biliary system has wall thickened thickening and this is probably secondary to the recent ERCP and intervention. Biliary wall thickening is best seen on the coronal images. Pancreas: Patient no longer has a biliary stent. Mild fullness of the pancreatic head without acute inflammatory changes. Pseudocysts along the pancreatic tail have resolved. Spleen: Spleen is prominent for size and stable. Adrenals/Urinary Tract: Mild nodularity of the left adrenal gland is stable. Normal appearance of the right adrenal gland. 3 mm nonobstructive stone in left kidney interpolar region. No suspicious renal lesions. No hydronephrosis. Urinary bladder is  unremarkable. Stomach/Bowel: No evidence for bowel dilatation or inflammation. Moderate amount of stool in the colon. Normal appendix. Vascular/Lymphatic: Mild atherosclerotic disease in the aorta and iliac arteries. No aortic aneurysm. There is flow in the IMA, SMA and celiac trunk. 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 Large collateral vein extending along the anterior upper abdomen and draining back into the portal venous system. No significant lymph node enlargement in the abdomen or pelvis. Reproductive: Prostate is unremarkable. Other: No ascites.  No free air. 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 Calcification or ossification along the posterior disc at L4-L5. IMPRESSION: Hepatic abscess is decreasing in size. Residual fluid collection is just caudal and lateral to the existing pigtail drain. No new hepatic abscesses. Patient is being scheduled for a drain injection and possible drain repositioning. 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 Nonobstructive left kidney stone. Wall thickening in the extrahepatic biliary system probably secondary to recent ERCP and intervention. No evidence for acute pancreatic inflammation. Electronically Signed   By: Richarda Overlie M.D.   On: 11/26/2016 10:52   Mr 3d Recon At Scanner  Result Date: 11/12/2016 CLINICAL DATA:  Sepsis Sir or a cyst NG EXAM: MRI ABDOMEN WITHOUT AND WITH CONTRAST (INCLUDING MRCP) TECHNIQUE: Multiplanar multisequence MR imaging of the abdomen was performed both before and after the administration of intravenous contrast. Heavily T2-weighted images of the biliary and pancreatic ducts were obtained, and three-dimensional MRCP images were rendered by post processing. CONTRAST:  14mL  MULTIHANCE GADOBENATE DIMEGLUMINE 529 MG/ML IV SOLN COMPARISON:  CT abdomen 08/16/2013 FINDINGS: Lower chest:  Lung bases are clear. Hepatobiliary: Ovoid lesion in the posterior RIGHT hepatic lobe measures 5.8 x 3.6 cm (image 9, series 8). This lesion is hyperintense on T2 weighted imaging and demonstrates restricted diffusion (series 4 and series 8). Lesion is hypointense on the precontrast T1 weighted imaging. Postcontrast imaging demonstrates peripheral enhancement as well as enhancing internal thickened septations (image 24, series 1502). Second lesion which is hyperintense on T2 weighted imaging (image 15, series 8) which also demonstrates peripheral enhancement measuring 10 mm (image 35, series 1503) within segment 8 of the RIGHT hepatic lobe. There is no intrahepatic duct dilatation. MRCP imaging demonstrates a linear LEFT filling defect within the common bile duct measuring approximately 4 cm (image 26, series 7)). This is difficult to define and best seen  on the FIESTA series (series 13, image 17). Common bile duct is mildly dilated at 8 mm. Pancreas: There is fullness in the pancreatic head however no mass lesion identified. This pancreatic head fullness has equal signal intensity to the normal pancreatic parenchyma on all sequences (image 61, series 1503). Spleen: Normal spleen. The splenic vein is narrowed. Venous collaterals are present. Adrenals/urinary tract: Adrenal glands and kidneys are normal. Stomach/Bowel: Stomach and limited of the small bowel is unremarkable Vascular/Lymphatic: Abdominal aortic normal caliber. No retroperitoneal periportal lymphadenopathy. Musculoskeletal: No aggressive osseous lesion IMPRESSION: 1. Ovoid mass in the RIGHT hepatic lobe with enhancing septations is most suggestive of HEPATIC ABSCESS. A second smaller lesion in the RIGHT hepatic lobe is also favored abscess. Less favored differential would include a biliary cystadenoma/cystadenocarcinoma or metastasis. 2. No  intrahepatic biliary duct dilatation. Common bile duct minimally dilated to 8 mm. Linear filling defect within the common bile duct is poorly characterized by MRI but favored to represent the reported retained biliary stent. 3. Fullness within the pancreatic head favored to represent normal variation. These results will be called to the ordering clinician or representative by the Radiologist Assistant, and communication documented in the PACS or zVision Dashboard. Electronically Signed   By: Genevive Bi M.D.   On: 11/12/2016 08:22   Dg Ercp Biliary & Pancreatic Ducts  Result Date: 11/18/2016 CLINICAL DATA:  51 year old male undergoing ERCP EXAM: ERCP TECHNIQUE: Multiple spot images obtained with the fluoroscopic device and submitted for interpretation post-procedure. FLUOROSCOPY TIME:  Fluoroscopy Time:  5 minutes 30 seconds reported COMPARISON:  abdominal ultrasound 11/13/2016 FINDINGS: A total of 12 intraoperative saved images demonstrate a flexible endoscope in the descending duodenum with wire cannulation of the common bile duct. Subsequent cholangiogram demonstrates mild common duct dilatation. Filling defects in the mid duct may represent choledocholithiasis. Subsequent images demonstrate sphincterotomy and balloon sweep of the common duct. IMPRESSION: ERCP with sphincterotomy and balloon sweep of the common duct as described above. These images were submitted for radiologic interpretation only. Please see the procedural report for the amount of contrast and the fluoroscopy time utilized. Electronically Signed   By: Malachy Moan M.D.   On: 11/18/2016 12:26   Mr Abdomen Mrcp Vivien Rossetti Contast  Result Date: 11/12/2016 CLINICAL DATA:  Sepsis Sir or a cyst NG EXAM: MRI ABDOMEN WITHOUT AND WITH CONTRAST (INCLUDING MRCP) TECHNIQUE: Multiplanar multisequence MR imaging of the abdomen was performed both before and after the administration of intravenous contrast. Heavily T2-weighted images of the biliary  and pancreatic ducts were obtained, and three-dimensional MRCP images were rendered by post processing. CONTRAST:  14mL MULTIHANCE GADOBENATE DIMEGLUMINE 529 MG/ML IV SOLN COMPARISON:  CT abdomen 08/16/2013 FINDINGS: Lower chest:  Lung bases are clear. Hepatobiliary: Ovoid lesion in the posterior RIGHT hepatic lobe measures 5.8 x 3.6 cm (image 9, series 8). This lesion is hyperintense on T2 weighted imaging and demonstrates restricted diffusion (series 4 and series 8). Lesion is hypointense on the precontrast T1 weighted imaging. Postcontrast imaging demonstrates peripheral enhancement as well as enhancing internal thickened septations (image 24, series 1502). Second lesion which is hyperintense on T2 weighted imaging (image 15, series 8) which also demonstrates peripheral enhancement measuring 10 mm (image 35, series 1503) within segment 8 of the RIGHT hepatic lobe. There is no intrahepatic duct dilatation. MRCP imaging demonstrates a linear LEFT filling defect within the common bile duct measuring approximately 4 cm (image 26, series 7)). This is difficult to define and best seen on the FIESTA series (series  13, image 17). Common bile duct is mildly dilated at 8 mm. Pancreas: There is fullness in the pancreatic head however no mass lesion identified. This pancreatic head fullness has equal signal intensity to the normal pancreatic parenchyma on all sequences (image 61, series 1503). Spleen: Normal spleen. The splenic vein is narrowed. Venous collaterals are present. Adrenals/urinary tract: Adrenal glands and kidneys are normal. Stomach/Bowel: Stomach and limited of the small bowel is unremarkable Vascular/Lymphatic: Abdominal aortic normal caliber. No retroperitoneal periportal lymphadenopathy. Musculoskeletal: No aggressive osseous lesion IMPRESSION: 1. Ovoid mass in the RIGHT hepatic lobe with enhancing septations is most suggestive of HEPATIC ABSCESS. A second smaller lesion in the RIGHT hepatic lobe is also  favored abscess. Less favored differential would include a biliary cystadenoma/cystadenocarcinoma or metastasis. 2. No intrahepatic biliary duct dilatation. Common bile duct minimally dilated to 8 mm. Linear filling defect within the common bile duct is poorly characterized by MRI but favored to represent the reported retained biliary stent. 3. Fullness within the pancreatic head favored to represent normal variation. These results will be called to the ordering clinician or representative by the Radiologist Assistant, and communication documented in the PACS or zVision Dashboard. Electronically Signed   By: Genevive Bi M.D.   On: 11/12/2016 08:22   [place] Image Guided Drainage Percut Cath  Peritoneal Retroperit  Result Date: 11/13/2016 INDICATION: Posterior right hepatic dome hepatic abscess by imaging. EXAM: ULTRASOUND RIGHT HEPATIC ABSCESS DRAIN INSERTION MEDICATIONS: The patient is currently on oral antibiotics. ANESTHESIA/SEDATION: Fentanyl 100 mcg IV; Versed 1.0 mg IV Moderate Sedation Time:  18 minutes The patient was continuously monitored during the procedure by the interventional radiology nurse under my direct supervision. COMPLICATIONS: None immediate. PROCEDURE: Informed written consent was obtained from the patient after a thorough discussion of the procedural risks, benefits and alternatives. All questions were addressed. Maximal Sterile Barrier Technique was utilized including caps, mask, sterile gowns, sterile gloves, sterile drape, hand hygiene and skin antiseptic. A timeout was performed prior to the initiation of the procedure. Previous imaging reviewed. Patient positioned right anterior oblique. Ultrasound localization performed. The complex hepatic fluid collection in the right hepatic dome was localized. Lower intercostal space marked. Under sterile conditions and local anesthesia, a 17 gauge 11.8 cm access needle was advanced percutaneously from a transhepatic approach into the hepatic  abscess by ultrasound. Needle position confirmed. Guidewire advanced. Tract dilatation performed to insert a 10 [place] drain. Drain catheter position confirmed with ultrasound. Images obtained for documentation. Syringe aspiration yielded 20 cc bloody exudative fluid. Sample sent for Gram stain and culture. Catheter secured with a Prolene suture and connected to external suction bulb. Patient tolerated the procedure well. No complication. IMPRESSION: Successful ultrasound right hepatic dome abscess drain insertion. Electronically Signed   By: Judie Petit.  Shick M.D.   On: 11/13/2016 10:07    Labs:  CBC:  Recent Labs  11/11/16 0920 11/13/16 0729 11/23/16 1225 11/24/16 1100  WBC 9.9 11.2* 10.5 9.4  HGB 12.7* 11.9* 12.4* 12.4*  HCT 37.1* 35.9* 37.6* 37.3*  PLT 188.0 205 324 316    COAGS:  Recent Labs  11/11/16 0920 11/13/16 0729  INR 1.1* 0.88  APTT  --  33    BMP:  Recent Labs  11/23/16 1225 11/24/16 1100 11/24/16 1439 11/26/16 1039  NA 135 135 136 136  K 4.4 4.7 4.3 3.8  CL 100 100* 105 100  CO2 25 24 23 26   GLUCOSE 285* 211* 162* 127*  BUN 17 24* 21* 15  CALCIUM 9.0 9.6  8.1* 9.5  CREATININE 1.32 1.98* 1.76* 1.68*  GFRNONAA 63 38* 44*  --   GFRAA 73 44* 51*  --     LIVER FUNCTION TESTS:  Recent Labs  11/11/16 0920 11/23/16 1225 11/24/16 1100 11/26/16 1039  BILITOT 1.0 0.8 1.1 0.7  AST 21 15 21 15   ALT 50 12 16* 10  ALKPHOS 197* 124* 124 100  PROT 6.3 6.6 7.4 7.2  ALBUMIN 3.6 3.8 3.6 4.0    TUMOR MARKERS: No results for input(s): AFPTM, CEA, CA199, CHROMGRNA in the last 8760 hours.  Assessment and Plan:  51 year old with a liver abscess and percutaneous drainage catheter. Patient is asymptomatic and reports minimal output from the drain. CT scan from today suggests that the liver abscess is decreasing in size. There continues to be undrained fluid within the collection. Although the drain is extending through the abscess, the pigtail is just medial and  cephalad to the residual fluid. Nicolas Chandler was unable to aspirate any of this fluid. The patient would benefit from a drain injection under fluoroscopy and suspect that the drain will need to be pulled back or repositioned within the residual fluid pocket. This plan has been discussed with the patient. Patient is agreeable to undergo drain injection and possible drain manipulation at the hospital tomorrow. Patient felt that Nicolas Chandler could undergo the procedure without moderate sedation.  Plan for drain injection and possible drain repositioning in the liver abscess.   Thank you for this interesting consult.  Nicolas Chandler greatly enjoyed meeting Nicolas Chandler and look forward to participating in their care.  A copy of this report was sent to the requesting provider on this date.  Electronically Signed: Abundio Miu 11/26/2016, 12:25 PM   Nicolas Chandler spent a total of    10 Minutes in face to face in clinical consultation, greater than 50% of which was counseling/coordinating care for a liver abscess.

## 2016-11-26 NOTE — Telephone Encounter (Signed)
Called pt to check on him and see how he was doing since being discharged. He said that he is feeling much better, no N/V. Has been eating/drinking fluids fine. Blood sugar after dinner 11/25/16 was 215 and this morning 11/26/16 was 120. No problems or concerns right now.

## 2016-11-27 ENCOUNTER — Other Ambulatory Visit (HOSPITAL_COMMUNITY): Payer: Self-pay | Admitting: Diagnostic Radiology

## 2016-11-27 ENCOUNTER — Ambulatory Visit (INDEPENDENT_AMBULATORY_CARE_PROVIDER_SITE_OTHER): Payer: 59 | Admitting: Internal Medicine

## 2016-11-27 ENCOUNTER — Ambulatory Visit (HOSPITAL_COMMUNITY)
Admission: RE | Admit: 2016-11-27 | Discharge: 2016-11-27 | Disposition: A | Discharge status: Home / Self Care | Payer: 59 | Source: Ambulatory Visit | Attending: Diagnostic Radiology | Admitting: Diagnostic Radiology

## 2016-11-27 ENCOUNTER — Encounter: Payer: Self-pay | Admitting: Internal Medicine

## 2016-11-27 VITALS — BP 88/60 | HR 115 | Temp 98.0°F | Resp 26 | Wt 131.0 lb

## 2016-11-27 DIAGNOSIS — K750 Abscess of liver: Secondary | ICD-10-CM | POA: Diagnosis not present

## 2016-11-27 DIAGNOSIS — Z4803 Encounter for change or removal of drains: Secondary | ICD-10-CM | POA: Insufficient documentation

## 2016-11-27 DIAGNOSIS — R7989 Other specified abnormal findings of blood chemistry: Secondary | ICD-10-CM | POA: Diagnosis not present

## 2016-11-27 HISTORY — PX: IR CATHETER TUBE CHANGE: IMG717

## 2016-11-27 LAB — BASIC METABOLIC PANEL
BUN: 16 mg/dL (ref 7–25)
CALCIUM: 9 mg/dL (ref 8.6–10.3)
CO2: 25 mmol/L (ref 20–31)
Chloride: 104 mmol/L (ref 98–110)
Creat: 1.63 mg/dL — ABNORMAL HIGH (ref 0.60–1.35)
GLUCOSE: 121 mg/dL — AB (ref 65–99)
Potassium: 4.5 mmol/L (ref 3.5–5.3)
Sodium: 139 mmol/L (ref 135–146)

## 2016-11-27 MED ORDER — IOPAMIDOL (ISOVUE-300) INJECTION 61%
INTRAVENOUS | Status: AC
  Administered 2016-11-27: 10 mL
  Filled 2016-11-27: qty 50

## 2016-11-27 MED ORDER — LIDOCAINE HCL (PF) 1 % IJ SOLN
INTRAMUSCULAR | Status: DC | PRN
  Administered 2016-11-27: 5 mL

## 2016-11-27 MED ORDER — LIDOCAINE HCL (PF) 1 % IJ SOLN
INTRAMUSCULAR | Status: AC
  Filled 2016-11-27: qty 30

## 2016-11-27 NOTE — Progress Notes (Signed)
Patient came in for blood pressure recheck and for a STAT B-met to be drawn. Patient stated he was feeling well today, tolerated blood draw well.

## 2016-11-27 NOTE — Procedures (Signed)
Interventional Radiology Procedure Note  Procedure: Image guided drain injection and exchange of hepatic abscess drain.  Reposition into the abscess cavity. Small cavity.  To bulb suction. Retention suture placed. .  Complications: None Recommendations:  - continue current care - Do not submerge  - Routine care - Follow up with his physician's appointments, including any ID appt.  - May return to clinic in ~4 weeks with repeat CT and injection.     Signed,  Yvone NeuJaime S. Loreta AveWagner, DO

## 2016-11-27 NOTE — Patient Instructions (Signed)
STAT lab drawn today and rechecked blood pressure.

## 2016-11-29 LAB — CULTURE, BLOOD (ROUTINE X 2)
CULTURE: NO GROWTH
Culture: NO GROWTH
SPECIAL REQUESTS: ADEQUATE
SPECIAL REQUESTS: ADEQUATE

## 2016-11-30 ENCOUNTER — Other Ambulatory Visit: Payer: Self-pay | Admitting: Internal Medicine

## 2016-11-30 DIAGNOSIS — K750 Abscess of liver: Secondary | ICD-10-CM

## 2016-12-01 ENCOUNTER — Encounter: Payer: Self-pay | Admitting: Internal Medicine

## 2016-12-01 ENCOUNTER — Ambulatory Visit (INDEPENDENT_AMBULATORY_CARE_PROVIDER_SITE_OTHER): Payer: 59 | Admitting: Internal Medicine

## 2016-12-01 VITALS — BP 100/72 | HR 109 | Temp 98.0°F | Resp 24 | Wt 133.0 lb

## 2016-12-01 DIAGNOSIS — E869 Volume depletion, unspecified: Secondary | ICD-10-CM | POA: Diagnosis not present

## 2016-12-01 DIAGNOSIS — R7989 Other specified abnormal findings of blood chemistry: Secondary | ICD-10-CM

## 2016-12-01 DIAGNOSIS — E119 Type 2 diabetes mellitus without complications: Secondary | ICD-10-CM | POA: Diagnosis not present

## 2016-12-02 LAB — BASIC METABOLIC PANEL
BUN: 13 mg/dL (ref 7–25)
CHLORIDE: 101 mmol/L (ref 98–110)
CO2: 21 mmol/L (ref 20–31)
Calcium: 9.2 mg/dL (ref 8.6–10.3)
Creat: 1.7 mg/dL — ABNORMAL HIGH (ref 0.60–1.35)
Glucose, Bld: 223 mg/dL — ABNORMAL HIGH (ref 65–99)
POTASSIUM: 4.7 mmol/L (ref 3.5–5.3)
Sodium: 137 mmol/L (ref 135–146)

## 2016-12-05 ENCOUNTER — Other Ambulatory Visit: Payer: Self-pay | Admitting: Internal Medicine

## 2016-12-07 ENCOUNTER — Ambulatory Visit (INDEPENDENT_AMBULATORY_CARE_PROVIDER_SITE_OTHER): Payer: 59 | Admitting: Internal Medicine

## 2016-12-07 ENCOUNTER — Encounter: Payer: Self-pay | Admitting: Internal Medicine

## 2016-12-07 VITALS — BP 100/70 | HR 76 | Ht 66.0 in | Wt 128.0 lb

## 2016-12-07 DIAGNOSIS — K830 Cholangitis: Secondary | ICD-10-CM

## 2016-12-07 DIAGNOSIS — K750 Abscess of liver: Secondary | ICD-10-CM | POA: Diagnosis not present

## 2016-12-07 DIAGNOSIS — K8309 Other cholangitis: Secondary | ICD-10-CM

## 2016-12-07 NOTE — Patient Instructions (Addendum)
Please discontinue antibiotics.  Please follow up with Dr Rhea BeltonPyrtle on Wednesday, 01/27/17 @ 11:15 am.  If you are age 49 or older, your body mass index should be between 23-30. Your Body mass index is 20.66 kg/m. If this is out of the aforementioned range listed, please consider follow up with your Primary Care Provider.  If you are age 49 or younger, your body mass index should be between 19-25. Your Body mass index is 20.66 kg/m. If this is out of the aformentioned range listed, please consider follow up with your Primary Care Provider.

## 2016-12-07 NOTE — Progress Notes (Signed)
Subjective:    Patient ID: Nicolas Chandler, male    DOB: 03-09-1966, 51 y.o.   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 He also has a history of diabetes. He is here with his daughter today.  I saw him on 11/11/2016 after being hospitalized for gram-negative sepsis. Follow-up MRI with MRCP showed hepatic abscess and retained CBD stent. He was also having hiccups at that time. He went for percutaneous drainage with drainage catheter placement by IR on 11/13/2016. He was brought for ERCP on 11/18/2016 performed by Dr. Marina Goodell. During ERCP the previous biliary stent was removed and sphincterotomy with CBD stone extraction balloon was performed. He recovered well from this procedure. He then had manipulation of his percutaneous drain by IR on 11/27/2016 as there was some retained fluid in the hepatic abscess cavity. He has remained on Cipro and Flagyl.  He reports that he is feeling better. He is having issues with nausea primarily when he takes his antibiotics. He has had 2 episodes of non-bloody, nonbilious emesis. Stools have remained soft but nonbloody and non-melenic. He is not having any abdominal pain. Appetite has improved.  He brings a log of his total biliary drain output since placement on 11/13/2016. Since manipulation of the tube on 11/27/16 his output has been dramatically reducing. Output is as follows 11/28/2016 = 6 mL 11/29/2016 = 3.5 mL 11/30/2016 = 1 mL 12/01/2016 = 2 mL 12/02/2016 = 1.5 mL 12/03/2016 = 1 mL 12/04/2016 = 0.5 mL 12/06/2016 = 1 mL  Review of Systems As per HPI, otherwise negative  Current Medications, Allergies, Past Medical History, Past Surgical History, Family History and Social History were reviewed in [employer]  record.     Objective:   Physical Exam BP 100/70   Pulse 76   Ht 5\' 6"  (1.676 m)   Wt 128 lb (58.1 kg)   BMI 20.66 kg/m  Constitutional: Well-developed and well-nourished. No distress. HEENT: Normocephalic and atraumatic. Oropharynx is clear and moist. Conjunctivae are normal.  No scleral icterus. Neck: Neck supple. Trachea midline. Cardiovascular: Normal rate, regular rhythm and intact distal pulses. No M/R/G Pulmonary/chest: Effort normal and breath sounds normal. No wheezing, rales or rhonchi. Abdominal: Soft, nontender, nondistended. Bowel sounds active throughout. There are no masses palpable. No hepatosplenomegaly. Drainage catheter in place without erythema right side Extremities: no clubbing, cyanosis, or edema Neurological: Alert and oriented to person place and time. Skin: Skin is warm and dry. Psychiatric: Normal mood and affect. Behavior is normal.  CBC    Component Value Date/Time   WBC 9.4 11/24/2016 1100   RBC 4.11 (L) 11/24/2016 1100   HGB 12.4 (L) 11/24/2016 1100   HCT 37.3 (L) 11/24/2016 1100   PLT 316 11/24/2016 1100   MCV 90.8 11/24/2016 1100   MCH 30.2 11/24/2016 1100   MCHC 33.2 11/24/2016 1100   RDW 13.5 11/24/2016 1100   LYMPHSABS 2.6 11/24/2016 1100   MONOABS 0.6 11/24/2016 1100   EOSABS 0.2 11/24/2016 1100   BASOSABS 0.0 11/24/2016 1100   CMP     Component Value Date/Time   NA 137 12/01/2016 1225   K 4.7 12/01/2016 1225   CL 101 12/01/2016 1225   CO2 21 12/01/2016 1225   GLUCOSE 223 (H) 12/01/2016 1225   BUN 13 12/01/2016 1225  CREATININE 1.70 (H) 12/01/2016 1225   CALCIUM 9.2 12/01/2016 1225   PROT 7.2 11/26/2016 1039   ALBUMIN 4.0 11/26/2016 1039   AST 15 11/26/2016 1039   ALT 10 11/26/2016 1039   ALKPHOS 100 11/26/2016 1039   BILITOT 0.7 11/26/2016 1039   GFRNONAA 44 (L) 11/24/2016 1439   GFRNONAA 63 11/23/2016 1225   GFRAA 51 (L) 11/24/2016 1439   GFRAA 73 11/23/2016 1225     IR CATHETER TUBE CHANGE   MEDICATIONS: The  patient is currently admitted to the hospital and receiving intravenous antibiotics. The antibiotics were administered within an appropriate time frame prior to the initiation of the procedure.   ANESTHESIA/SEDATION: None   COMPLICATIONS: None   PROCEDURE: Informed written consent was obtained from the patient after a thorough discussion of the procedural risks, benefits and alternatives. All questions were addressed. Maximal Sterile Barrier Technique was utilized including caps, mask, sterile gowns, sterile gloves, sterile drape, hand hygiene and skin antiseptic. A timeout was performed prior to the initiation of the procedure.   Patient positioned supine position on the fluoroscopy table. The right upper quadrant was prepped and draped in the usual sterile fashion including the indwelling drain.   Scout images were acquired.   Small injection was performed through the indwelling drain. The drain was then ligated and removed over a guidewire. Short [place]pe the catheter was then placed over the guidewire into the abscess cavity for attempted wire placement. The hepatic tissue was dense in the region, and the wire would not formed in the largest component of the abscess.   A new 10 French drain was positioned near the largest component of the pathic abscess and a final image was stored.   Catheter was attached to bulb suction.   Patient tolerated the procedure well and remained hemodynamically stable throughout.   No complications were encountered and no significant blood loss.   IMPRESSION: Status post exchange of 10 French drainage catheter into hepatic abscess. Attached to bulb suction.   Signed,   Yvone NeuJaime S. Loreta AveWagner, DO   [facility]   [facility]     Electronically Signed   By: Gilmer MorJaime  Wagner D.O.   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  1. Biliary sepsis due to retained CBD stent/hepatic abscess -- patient has improved significantly but energy levels have not returned to normal. His nausea is very likely secondary to antibiotic therapy, most likely metronidazole. I am stopping antibiotics today given that his retained CBD stent has been removed, he has been more than adequately treated for bacteremia and his hepatic abscess has been drained. I asked that he notify me immediately if he develops recurrent fever, abdominal pain or in general feels worse. He voices understanding. In the interim he will continue to flush drain once daily per IR instruction. Given the very minimal drainage as documented above he may be able to have this drain removed sooner. Currently scheduled for repeat CT and probable drain removal on 12/23/2016  I would like to see him back in 6-8 weeks, sooner if necessary  25 minutes spent with the patient today. Greater than 50% was spent in counseling and coordination of care with the patient

## 2016-12-09 ENCOUNTER — Ambulatory Visit
Admission: RE | Admit: 2016-12-09 | Discharge: 2016-12-09 | Disposition: A | Discharge status: Home / Self Care | Payer: 59 | Source: Ambulatory Visit | Attending: Interventional Radiology | Admitting: Interventional Radiology

## 2016-12-09 ENCOUNTER — Ambulatory Visit
Admission: RE | Admit: 2016-12-09 | Discharge: 2016-12-09 | Disposition: A | Discharge status: Home / Self Care | Payer: 59 | Source: Ambulatory Visit | Attending: Internal Medicine | Admitting: Internal Medicine

## 2016-12-09 ENCOUNTER — Other Ambulatory Visit: Payer: Self-pay | Admitting: Internal Medicine

## 2016-12-09 ENCOUNTER — Other Ambulatory Visit (HOSPITAL_COMMUNITY): Payer: Self-pay | Admitting: Interventional Radiology

## 2016-12-09 DIAGNOSIS — K750 Abscess of liver: Secondary | ICD-10-CM

## 2016-12-09 HISTORY — PX: IR RADIOLOGIST EVAL & MGMT: IMG5224

## 2016-12-09 NOTE — Progress Notes (Signed)
Patient ID: Nicolas Chandler, male   DOB: 12/05/1965, 51 y.o.   MRN: [id number]       Chief Complaint: Patient was seen in consultation today for  Chief Complaint  Patient presents with  . Follow-up    Drain follow up   at the request of Pyrtle,Jay M  Referring Physician(s): Pyrtle,Jay M  History of Present Illness: Nicolas Chandler is a 51 y.o. male who is in clinic today in follow-up for his hepatic abscess he was seen 2 weeks ago and had a small residual abscess cavity. Since then, the output has diminished to nearly 0. He is not on antibiotics and is not having any fevers. He denies any right upper quadrant pain.  Past Medical History:  Diagnosis Date  . Bacteremia due to Klebsiella pneumoniae   . Cholecystitis 04/2013  . Diabetes mellitus without complication (HCC)   . Elevated LFTs   . Goiter    Dr. Andrena Mews IM-Negative workup including biopsy of thyroid which was normal; brother has Graves and mother has thyroid disease  . History of kidney stones   . Hypertension   . Pancreatitis   . Pleural effusion   . Pneumonia   . Septic shock [facility])     Past Surgical History:  Procedure Laterality Date  . CHOLECYSTECTOMY N/A 05/17/2013   Procedure: LAPAROSCOPIC CHOLECYSTECTOMY WITH INTRAOPERATIVE CHOLANGIOGRAM;  Surgeon: Cherylynn Ridges, MD;  Location: [facility] OR;  Service: General;  Laterality: N/A;  . ERCP N/A 05/10/2013   Procedure: ENDOSCOPIC RETROGRADE CHOLANGIOPANCREATOGRAPHY (ERCP);  Surgeon: Theda Belfast, MD;  Location: [facility] ENDOSCOPY;  Service: Endoscopy;  Laterality: N/A;  . ERCP N/A 11/18/2016   Procedure: ENDOSCOPIC RETROGRADE CHOLANGIOPANCREATOGRAPHY (ERCP);  Surgeon: Hilarie Fredrickson, MD;  Location: [facility] ENDOSCOPY;  Service: Endoscopy;  Laterality: N/A;  . hepatic drain  11/13/2016   for hepatic abcess  . IR CATHETER TUBE CHANGE  11/27/2016  . LAPAROSCOPIC CHOLECYSTECTOMY  05/17/13   Dr. Lindie Spruce    Allergies: Albumin (human) and Buscopan [scopolamine]  Medications: Prior to  Admission medications   Medication Sig Start Date End Date Taking? Authorizing Provider  cholecalciferol (VITAMIN D) 1000 units tablet Take 1,000 Units by mouth daily.     [provider]  ciprofloxacin (CIPRO) 750 MG tablet Take 1 tablet (750 mg total) by mouth 2 (two) times daily. 11/26/16   Pyrtle, Carie Caddy, MD  glimepiride (AMARYL) 4 MG tablet TAKE 2 TABLETS BY MOUTH IN THE MORNING 09/30/16   Margaree Mackintosh, MD  glucose blood (BAYER CONTOUR NEXT TEST) test strip Use as instructed to check blood sugar once daily Dx code E11.9 02/14/16   Reather Littler, MD  lisinopril (PRINIVIL,ZESTRIL) 5 MG tablet TAKE 1 TABLET BY MOUTH IN THE MORNING 12/05/16   Margaree Mackintosh, MD  metFORMIN (GLUCOPHAGE) 1000 MG tablet Take 1 tablet by mouth two  times daily with meals 02/08/16   Margaree Mackintosh, MD  metroNIDAZOLE (FLAGYL) 500 MG tablet Take 1 tablet (500 mg total) by mouth 3 (three) times daily. 11/26/16   Pyrtle, Carie Caddy, MD  Multiple Vitamin (MULTIVITAMIN WITH MINERALS) TABS tablet Take 1 tablet by mouth daily.    [provider]  Omega-3 Fatty Acids (FISH OIL) 1000 MG CAPS Take 1 capsule by mouth daily.     [provider]  pioglitazone (ACTOS) 30 MG tablet Take 1 tablet (30 mg total) by mouth daily. 10/05/16   Margaree Mackintosh, MD     Family History  Problem Relation Age of Onset  .  Thyroid disease Mother        had thyroidectomy  . Thyroid disease Father        Has low thyroid in 2014  . Hypertension Father   . Thyroid disease Brother        Had radioactive iodine treatment  . Diabetes Paternal Uncle   . Colon cancer Neg Hx   . Esophageal cancer Neg Hx   . Stomach cancer Neg Hx     Social History   Social History  . Marital status: Married    Spouse name: N/A  . Number of children: 2  . Years of education: N/A   Occupational History  . hotel manager    Social History Main Topics  . Smoking status: Never Smoker  . Smokeless tobacco: Never Used  . Alcohol use No  . Drug use:  No  . Sexual activity: Not on file   Other Topics Concern  . Not on file   Social History Narrative  . No narrative on file      Review of Systems: A 12 point ROS discussed and pertinent positives are indicated in the HPI above.  All other systems are negative.  Review of Systems  Vital Signs: BP 113/83   Pulse (!) 108   Temp 98.8 F (37.1 C) (Oral)   Resp 15   Physical Exam  Constitutional: He is oriented to person, place, and time. He appears well-developed and well-nourished.  Abdominal: Soft.  His abdomen is nontender. The right upper quadrant drain insertion site is clean and dry. There is clear yellow fluid within the drain tubing.  Neurological: He is alert and oriented to person, place, and time.    Imaging: Dg Chest 2 View  Result Date: 11/23/2016 CLINICAL DATA:  Onset of right pleural effusion 3 weeks ago. Assess for resolution. EXAM: CHEST  2 VIEW COMPARISON:  Chest x-ray of May 20, 2013 FINDINGS: The lungs are well-expanded. No definite pleural effusion is observed. The small caliber right chest tube is located in the posterior basilar aspect of the right pleural space. There is no pneumothorax. The left lung is clear. The heart and mediastinal structures are normal. The bony thorax is unremarkable. IMPRESSION: No right-sided pleural effusion is observed. The small caliber chest tube is in reasonable position. Electronically Signed   By: David  [place] M.D.   On: 11/23/2016 11:37   Ct Abdomen Pelvis W Contrast  Result Date: 11/26/2016 CLINICAL DATA:  Follow-up liver abscess with a percutaneous drain. Percutaneous drain was placed with ultrasound guidance on 11/13/2016. Recent ERCP with sphincterotomy and common duct stone extraction. History of biliary pancreatitis. EXAM: CT ABDOMEN AND PELVIS WITH CONTRAST TECHNIQUE: Multidetector CT imaging of the abdomen and pelvis was performed using the standard protocol following bolus administration of intravenous contrast.  CONTRAST:  ISOVUE-300 IOPAMIDOL (ISOVUE-300) INJECTION 61% COMPARISON:  MRI 11/11/2016 FINDINGS: Lower chest: Atelectasis and question trace pleural fluid at the right lung base. Hepatobiliary: Poorly defined low-density structure at the posterior right hepatic dome compatible with the hepatic abscess. Small pockets of fluid and gas within the structure. A large portion of this structure probably represents edema rather than fluid. This area measures 4.2 x 3.8 cm on sequence 3, image 16 and previously measured 5.8 x 3.6 cm on the prior MRI. The residual fluid pocket measures up to 2.5 cm on the coronal images, sequence 601, and image 63. The drainage catheter is extending through the abscess but the pigtail is cephalad and medial to the  remaining fluid collection. There is a punctate low-density area in the anterior right hepatic lobe on sequence 2, image 24 which is less conspicuous or smaller compared to the previous MRI. This area is indeterminate. No other areas are concerning for a lesion or abscess collection. Main portal vein and intrahepatic portal veins are patent. Gallbladder has been removed. There is no significant biliary dilatation but the extrahepatic biliary system has wall thickened thickening and this is probably secondary to the recent ERCP and intervention. Biliary wall thickening is best seen on the coronal images. Pancreas: Patient no longer has a biliary stent. Mild fullness of the pancreatic head without acute inflammatory changes. Pseudocysts along the pancreatic tail have resolved. Spleen: Spleen is prominent for size and stable. Adrenals/Urinary Tract: Mild nodularity of the left adrenal gland is stable. Normal appearance of the right adrenal gland. 3 mm nonobstructive stone in left kidney interpolar region. No suspicious renal lesions. No hydronephrosis. Urinary bladder is unremarkable. Stomach/Bowel: No evidence for bowel dilatation or inflammation. Moderate amount of stool in the  colon. Normal appendix. Vascular/Lymphatic: Mild atherosclerotic disease in the aorta and iliac arteries. No aortic aneurysm. There is flow in the IMA, SMA and celiac trunk. 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 Large collateral vein extending along the anterior upper abdomen and draining back into the portal venous system. No significant lymph node enlargement in the abdomen or pelvis. Reproductive: Prostate is unremarkable. Other: No ascites.  No free air. 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 Calcification or ossification along the posterior disc at L4-L5. IMPRESSION: Hepatic abscess is decreasing in size. Residual fluid collection is just caudal and lateral to the existing pigtail drain. No new hepatic abscesses. Patient is being scheduled for a drain injection and possible drain repositioning. 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 Nonobstructive left kidney stone. Wall thickening in the extrahepatic biliary system probably secondary to recent ERCP and intervention. No evidence for acute pancreatic inflammation. Electronically Signed   By: Richarda OverlieAdam  Henn M.D.   On: 11/26/2016 10:52   Ir Catheter Tube Change  Result Date: 11/27/2016 INDICATION: 51 year old male with a history of hepatic abscess EXAM: IR CATHETER TUBE CHANGE MEDICATIONS: The patient is currently admitted to the hospital and receiving intravenous antibiotics. The antibiotics were administered within an appropriate time frame prior to the initiation of the procedure. ANESTHESIA/SEDATION: None COMPLICATIONS: None PROCEDURE: Informed written consent was obtained from the patient after a thorough discussion of the procedural risks, benefits and alternatives. All questions were addressed.  Maximal Sterile Barrier Technique was utilized including caps, mask, sterile gowns, sterile gloves, sterile drape, hand hygiene and skin antiseptic. A timeout was performed prior to the initiation of the procedure. Patient positioned supine position on the fluoroscopy table. The right upper quadrant was prepped and draped in the usual sterile fashion including the indwelling drain. Scout images were acquired. Small injection was performed through the indwelling drain. The drain was then ligated and removed over a guidewire. Short [place] the catheter was then placed over the guidewire into the abscess cavity for attempted wire placement. The hepatic tissue was dense in the region, and the wire would not formed in the largest component of the abscess. A new 10 French drain was positioned near the largest component of the pathic abscess and a final  image was stored. Catheter was attached to bulb suction. Patient tolerated the procedure well and remained hemodynamically stable throughout. No complications were encountered and no significant blood loss. IMPRESSION: Status post exchange of 10 French drainage catheter into hepatic abscess. Attached to bulb suction. Signed, Yvone Neu. Loreta Ave, DO [facility] Electronically Signed   By: Gilmer Mor D.O.   On: 11/27/2016 12:13   Mr 3d Recon At Scanner  Result Date: 11/12/2016 CLINICAL DATA:  Sepsis Sir or a cyst NG EXAM: MRI ABDOMEN WITHOUT AND WITH CONTRAST (INCLUDING MRCP) TECHNIQUE: Multiplanar multisequence MR imaging of the abdomen was performed both before and after the administration of intravenous contrast. Heavily T2-weighted images of the biliary and pancreatic ducts were obtained, and three-dimensional MRCP images were rendered by post processing. CONTRAST:  14mL MULTIHANCE GADOBENATE DIMEGLUMINE 529 MG/ML IV SOLN COMPARISON:  CT abdomen 08/16/2013 FINDINGS: Lower chest:  Lung bases are clear.  Hepatobiliary: Ovoid lesion in the posterior RIGHT hepatic lobe measures 5.8 x 3.6 cm (image 9, series 8). This lesion is hyperintense on T2 weighted imaging and demonstrates restricted diffusion (series 4 and series 8). Lesion is hypointense on the precontrast T1 weighted imaging. Postcontrast imaging demonstrates peripheral enhancement as well as enhancing internal thickened septations (image 24, series 1502). Second lesion which is hyperintense on T2 weighted imaging (image 15, series 8) which also demonstrates peripheral enhancement measuring 10 mm (image 35, series 1503) within segment 8 of the RIGHT hepatic lobe. There is no intrahepatic duct dilatation. MRCP imaging demonstrates a linear LEFT filling defect within the common bile duct measuring approximately 4 cm (image 26, series 7)). This is difficult to define and best seen on the FIESTA series (series 13, image 17). Common bile duct is mildly dilated at 8 mm. Pancreas: There is fullness in the pancreatic head however no mass lesion identified. This pancreatic head fullness has equal signal intensity to the normal pancreatic parenchyma on all sequences (image 61, series 1503). Spleen: Normal spleen. The splenic vein is narrowed. Venous collaterals are present. Adrenals/urinary tract: Adrenal glands and kidneys are normal. Stomach/Bowel: Stomach and limited of the small bowel is unremarkable Vascular/Lymphatic: Abdominal aortic normal caliber. No retroperitoneal periportal lymphadenopathy. Musculoskeletal: No aggressive osseous lesion IMPRESSION: 1. Ovoid mass in the RIGHT hepatic lobe with enhancing septations is most suggestive of HEPATIC ABSCESS. A second smaller lesion in the RIGHT hepatic lobe is also favored abscess. Less favored differential would include a biliary cystadenoma/cystadenocarcinoma or metastasis. 2. No intrahepatic biliary duct dilatation. Common bile duct minimally dilated to 8 mm. Linear filling defect within the common bile duct is  poorly characterized by MRI but favored to represent the reported retained biliary stent. 3. Fullness within the pancreatic head favored to represent normal variation. These results will be called to the ordering clinician or representative by the Radiologist Assistant, and communication documented in the PACS or zVision Dashboard. Electronically Signed   By: Genevive Bi M.D.   On: 11/12/2016 08:22   Dg Ercp Biliary & Pancreatic Ducts  Result Date: 11/18/2016 CLINICAL DATA:  51 year old male undergoing ERCP EXAM: ERCP TECHNIQUE: Multiple spot images obtained with the fluoroscopic device and submitted for interpretation post-procedure. FLUOROSCOPY TIME:  Fluoroscopy Time:  5 minutes 30 seconds reported COMPARISON:  abdominal ultrasound 11/13/2016 FINDINGS: A total of 12 intraoperative saved images demonstrate a flexible endoscope in the descending duodenum with wire cannulation of the common bile duct. Subsequent cholangiogram demonstrates mild common duct dilatation. Filling defects in the mid duct may  represent choledocholithiasis. Subsequent images demonstrate sphincterotomy and balloon sweep of the common duct. IMPRESSION: ERCP with sphincterotomy and balloon sweep of the common duct as described above. These images were submitted for radiologic interpretation only. Please see the procedural report for the amount of contrast and the fluoroscopy time utilized. Electronically Signed   By: Malachy Moan M.D.   On: 11/18/2016 12:26   Mr Abdomen Mrcp Vivien Rossetti Contast  Result Date: 11/12/2016 CLINICAL DATA:  Sepsis Sir or a cyst NG EXAM: MRI ABDOMEN WITHOUT AND WITH CONTRAST (INCLUDING MRCP) TECHNIQUE: Multiplanar multisequence MR imaging of the abdomen was performed both before and after the administration of intravenous contrast. Heavily T2-weighted images of the biliary and pancreatic ducts were obtained, and three-dimensional MRCP images were rendered by post processing. CONTRAST:  14mL MULTIHANCE  GADOBENATE DIMEGLUMINE 529 MG/ML IV SOLN COMPARISON:  CT abdomen 08/16/2013 FINDINGS: Lower chest:  Lung bases are clear. Hepatobiliary: Ovoid lesion in the posterior RIGHT hepatic lobe measures 5.8 x 3.6 cm (image 9, series 8). This lesion is hyperintense on T2 weighted imaging and demonstrates restricted diffusion (series 4 and series 8). Lesion is hypointense on the precontrast T1 weighted imaging. Postcontrast imaging demonstrates peripheral enhancement as well as enhancing internal thickened septations (image 24, series 1502). Second lesion which is hyperintense on T2 weighted imaging (image 15, series 8) which also demonstrates peripheral enhancement measuring 10 mm (image 35, series 1503) within segment 8 of the RIGHT hepatic lobe. There is no intrahepatic duct dilatation. MRCP imaging demonstrates a linear LEFT filling defect within the common bile duct measuring approximately 4 cm (image 26, series 7)). This is difficult to define and best seen on the FIESTA series (series 13, image 17). Common bile duct is mildly dilated at 8 mm. Pancreas: There is fullness in the pancreatic head however no mass lesion identified. This pancreatic head fullness has equal signal intensity to the normal pancreatic parenchyma on all sequences (image 61, series 1503). Spleen: Normal spleen. The splenic vein is narrowed. Venous collaterals are present. Adrenals/urinary tract: Adrenal glands and kidneys are normal. Stomach/Bowel: Stomach and limited of the small bowel is unremarkable Vascular/Lymphatic: Abdominal aortic normal caliber. No retroperitoneal periportal lymphadenopathy. Musculoskeletal: No aggressive osseous lesion IMPRESSION: 1. Ovoid mass in the RIGHT hepatic lobe with enhancing septations is most suggestive of HEPATIC ABSCESS. A second smaller lesion in the RIGHT hepatic lobe is also favored abscess. Less favored differential would include a biliary cystadenoma/cystadenocarcinoma or metastasis. 2. No intrahepatic  biliary duct dilatation. Common bile duct minimally dilated to 8 mm. Linear filling defect within the common bile duct is poorly characterized by MRI but favored to represent the reported retained biliary stent. 3. Fullness within the pancreatic head favored to represent normal variation. These results will be called to the ordering clinician or representative by the Radiologist Assistant, and communication documented in the PACS or zVision Dashboard. Electronically Signed   By: Genevive Bi M.D.   On: 11/12/2016 08:22   [place] Image Guided Drainage Percut Cath  Peritoneal Retroperit  Result Date: 11/13/2016 INDICATION: Posterior right hepatic dome hepatic abscess by imaging. EXAM: ULTRASOUND RIGHT HEPATIC ABSCESS DRAIN INSERTION MEDICATIONS: The patient is currently on oral antibiotics. ANESTHESIA/SEDATION: Fentanyl 100 mcg IV; Versed 1.0 mg IV Moderate Sedation Time:  18 minutes The patient was continuously monitored during the procedure by the interventional radiology nurse under my direct supervision. COMPLICATIONS: None immediate. PROCEDURE: Informed written consent was obtained from the patient after a thorough discussion of the procedural risks, benefits and alternatives.  All questions were addressed. Maximal Sterile Barrier Technique was utilized including caps, mask, sterile gowns, sterile gloves, sterile drape, hand hygiene and skin antiseptic. A timeout was performed prior to the initiation of the procedure. Previous imaging reviewed. Patient positioned right anterior oblique. Ultrasound localization performed. The complex hepatic fluid collection in the right hepatic dome was localized. Lower intercostal space marked. Under sterile conditions and local anesthesia, a 17 gauge 11.8 cm access needle was advanced percutaneously from a transhepatic approach into the hepatic abscess by ultrasound. Needle position confirmed. Guidewire advanced. Tract dilatation performed to insert a 10 [place] drain. Drain  catheter position confirmed with ultrasound. Images obtained for documentation. Syringe aspiration yielded 20 cc bloody exudative fluid. Sample sent for Gram stain and culture. Catheter secured with a Prolene suture and connected to external suction bulb. Patient tolerated the procedure well. No complication. IMPRESSION: Successful ultrasound right hepatic dome abscess drain insertion. Electronically Signed   By: Judie Petit.  Shick M.D.   On: 11/13/2016 10:07    Labs:  CBC:  Recent Labs  11/11/16 0920 11/13/16 0729 11/23/16 1225 11/24/16 1100  WBC 9.9 11.2* 10.5 9.4  HGB 12.7* 11.9* 12.4* 12.4*  HCT 37.1* 35.9* 37.6* 37.3*  PLT 188.0 205 324 316    COAGS:  Recent Labs  11/11/16 0920 11/13/16 0729  INR 1.1* 0.88  APTT  --  33    BMP:  Recent Labs  11/23/16 1225 11/24/16 1100 11/24/16 1439 11/26/16 1039 11/27/16 1042 12/01/16 1225  NA 135 135 136 136 139 137  K 4.4 4.7 4.3 3.8 4.5 4.7  CL 100 100* 105 100 104 101  CO2 25 24 23 26 25 21   GLUCOSE 285* 211* 162* 127* 121* 223*  BUN 17 24* 21* 15 16 13   CALCIUM 9.0 9.6 8.1* 9.5 9.0 9.2  CREATININE 1.32 1.98* 1.76* 1.68* 1.63* 1.70*  GFRNONAA 63 38* 44*  --   --   --   GFRAA 73 44* 51*  --   --   --     LIVER FUNCTION TESTS:  Recent Labs  11/11/16 0920 11/23/16 1225 11/24/16 1100 11/26/16 1039  BILITOT 1.0 0.8 1.1 0.7  AST 21 15 21 15   ALT 50 12 16* 10  ALKPHOS 197* 124* 124 100  PROT 6.3 6.6 7.4 7.2  ALBUMIN 3.6 3.8 3.6 4.0    TUMOR MARKERS: No results for input(s): AFPTM, CEA, CA199, CHROMGRNA in the last 8760 hours.  Assessment and Plan:  The hepatic abscess has completely resolved and he is asymptomatic. The drain was removed. He is not on any antibiotics at this time. He was instructed to contact our office or his gastroenterologist if fevers recur.   Electronically Signed: Mohogany Toppins, ART A 12/09/2016, 10:28 AM   I spent a total of   15 Minutes in face to face in clinical consultation, greater than 50%  of which was counseling/coordinating care for hepatic abscess.

## 2016-12-18 ENCOUNTER — Ambulatory Visit: Payer: 59 | Admitting: Internal Medicine

## 2016-12-19 NOTE — Progress Notes (Signed)
   Subjective:    Patient ID: Nicolas Chandler, male    DOB: 04/19/1966, 51 y.o.   MRN: [id number]  HPI He was sent to the emergency department July 3 for volume depletion . He had a hepatic dome abscess drain in place per radiology. Had been hospitalized acutely in [place] with septicemia and DKA on June 13 and was discharged June 17.. Was found to have a retained stent and a stone and underwent ERCP by Dr. With sphincterotomy June 27. Glucose at the time was 211 with a creatinine of 1.98 and had previously been 1.32 on July 2 Potassium was normal. Sodium was normal. Hemoglobin was stable at 12.4 g and his white blood cell count was 9400. He had slight lactic acidosis of 1.92. Repeat chest x-ray on July 2 showed no evidence of pleural effusion which he had had previously during his hospitalization.  He is feeling better. Serum creatinine repeated today and is 1.70. He is starting to eat more. He's trying to stay hydrated.  He is accompanied by his wife. He has appointment July 18 to follow-up in interventional radiology regarding drain for the hepatic abscess.    Review of Systems see above     Objective:   Physical Exam Skin warm and dry. Nodes none. Patient says his Accu-Cheks are stable. Chest clear. Cardiac exam regular rate and rhythm normal S1 and S2       Assessment & Plan:  Status post emergency department treatment with IV fluids for elevated serum creatinine due to nausea and vomiting. Patient needs stay well hydrated. He has follow-up appointment in interventional radiology July 18. I will see him again mid-August. He knows he can call if he is not doing well.

## 2016-12-19 NOTE — Patient Instructions (Signed)
Stay well hydrated. Call if symptoms recur regarding nausea and vomiting. Continue to monitor your glucose carefully.

## 2016-12-23 ENCOUNTER — Other Ambulatory Visit: Payer: 59

## 2017-01-04 ENCOUNTER — Ambulatory Visit (INDEPENDENT_AMBULATORY_CARE_PROVIDER_SITE_OTHER): Payer: 59 | Admitting: Internal Medicine

## 2017-01-04 ENCOUNTER — Encounter: Payer: Self-pay | Admitting: Internal Medicine

## 2017-01-04 VITALS — BP 98/68 | HR 97 | Temp 97.8°F | Wt 135.0 lb

## 2017-01-04 DIAGNOSIS — R7989 Other specified abnormal findings of blood chemistry: Secondary | ICD-10-CM | POA: Diagnosis not present

## 2017-01-04 DIAGNOSIS — E119 Type 2 diabetes mellitus without complications: Secondary | ICD-10-CM

## 2017-01-04 DIAGNOSIS — E875 Hyperkalemia: Secondary | ICD-10-CM

## 2017-01-04 LAB — BASIC METABOLIC PANEL
BUN: 19 mg/dL (ref 7–25)
CALCIUM: 9.7 mg/dL (ref 8.6–10.3)
CO2: 24 mmol/L (ref 20–32)
Chloride: 106 mmol/L (ref 98–110)
Creat: 1.62 mg/dL — ABNORMAL HIGH (ref 0.60–1.35)
GLUCOSE: 170 mg/dL — AB (ref 65–99)
POTASSIUM: 5.5 mmol/L — AB (ref 3.5–5.3)
SODIUM: 137 mmol/L (ref 135–146)

## 2017-01-05 ENCOUNTER — Other Ambulatory Visit: Payer: 59 | Admitting: Internal Medicine

## 2017-01-05 LAB — HEMOGLOBIN A1C
HEMOGLOBIN A1C: 7.6 % — AB (ref <5.7)
MEAN PLASMA GLUCOSE: 171 mg/dL

## 2017-01-07 ENCOUNTER — Other Ambulatory Visit: Payer: 59 | Admitting: Internal Medicine

## 2017-01-08 ENCOUNTER — Other Ambulatory Visit: Payer: 59 | Admitting: Internal Medicine

## 2017-01-08 DIAGNOSIS — E875 Hyperkalemia: Secondary | ICD-10-CM

## 2017-01-09 LAB — POTASSIUM: Potassium: 5.8 mmol/L — ABNORMAL HIGH (ref 3.5–5.3)

## 2017-01-10 NOTE — Progress Notes (Signed)
   Subjective:    Patient ID: Nicolas Chandler, male    DOB: [birth year], 51 y.o.   MRN: [id number]  HPI In today for follow-up on type 2 diabetes mellitus elevated serum creatinine that occurred after admission for sepsis in [place] related to retained bile duct stone and stent.  He is feeling better and is slowly getting his energy back.  His creatinine has been elevated since his admission. His creatinine was 1.05 in April. He was seen here on June 18 for follow-up from hospitalization in [place] where he had been discharged the previous day and his creatinine was 1.30 but he had ridden in a car overnight. His creatinine was rechecked 2 days later and was 1.09.  On July 2 his creatinine was 1.32 and on July 3 was 1.98 On July 6 creatinine was 1.63, on June 10 creatinine was 1.70 and on August 13, creatinine is 1.62. Obviously had acute kidney injury during this illness.  His be met showed a potassium of 4.7 on July 10 and was 5.5 on August 13. We repeated his potassium on August 17 and result was 5.8. It says the specimen is not hemolyzed but it is collected here and rides around and of all toe for several hours before it gets to lab.  He is not on any potassium supplement.  He is on 5 mg of lisinopril.  When he was seen this morning we did not have results of his serum potassium   Review of Systems says he is getting his energy back slowly and feels pre-well.  Drain from his chest wall has been removed     Objective:   Physical Exam  Neck is supple. No adenopathy. Chest clear. Cardiac exam regular rate and rhythm.      Assessment & Plan:  Type 2 diabetes mellitus  Acute kidney injury with elevated serum creatinine and now with elevated serum potassium on ARB  Plan: I have contacted him and he will return on Monday for office visit and basic metabolic panel. He is to stay well hydrated over the weekend. He likely will need to see nephrologist. We will probably stop ARB if potassium  remains elevated. It will be obtained stat on Monday  25 minutes spent with this issue including notifying patient and reviewing chart as well as seeing patient during the office visit

## 2017-01-10 NOTE — Patient Instructions (Addendum)
Return on Monday, August 20 for follow-up. May need referral to [facility]. Stat lab work will be obtained on Monday

## 2017-01-11 ENCOUNTER — Encounter: Payer: Self-pay | Admitting: Internal Medicine

## 2017-01-11 ENCOUNTER — Ambulatory Visit (INDEPENDENT_AMBULATORY_CARE_PROVIDER_SITE_OTHER): Payer: 59 | Admitting: Internal Medicine

## 2017-01-11 VITALS — BP 100/80 | HR 81 | Temp 98.0°F | Ht 66.0 in | Wt 137.0 lb

## 2017-01-11 DIAGNOSIS — R7989 Other specified abnormal findings of blood chemistry: Secondary | ICD-10-CM

## 2017-01-11 DIAGNOSIS — E875 Hyperkalemia: Secondary | ICD-10-CM

## 2017-01-11 LAB — BASIC METABOLIC PANEL
BUN: 17 mg/dL (ref 7–25)
CALCIUM: 9.6 mg/dL (ref 8.6–10.3)
CO2: 29 mmol/L (ref 20–32)
CREATININE: 1.53 mg/dL — AB (ref 0.60–1.35)
Chloride: 107 mmol/L (ref 98–110)
GLUCOSE: 162 mg/dL — AB (ref 65–99)
Potassium: 5.5 mmol/L — ABNORMAL HIGH (ref 3.5–5.3)
Sodium: 142 mmol/L (ref 135–146)

## 2017-01-11 NOTE — Patient Instructions (Signed)
Stat lab drawn and pending with further instructions to follow

## 2017-01-11 NOTE — Progress Notes (Addendum)
   Subjective:    Patient ID: Nicolas Chandler, male    DOB: Mar 31, 1966, 50 y.o.   MRN: [id number]  HPI In today discuss hyperkalemia. He remains on low-dose losartan for diabetes but is only taking one half of a 5 mg tablet daily.  I do not know if the recent hyperkalemia is due to lab error or renal insufficiency. He's been taking multivitamin containing potassium. His creatinine has increased since his hospitalization in [place] with septicemia in DKA. He may have an element of chronic kidney disease at this point after acute kidney injury. It will be external to see if stopping losartan will help this.   Review of Systems see above     Objective:   Physical Exam  Not examined. Spent 15 minutes speaking with him about these issues. Stat  B-met met drawn today      Assessment & Plan:  Hyperkalemia-? Due to oral intake, ARB, or chronic kidney disease  Plan: Stat basic metabolic panel drawn and results pending with further instructions to follow

## 2017-01-18 ENCOUNTER — Other Ambulatory Visit: Payer: 59 | Admitting: Internal Medicine

## 2017-01-18 DIAGNOSIS — R7989 Other specified abnormal findings of blood chemistry: Secondary | ICD-10-CM

## 2017-01-18 LAB — BASIC METABOLIC PANEL
BUN: 17 mg/dL (ref 7–25)
CO2: 25 mmol/L (ref 20–32)
CREATININE: 1.37 mg/dL — AB (ref 0.60–1.35)
Calcium: 9.3 mg/dL (ref 8.6–10.3)
Chloride: 106 mmol/L (ref 98–110)
GLUCOSE: 135 mg/dL — AB (ref 65–99)
POTASSIUM: 5.1 mmol/L (ref 3.5–5.3)
Sodium: 140 mmol/L (ref 135–146)

## 2017-01-18 NOTE — Progress Notes (Signed)
Lab drawn.

## 2017-01-27 ENCOUNTER — Ambulatory Visit: Payer: 59 | Admitting: Internal Medicine

## 2017-01-27 ENCOUNTER — Telehealth: Payer: Self-pay | Admitting: Internal Medicine

## 2017-01-27 NOTE — Telephone Encounter (Signed)
Noted  

## 2017-02-15 ENCOUNTER — Encounter: Payer: Self-pay | Admitting: Internal Medicine

## 2017-02-15 ENCOUNTER — Ambulatory Visit (INDEPENDENT_AMBULATORY_CARE_PROVIDER_SITE_OTHER): Payer: 59 | Admitting: Internal Medicine

## 2017-02-15 VITALS — BP 110/80 | HR 80 | Temp 97.5°F | Wt 143.0 lb

## 2017-02-15 DIAGNOSIS — E119 Type 2 diabetes mellitus without complications: Secondary | ICD-10-CM

## 2017-02-15 DIAGNOSIS — E785 Hyperlipidemia, unspecified: Secondary | ICD-10-CM | POA: Diagnosis not present

## 2017-02-15 DIAGNOSIS — R7989 Other specified abnormal findings of blood chemistry: Secondary | ICD-10-CM

## 2017-02-15 NOTE — Patient Instructions (Addendum)
Lab work to be reviewed tomorrow and further instructions to follow. Last physical exam was April 2018.

## 2017-02-15 NOTE — Progress Notes (Addendum)
   Subjective:    Patient ID: Nicolas Chandler, male    DOB: April 23, 1966, 51 y.o.   MRN: [id number]  HPI  Pt  Is being monitored for elevated serum creatinine.This was noted to be an issue after he was hospitalized in [facility] June 13 with gram-negative sepsis resulting in DKA and septic shock.  He returned to the office on June 18 after being discharged in [place] on June 17 and driving home. When he was seen on June 18, his potassium was 4.4 and his creatinine was 1.30. On July 2 creatinine was 1.32 and potassium was 4.4.  On June 27 he underwent ERCP and sphincterotomy for retained biliary stent which was placed in 2014 for biliary obstruction and pancreatitis. In December 2014 he also underwent laparoscopic cholecystectomy.  On July 3, he came to the office and it was noted he was hypotensive with blood pressure 86/60 and pulse 112. He was sent to the emergency department where he was diagnosed with dehydration and was given 2 L of IV fluids. At that time his creatinine was 1.76 on discharge and had been 1.98 on the same day before being treated with IV fluids. We followed up with him on July 5 and creatinine was 1.68, creatinine was 1.63 on July 6, creatinine was 1.70 on July 10 and subsequently improved to 1.62 by August 13. However on August 17 he had a potassium of 5.8. Losartan was discontinued and by August 20 potassium had returned 5.5 with a creatinine of 1.53  Basic metabolic panel on August 27 showed creatinine of 1.37 and potassium of 5.1.  Basic metabolic panel drawn today and pending along with hemoglobin A1c and lipid panel.    Review of Systems he has no new complaints  Declines flu vaccine.     Objective:   Physical Exam Neck is supple without JVD thyromegaly or carotid bruits. Chest clear. Cardiac exam regular rate and rhythm. Extremities without edema. Diabetic foot exam is normal       Assessment & Plan:  It seems that he suffered insult to his kidneys from  hypotension and volume depletion after his discharge from hospital in [place]. His creatinine on the day of discharge was 1.01. When I saw him the following day creatinine was 1.30 and he had had a long car ride home. His creatinine was not checked between June 20 at which time it was normal at 1.09 and July 2 at which time it was 1.32. He had ERCP, stent removal and sphincterotomy on June 27. This is slowly improved over the past several months and was last checked August 27 and was 1.37 basic metabolic panel from today is pending. We will continue to assess this. He remains off losartan. This is because it can cause elevated creatinine and potassium.  He looks well. Says he is watching his diet. Hemoglobin A1c is pending. Says his glucose readings have been around 130.  We will advise follow-up after reviewing his lab work tomorrow. He declines flu vaccine.  Persistent elevation of serum creatinine. Order renal ultrasound and refer to [facility]. Renal duplex study to be done at [facility]

## 2017-02-16 LAB — BASIC METABOLIC PANEL WITH GFR
BUN/Creatinine Ratio: 16 (calc) (ref 6–22)
BUN: 23 mg/dL (ref 7–25)
CALCIUM: 9.8 mg/dL (ref 8.6–10.3)
CHLORIDE: 103 mmol/L (ref 98–110)
CO2: 27 mmol/L (ref 20–32)
Creat: 1.48 mg/dL — ABNORMAL HIGH (ref 0.60–1.35)
GFR, EST AFRICAN AMERICAN: 63 mL/min/{1.73_m2} (ref >=60)
GFR, EST NON AFRICAN AMERICAN: 55 mL/min/{1.73_m2} — AB (ref >=60)
Glucose, Bld: 146 mg/dL — ABNORMAL HIGH (ref 65–99)
Potassium: 5 mmol/L (ref 3.5–5.3)
Sodium: 137 mmol/L (ref 135–146)

## 2017-02-16 LAB — LIPID PANEL
Cholesterol: 251 mg/dL — ABNORMAL HIGH (ref <200)
HDL: 56 mg/dL (ref >40)
LDL CHOLESTEROL (CALC): 169 mg/dL — AB
Non-HDL Cholesterol (Calc): 195 mg/dL (calc) — ABNORMAL HIGH (ref <130)
TRIGLYCERIDES: 125 mg/dL (ref <150)
Total CHOL/HDL Ratio: 4.5 (calc) (ref <5.0)

## 2017-02-16 LAB — HEMOGLOBIN A1C
Hgb A1c MFr Bld: 7 % of total Hgb — ABNORMAL HIGH (ref <5.7)
Mean Plasma Glucose: 154 (calc)
eAG (mmol/L): 8.5 (calc)

## 2017-02-16 LAB — MICROALBUMIN / CREATININE URINE RATIO
CREATININE, URINE: 97 mg/dL (ref 20–320)
MICROALB UR: 0.7 mg/dL
MICROALB/CREAT RATIO: 7 ug/mg{creat} (ref <30)

## 2017-02-17 ENCOUNTER — Telehealth: Payer: Self-pay

## 2017-02-17 DIAGNOSIS — R7989 Other specified abnormal findings of blood chemistry: Secondary | ICD-10-CM

## 2017-02-17 NOTE — Addendum Note (Signed)
Addended by: Margaree Mackintosh on: 02/17/2017 10:12 AM   Modules accepted: Orders

## 2017-02-17 NOTE — Telephone Encounter (Signed)
-----   Message from Margaree Mackintosh, MD sent at 02/17/2017 10:07 AM EDT ----- Creatinine remains elevated. Refer to [facility]. Needs renal ultrasound ordered in [facility]. Please tell pt when to go for this test.

## 2017-02-17 NOTE — Telephone Encounter (Signed)
Renal US ordered.

## 2017-02-18 ENCOUNTER — Ambulatory Visit
Admission: RE | Admit: 2017-02-18 | Discharge: 2017-02-18 | Disposition: A | Discharge status: Home / Self Care | Payer: 59 | Source: Ambulatory Visit | Attending: Internal Medicine | Admitting: Internal Medicine

## 2017-02-18 DIAGNOSIS — R7989 Other specified abnormal findings of blood chemistry: Secondary | ICD-10-CM

## 2017-03-08 ENCOUNTER — Encounter: Payer: Self-pay | Admitting: Diagnostic Radiology

## 2017-03-15 ENCOUNTER — Other Ambulatory Visit: Payer: 59 | Admitting: Internal Medicine

## 2017-03-16 ENCOUNTER — Ambulatory Visit: Payer: 59 | Admitting: Internal Medicine

## 2017-04-05 ENCOUNTER — Ambulatory Visit: Payer: Self-pay | Admitting: Internal Medicine

## 2017-06-02 ENCOUNTER — Ambulatory Visit: Payer: BLUE CROSS/BLUE SHIELD | Admitting: Internal Medicine

## 2017-06-02 ENCOUNTER — Encounter: Payer: Self-pay | Admitting: Internal Medicine

## 2017-06-02 ENCOUNTER — Other Ambulatory Visit (INDEPENDENT_AMBULATORY_CARE_PROVIDER_SITE_OTHER): Payer: BLUE CROSS/BLUE SHIELD

## 2017-06-02 VITALS — BP 110/78 | HR 88 | Ht 67.25 in | Wt 147.4 lb

## 2017-06-02 DIAGNOSIS — K750 Abscess of liver: Secondary | ICD-10-CM

## 2017-06-02 LAB — COMPREHENSIVE METABOLIC PANEL
ALT: 11 U/L (ref 0–53)
AST: 13 U/L (ref 0–37)
Albumin: 4.6 g/dL (ref 3.5–5.2)
Alkaline Phosphatase: 63 U/L (ref 39–117)
BUN: 19 mg/dL (ref 6–23)
CHLORIDE: 102 meq/L (ref 96–112)
CO2: 29 meq/L (ref 19–32)
Calcium: 10 mg/dL (ref 8.4–10.5)
Creatinine, Ser: 1.51 mg/dL — ABNORMAL HIGH (ref 0.40–1.50)
GFR: 52.27 mL/min — AB (ref >60.00)
GLUCOSE: 131 mg/dL — AB (ref 70–99)
POTASSIUM: 5.7 meq/L — AB (ref 3.5–5.1)
Sodium: 138 mEq/L (ref 135–145)
Total Bilirubin: 1.1 mg/dL (ref 0.2–1.2)
Total Protein: 7.1 g/dL (ref 6.0–8.3)

## 2017-06-02 LAB — CBC WITH DIFFERENTIAL/PLATELET
BASOS PCT: 0.4 % (ref 0.0–3.0)
Basophils Absolute: 0 10*3/uL (ref 0.0–0.1)
EOS PCT: 2.7 % (ref 0.0–5.0)
Eosinophils Absolute: 0.2 10*3/uL (ref 0.0–0.7)
HCT: 42.2 % (ref 39.0–52.0)
HEMOGLOBIN: 14.5 g/dL (ref 13.0–17.0)
LYMPHS ABS: 3 10*3/uL (ref 0.7–4.0)
Lymphocytes Relative: 36.9 % (ref 12.0–46.0)
MCHC: 34.2 g/dL (ref 30.0–36.0)
MCV: 93.7 fl (ref 78.0–100.0)
MONOS PCT: 6.1 % (ref 3.0–12.0)
Monocytes Absolute: 0.5 10*3/uL (ref 0.1–1.0)
Neutro Abs: 4.4 10*3/uL (ref 1.4–7.7)
Neutrophils Relative %: 53.9 % (ref 43.0–77.0)
Platelets: 183 10*3/uL (ref 150.0–400.0)
RBC: 4.51 Mil/uL (ref 4.22–5.81)
RDW: 14.3 % (ref 11.5–15.5)
WBC: 8.3 10*3/uL (ref 4.0–10.5)

## 2017-06-02 LAB — AMYLASE: AMYLASE: 59 U/L (ref 27–131)

## 2017-06-02 LAB — LIPASE: LIPASE: 49 U/L (ref 11.0–59.0)

## 2017-06-02 NOTE — Progress Notes (Signed)
Please check status of his Neprhology consult.

## 2017-06-02 NOTE — Progress Notes (Signed)
Subjective:    Patient ID: Nicolas Chandler, male    DOB: 03/12/1966, 52 y.o.   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  He reports that he has been doing well clinically.  He denies abdominal pain.  No nausea or vomiting.  Good appetite.  Normal bowel habits.  No blood in his stool or melena.  No jaundice or itching.  He was recently in [place] in December 2018 where he was told his pancreatic enzymes were elevated.  He reports having an endoscopy and colonoscopy performed while they are which were normal.  He denies having any polyps removed.  He was told that he may have fatty liver by ultrasound.  He also has had elevated creatinine and has been referred to nephrology.  His visit is on 06/16/2017.  Review of Systems As per HPI, otherwise negative  Current Medications, Allergies, Past Medical History, Past Surgical History, Family History and Social History were reviewed in [facility] electronic medical record.     Objective:   Physical Exam BP 110/78 (BP Location: Left Arm, Patient Position: Sitting, Cuff Size: Normal)   Pulse 88   Ht 5' 7.25" (1.708 m) Comment: height measured without shoes  Wt 147 lb 6 oz (66.8 kg)   BMI 22.91 kg/m  Constitutional: Well-developed and well-nourished. No distress. HEENT: Normocephalic and atraumatic. Oropharynx is clear and moist. Conjunctivae are normal.  No scleral icterus. Neck: Neck supple. Trachea midline. Cardiovascular: Normal rate, regular rhythm and intact distal pulses. No M/R/G Pulmonary/chest: Effort normal and breath sounds normal. No wheezing, rales or rhonchi. Abdominal: Soft, nontender, nondistended. Bowel sounds active throughout. There are no masses palpable. No hepatosplenomegaly. Extremities: no clubbing, cyanosis, or  edema Neurological: Alert and oriented to person place and time. Skin: Skin is warm and dry. Psychiatric: Normal mood and affect. Behavior is normal.  CBC    Component Value Date/Time   WBC 9.4 11/24/2016 1100   RBC 4.11 (L) 11/24/2016 1100   HGB 12.4 (L) 11/24/2016 1100   HCT 37.3 (L) 11/24/2016 1100   PLT 316 11/24/2016 1100   MCV 90.8 11/24/2016 1100   MCH 30.2 11/24/2016 1100   MCHC 33.2 11/24/2016 1100   RDW 13.5 11/24/2016 1100   LYMPHSABS 2.6 11/24/2016 1100   MONOABS 0.6 11/24/2016 1100   EOSABS 0.2 11/24/2016 1100   BASOSABS 0.0 11/24/2016 1100   CMP     Component Value Date/Time   NA 137 02/15/2017 1142   K 5.0 02/15/2017 1142   CL 103 02/15/2017 1142   CO2 27 02/15/2017 1142   GLUCOSE 146 (H) 02/15/2017 1142   BUN 23 02/15/2017 1142   CREATININE 1.48 (H) 02/15/2017 1142   CALCIUM 9.8 02/15/2017 1142   PROT 7.2 11/26/2016 1039   ALBUMIN 4.0 11/26/2016 1039   AST 15 11/26/2016 1039   ALT 10 11/26/2016 1039   ALKPHOS 100 11/26/2016 1039   BILITOT 0.7 11/26/2016 1039   GFRNONAA 55 (L) 02/15/2017 1142   GFRAA 63 02/15/2017 1142       Assessment & Plan:  52 year old male with a history of 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  1.  History of liver abscess/history of ascending cholangitis --he has recovered after antibiotics and  percutaneous drainage of his liver abscess.  His cholangitis was secondary to a retained biliary stent which has been removed.  He reportedly had possibly pancreatic enzyme test elevation when checked recently in [place].  Clinically he is well from a hepatobiliary standpoint. I recommended checking CBC, CMP amylase and lipase today I recommended MRI abdomen with and without contrast plus MRCP to confirm complete resolution of his liver abscess and to fully evaluate his bile ducts. If he can  obtain lab records from [place] that would be helpful  2.  CRC screening --patient reports normal colonoscopy in [place] in December 2018.  No family history of colon cancer, repeat would be in 10 years  3.  Elevated serum creatinine --nephrology appointment pending  25 minutes spent with the patient today. Greater than 50% was spent in counseling and coordination of care with the patient

## 2017-06-02 NOTE — Patient Instructions (Signed)
Your physician has requested that you go to the basement for the following lab work before leaving today: CBC,CMP, Amylase, Lipase  You have been scheduled for an MRI/MRCP at [facility] on Wednesday, 06/09/17. Your appointment time is 2:00 pm. Please arrive 15 minutes prior to your appointment time for registration purposes. Please make certain not to have anything to eat or drink 4 hours prior to your test. In addition, if you have any metal in your body, have a pacemaker or defibrillator, please be sure to let your ordering physician know. This test typically takes 45 minutes to 1 hour to complete. Should you need to reschedule, please call [phone] to do so.  If you are age 50 or older, your body mass index should be between 23-30. Your Body mass index is 22.91 kg/m. If this is out of the aforementioned range listed, please consider follow up with your Primary Care Provider.  If you are age 464 or younger, your body mass index should be between 19-25. Your Body mass index is 22.91 kg/m. If this is out of the aformentioned range listed, please consider follow up with your Primary Care Provider.

## 2017-06-03 ENCOUNTER — Telehealth: Payer: Self-pay | Admitting: Internal Medicine

## 2017-06-03 DIAGNOSIS — K750 Abscess of liver: Secondary | ICD-10-CM

## 2017-06-03 NOTE — Telephone Encounter (Signed)
Per [employer], patients authorization has already been changed to [facility] location. Called [facility] and they state patient is already scheduled for 06/09/17 at 130 pm with 1:00 pm arrival. Patient is already aware. Orders are faxed over to [facility].

## 2017-06-08 ENCOUNTER — Encounter: Payer: Self-pay | Admitting: Internal Medicine

## 2017-06-09 ENCOUNTER — Ambulatory Visit (HOSPITAL_COMMUNITY): Payer: BLUE CROSS/BLUE SHIELD

## 2017-06-09 DIAGNOSIS — K750 Abscess of liver: Secondary | ICD-10-CM | POA: Diagnosis not present

## 2017-06-09 DIAGNOSIS — Z9049 Acquired absence of other specified parts of digestive tract: Secondary | ICD-10-CM | POA: Diagnosis not present

## 2017-06-09 NOTE — Progress Notes (Signed)
He is scheduled for 06/16/17 at 11:15 with Dr. Hyman HopesWebb.  He has to be there at 10:45 for check in.

## 2017-06-11 ENCOUNTER — Telehealth: Payer: Self-pay | Admitting: *Deleted

## 2017-06-11 NOTE — Telephone Encounter (Signed)
Dr Rhea BeltonPyrtle has received [facility] MRI abdomen/MRCP completed on 06/09/17. Per Dr Rhea BeltonPyrtle, "Please inform patient results of follow up MRI look great!! No residual liver abscess and no bile duct abnormalities. JMP"  I have spoken to patient to advise patient of this and he verbalizes understanding.

## 2017-06-16 DIAGNOSIS — E1122 Type 2 diabetes mellitus with diabetic chronic kidney disease: Secondary | ICD-10-CM | POA: Diagnosis not present

## 2017-06-16 DIAGNOSIS — N2581 Secondary hyperparathyroidism of renal origin: Secondary | ICD-10-CM | POA: Diagnosis not present

## 2017-06-16 DIAGNOSIS — D631 Anemia in chronic kidney disease: Secondary | ICD-10-CM | POA: Diagnosis not present

## 2017-06-16 DIAGNOSIS — N183 Chronic kidney disease, stage 3 (moderate): Secondary | ICD-10-CM | POA: Diagnosis not present

## 2017-06-17 ENCOUNTER — Encounter: Payer: Self-pay | Admitting: Internal Medicine

## 2017-06-21 ENCOUNTER — Telehealth: Payer: Self-pay

## 2017-06-21 NOTE — Telephone Encounter (Signed)
Patient called requesting an appointment for right knee pain that has been going on for weeks now. Ok to add on tomorrow's schedule?

## 2017-06-21 NOTE — Telephone Encounter (Signed)
He needs to see orthopedist. Does he have one? If not, try [facility]

## 2017-06-21 NOTE — Telephone Encounter (Signed)
Pt was notified of Dr. Beryle QuantBaxley's instructions, pt verbalized understanding.  He said he has an Scientist, research (life sciences)orthopedist.

## 2017-07-02 DIAGNOSIS — N183 Chronic kidney disease, stage 3 (moderate): Secondary | ICD-10-CM | POA: Diagnosis not present

## 2017-07-21 DIAGNOSIS — I129 Hypertensive chronic kidney disease with stage 1 through stage 4 chronic kidney disease, or unspecified chronic kidney disease: Secondary | ICD-10-CM | POA: Diagnosis not present

## 2017-08-04 DIAGNOSIS — N2581 Secondary hyperparathyroidism of renal origin: Secondary | ICD-10-CM | POA: Diagnosis not present

## 2017-08-04 DIAGNOSIS — E1122 Type 2 diabetes mellitus with diabetic chronic kidney disease: Secondary | ICD-10-CM | POA: Diagnosis not present

## 2017-08-04 DIAGNOSIS — D631 Anemia in chronic kidney disease: Secondary | ICD-10-CM | POA: Diagnosis not present

## 2017-08-04 DIAGNOSIS — N183 Chronic kidney disease, stage 3 (moderate): Secondary | ICD-10-CM | POA: Diagnosis not present

## 2017-09-30 ENCOUNTER — Other Ambulatory Visit: Payer: Self-pay | Admitting: Internal Medicine

## 2017-09-30 DIAGNOSIS — E119 Type 2 diabetes mellitus without complications: Secondary | ICD-10-CM

## 2017-09-30 DIAGNOSIS — Z0000 Encounter for general adult medical examination without abnormal findings: Secondary | ICD-10-CM

## 2017-09-30 DIAGNOSIS — E785 Hyperlipidemia, unspecified: Secondary | ICD-10-CM

## 2017-09-30 DIAGNOSIS — Z125 Encounter for screening for malignant neoplasm of prostate: Secondary | ICD-10-CM

## 2017-10-14 ENCOUNTER — Other Ambulatory Visit: Payer: BLUE CROSS/BLUE SHIELD | Admitting: Internal Medicine

## 2017-10-14 DIAGNOSIS — E119 Type 2 diabetes mellitus without complications: Secondary | ICD-10-CM

## 2017-10-14 DIAGNOSIS — Z125 Encounter for screening for malignant neoplasm of prostate: Secondary | ICD-10-CM

## 2017-10-14 DIAGNOSIS — Z0000 Encounter for general adult medical examination without abnormal findings: Secondary | ICD-10-CM

## 2017-10-14 DIAGNOSIS — E785 Hyperlipidemia, unspecified: Secondary | ICD-10-CM

## 2017-10-15 ENCOUNTER — Other Ambulatory Visit: Payer: Self-pay | Admitting: Internal Medicine

## 2017-10-15 LAB — MICROALBUMIN / CREATININE URINE RATIO
Creatinine, Urine: 106 mg/dL (ref 20–320)
MICROALB/CREAT RATIO: 7 ug/mg{creat} (ref <30)
Microalb, Ur: 0.7 mg/dL

## 2017-10-15 LAB — CBC WITH DIFFERENTIAL/PLATELET
BASOS PCT: 0.3 %
Basophils Absolute: 19 cells/uL (ref 0–200)
EOS ABS: 158 {cells}/uL (ref 15–500)
Eosinophils Relative: 2.5 %
HEMATOCRIT: 40.8 % (ref 38.5–50.0)
HEMOGLOBIN: 14.3 g/dL (ref 13.2–17.1)
LYMPHS ABS: 2255 {cells}/uL (ref 850–3900)
MCH: 31.2 pg (ref 27.0–33.0)
MCHC: 35 g/dL (ref 32.0–36.0)
MCV: 89.1 fL (ref 80.0–100.0)
MPV: 10.1 fL (ref 7.5–12.5)
Monocytes Relative: 5.7 %
NEUTROS ABS: 3509 {cells}/uL (ref 1500–7800)
Neutrophils Relative %: 55.7 %
PLATELETS: 175 10*3/uL (ref 140–400)
RBC: 4.58 10*6/uL (ref 4.20–5.80)
RDW: 12.7 % (ref 11.0–15.0)
TOTAL LYMPHOCYTE: 35.8 %
WBC: 6.3 10*3/uL (ref 3.8–10.8)
WBCMIX: 359 {cells}/uL (ref 200–950)

## 2017-10-15 LAB — COMPLETE METABOLIC PANEL WITH GFR
AG RATIO: 2.1 (calc) (ref 1.0–2.5)
ALT: 12 U/L (ref 9–46)
AST: 19 U/L (ref 10–35)
Albumin: 4.8 g/dL (ref 3.6–5.1)
Alkaline phosphatase (APISO): 77 U/L (ref 40–115)
BUN/Creatinine Ratio: 14 (calc) (ref 6–22)
BUN: 19 mg/dL (ref 7–25)
CALCIUM: 9.8 mg/dL (ref 8.6–10.3)
CO2: 26 mmol/L (ref 20–32)
Chloride: 103 mmol/L (ref 98–110)
Creat: 1.36 mg/dL — ABNORMAL HIGH (ref 0.70–1.33)
GFR, EST NON AFRICAN AMERICAN: 60 mL/min/{1.73_m2} (ref >=60)
GFR, Est African American: 70 mL/min/{1.73_m2} (ref >=60)
GLUCOSE: 117 mg/dL — AB (ref 65–99)
Globulin: 2.3 g/dL (calc) (ref 1.9–3.7)
POTASSIUM: 4.5 mmol/L (ref 3.5–5.3)
Sodium: 138 mmol/L (ref 135–146)
Total Bilirubin: 1.3 mg/dL — ABNORMAL HIGH (ref 0.2–1.2)
Total Protein: 7.1 g/dL (ref 6.1–8.1)

## 2017-10-15 LAB — LIPID PANEL
CHOL/HDL RATIO: 3.8 (calc) (ref <5.0)
CHOLESTEROL: 196 mg/dL (ref <200)
HDL: 52 mg/dL (ref >40)
LDL CHOLESTEROL (CALC): 124 mg/dL — AB
Non-HDL Cholesterol (Calc): 144 mg/dL (calc) — ABNORMAL HIGH (ref <130)
Triglycerides: 94 mg/dL (ref <150)

## 2017-10-15 LAB — HEMOGLOBIN A1C
EAG (MMOL/L): 9.2 (calc)
HEMOGLOBIN A1C: 7.4 %{Hb} — AB (ref <5.7)
Mean Plasma Glucose: 166 (calc)

## 2017-10-15 LAB — PSA: PSA: 1.6 ng/mL (ref <=4.0)

## 2017-10-19 ENCOUNTER — Encounter: Payer: Self-pay | Admitting: Internal Medicine

## 2017-10-19 ENCOUNTER — Ambulatory Visit (INDEPENDENT_AMBULATORY_CARE_PROVIDER_SITE_OTHER): Payer: BLUE CROSS/BLUE SHIELD | Admitting: Internal Medicine

## 2017-10-19 VITALS — BP 112/90 | HR 86 | Temp 98.2°F | Ht 67.0 in | Wt 146.0 lb

## 2017-10-19 DIAGNOSIS — Z0000 Encounter for general adult medical examination without abnormal findings: Secondary | ICD-10-CM

## 2017-10-19 DIAGNOSIS — Z23 Encounter for immunization: Secondary | ICD-10-CM | POA: Diagnosis not present

## 2017-10-19 DIAGNOSIS — I10 Essential (primary) hypertension: Secondary | ICD-10-CM

## 2017-10-19 DIAGNOSIS — N183 Chronic kidney disease, stage 3 unspecified: Secondary | ICD-10-CM

## 2017-10-19 DIAGNOSIS — E785 Hyperlipidemia, unspecified: Secondary | ICD-10-CM | POA: Diagnosis not present

## 2017-10-19 DIAGNOSIS — E119 Type 2 diabetes mellitus without complications: Secondary | ICD-10-CM

## 2017-10-19 LAB — POCT URINALYSIS DIPSTICK
Appearance: NORMAL
BILIRUBIN UA: NEGATIVE
Glucose, UA: NEGATIVE
Ketones, UA: NEGATIVE
Leukocytes, UA: NEGATIVE
Nitrite, UA: NEGATIVE
Odor: NORMAL
PH UA: 6 (ref 5.0–8.0)
Protein, UA: NEGATIVE
RBC UA: NEGATIVE
Spec Grav, UA: 1.015 (ref 1.010–1.025)
UROBILINOGEN UA: 0.2 U/dL

## 2017-10-19 NOTE — Progress Notes (Signed)
Subjective:    Patient ID: Nicolas Chandler, male    DOB: 07/27/1965, 52 y.o.   MRN: [id number]  HPI 52 year old Male for health maintenance exam and evaluation of medical  Issues. Went to [place] in December 2018 and had complete check up including panendoscopy. Says there were no worrisome findings. Asked him if report was in [place], says he will check and see. Says tests are cheaper in [place].  During the summer 2018 he was in [place] on vacation and developed septicemia and DKA and was hospitalized there from June 13 through June 17.  He was found to have a retained stent and the stone.  He underwent ERCP with sphincterotomy in late June.  He had a hepatic dome abscess drained by Radiology.  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 Checks on rooms. No heavy lifting. Playing some golf.  Vague complaint RLQ pain. No nausea or vomiting.  Reminded about diabetic eye exam which is past due.  His fasting glucose was 117, creatinine is 1.36 and 4 months ago was 1.51.  Potassium is normal at 4.5.  He has very slight elevation of bilirubin at 1.3 but otherwise normal liver function tests.  Hemoglobin A1c 7.4%.  PSA is normal.  He remains on Amaryl and Actos.  He is on amlodipine 5 mg daily.  Blood pressure is excellent at 112/90.  His weight is 146 pounds.  LDL has improved from 169 to 124.  He has no signs of anemia.  Hemoglobin 14.3 g.  Potassium is normal at 4.5.  He was seen by [facility], Dr. Hyman Hopes who diagnosed him with stage III chronic kidney disease.  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  Lisinopril was discontinued.  Ultrasound of his kidneys in September 2018 showed no hydronephrosis or mass.  Social history: He is married.  He has a Event organiser.  He has 2 daughters.  Family history: Father with history of hypertension, kidney stones, thyroid disorder status post  coronary artery bypass surgery.  Mother with history of thyroidectomy.  Brother in his 56s with history of overactive thyroid.  No known drug allergies  Long-standing history of goiter since childhood.  He first presented to this office in February 2015 at which time he had been in the [place] for about 7 years and has had diabetes for about 10 years.  Vaccinated with BCG as a child.  Was told by orthopedist in [place] in 2012 that he had a left rotator cuff tear based on radiology study but these records are not available.  He was treated conservatively.  He had surgery with Dr. Renae Gloss December 2014 with laparoscopic cholecystectomy.  He also had severe pancreatitis related to gallstones.  He has seen Dr. Lucianne Muss for diabetic management but not recently.        Review of Systems no new complaints     Objective:   Physical Exam  Constitutional: He is oriented to person, place, and time. He appears well-developed and well-nourished. No distress.  HENT:  Head: Normocephalic and atraumatic.  Right Ear: External ear normal.  Left Ear: External ear normal.  Mouth/Throat: Oropharynx is clear and moist.  Eyes: Pupils are equal, round, and reactive to light. Conjunctivae and EOM are normal. Right eye exhibits no discharge. Left eye exhibits no discharge. No scleral icterus.  Neck: No JVD present.  Mildly enlarged thyroid  Cardiovascular: Normal rate,  regular rhythm and normal heart sounds. Exam reveals no gallop.  No murmur heard. Pulmonary/Chest: Effort normal and breath sounds normal.  Abdominal: Soft. Bowel sounds are normal. He exhibits no distension and no mass. There is no tenderness. There is no rebound and no guarding.  Genitourinary: Prostate normal.  Musculoskeletal: He exhibits no edema.  Lymphadenopathy:    He has no cervical adenopathy.  Neurological: He is oriented to person, place, and time. He displays normal reflexes. No cranial nerve deficit or sensory  deficit. He exhibits normal muscle tone. Coordination normal.  Skin: Skin is warm and dry. He is not diaphoretic.  Vitals reviewed.         Assessment & Plan:   Controlled type 2 diabetes mellitus-stable on oral agents  History of goiter  History of chronic kidney disease stage III unable to tolerate lisinopril because of hyperkalemia and increased serum creatinine  Hyperlipidemia-stable  Vague right lower quadrant pain-she is not significantly tender in the right lower quadrant today.  May be musculoskeletal pain from playing golf  Plan: He had apparent panendoscopy and December 2018 in [place] while visiting relatives.  He will see if he has copy of records that are in [place] for my review.  Otherwise return in 6 months and continue same medications.  Tetanus immunization update given today

## 2017-10-19 NOTE — Patient Instructions (Addendum)
It was a pleasure to see you today.  Continue same medications and follow-up in 6 months.  Tetanus immunization given today

## 2017-11-17 ENCOUNTER — Other Ambulatory Visit: Payer: Self-pay | Admitting: Internal Medicine

## 2018-03-21 DIAGNOSIS — M5386 Other specified dorsopathies, lumbar region: Secondary | ICD-10-CM | POA: Diagnosis not present

## 2018-03-21 DIAGNOSIS — M5417 Radiculopathy, lumbosacral region: Secondary | ICD-10-CM | POA: Diagnosis not present

## 2018-03-21 DIAGNOSIS — M9903 Segmental and somatic dysfunction of lumbar region: Secondary | ICD-10-CM | POA: Diagnosis not present

## 2018-03-21 DIAGNOSIS — M5137 Other intervertebral disc degeneration, lumbosacral region: Secondary | ICD-10-CM | POA: Diagnosis not present

## 2018-03-23 DIAGNOSIS — M5417 Radiculopathy, lumbosacral region: Secondary | ICD-10-CM | POA: Diagnosis not present

## 2018-03-23 DIAGNOSIS — M9903 Segmental and somatic dysfunction of lumbar region: Secondary | ICD-10-CM | POA: Diagnosis not present

## 2018-03-23 DIAGNOSIS — M5137 Other intervertebral disc degeneration, lumbosacral region: Secondary | ICD-10-CM | POA: Diagnosis not present

## 2018-03-23 DIAGNOSIS — M5386 Other specified dorsopathies, lumbar region: Secondary | ICD-10-CM | POA: Diagnosis not present

## 2018-03-28 DIAGNOSIS — M5137 Other intervertebral disc degeneration, lumbosacral region: Secondary | ICD-10-CM | POA: Diagnosis not present

## 2018-03-28 DIAGNOSIS — M9903 Segmental and somatic dysfunction of lumbar region: Secondary | ICD-10-CM | POA: Diagnosis not present

## 2018-03-28 DIAGNOSIS — M5417 Radiculopathy, lumbosacral region: Secondary | ICD-10-CM | POA: Diagnosis not present

## 2018-03-28 DIAGNOSIS — M5386 Other specified dorsopathies, lumbar region: Secondary | ICD-10-CM | POA: Diagnosis not present

## 2018-03-31 DIAGNOSIS — M9903 Segmental and somatic dysfunction of lumbar region: Secondary | ICD-10-CM | POA: Diagnosis not present

## 2018-03-31 DIAGNOSIS — M5417 Radiculopathy, lumbosacral region: Secondary | ICD-10-CM | POA: Diagnosis not present

## 2018-03-31 DIAGNOSIS — M5137 Other intervertebral disc degeneration, lumbosacral region: Secondary | ICD-10-CM | POA: Diagnosis not present

## 2018-04-06 DIAGNOSIS — M9903 Segmental and somatic dysfunction of lumbar region: Secondary | ICD-10-CM | POA: Diagnosis not present

## 2018-04-06 DIAGNOSIS — M5137 Other intervertebral disc degeneration, lumbosacral region: Secondary | ICD-10-CM | POA: Diagnosis not present

## 2018-04-06 DIAGNOSIS — M5417 Radiculopathy, lumbosacral region: Secondary | ICD-10-CM | POA: Diagnosis not present

## 2018-04-06 DIAGNOSIS — M5386 Other specified dorsopathies, lumbar region: Secondary | ICD-10-CM | POA: Diagnosis not present

## 2018-04-11 DIAGNOSIS — M5417 Radiculopathy, lumbosacral region: Secondary | ICD-10-CM | POA: Diagnosis not present

## 2018-04-11 DIAGNOSIS — M9903 Segmental and somatic dysfunction of lumbar region: Secondary | ICD-10-CM | POA: Diagnosis not present

## 2018-04-13 DIAGNOSIS — M5137 Other intervertebral disc degeneration, lumbosacral region: Secondary | ICD-10-CM | POA: Diagnosis not present

## 2018-04-13 DIAGNOSIS — M5386 Other specified dorsopathies, lumbar region: Secondary | ICD-10-CM | POA: Diagnosis not present

## 2018-04-13 DIAGNOSIS — M5417 Radiculopathy, lumbosacral region: Secondary | ICD-10-CM | POA: Diagnosis not present

## 2018-04-13 DIAGNOSIS — M9903 Segmental and somatic dysfunction of lumbar region: Secondary | ICD-10-CM | POA: Diagnosis not present

## 2018-04-18 DIAGNOSIS — M9903 Segmental and somatic dysfunction of lumbar region: Secondary | ICD-10-CM | POA: Diagnosis not present

## 2018-04-18 DIAGNOSIS — M5417 Radiculopathy, lumbosacral region: Secondary | ICD-10-CM | POA: Diagnosis not present

## 2018-04-18 DIAGNOSIS — M5137 Other intervertebral disc degeneration, lumbosacral region: Secondary | ICD-10-CM | POA: Diagnosis not present

## 2018-04-18 DIAGNOSIS — M5386 Other specified dorsopathies, lumbar region: Secondary | ICD-10-CM | POA: Diagnosis not present

## 2018-04-25 DIAGNOSIS — M5137 Other intervertebral disc degeneration, lumbosacral region: Secondary | ICD-10-CM | POA: Diagnosis not present

## 2018-04-25 DIAGNOSIS — M9903 Segmental and somatic dysfunction of lumbar region: Secondary | ICD-10-CM | POA: Diagnosis not present

## 2018-04-25 DIAGNOSIS — M5386 Other specified dorsopathies, lumbar region: Secondary | ICD-10-CM | POA: Diagnosis not present

## 2018-04-25 DIAGNOSIS — M5417 Radiculopathy, lumbosacral region: Secondary | ICD-10-CM | POA: Diagnosis not present

## 2018-04-28 ENCOUNTER — Other Ambulatory Visit: Payer: BLUE CROSS/BLUE SHIELD | Admitting: Internal Medicine

## 2018-04-28 DIAGNOSIS — E119 Type 2 diabetes mellitus without complications: Secondary | ICD-10-CM

## 2018-04-29 ENCOUNTER — Ambulatory Visit: Payer: BLUE CROSS/BLUE SHIELD | Admitting: Internal Medicine

## 2018-04-29 ENCOUNTER — Encounter: Payer: Self-pay | Admitting: Internal Medicine

## 2018-04-29 VITALS — BP 130/100 | HR 84 | Ht 67.0 in | Wt 150.0 lb

## 2018-04-29 DIAGNOSIS — I10 Essential (primary) hypertension: Secondary | ICD-10-CM

## 2018-04-29 LAB — BASIC METABOLIC PANEL
BUN / CREAT RATIO: 13 (calc) (ref 6–22)
BUN: 18 mg/dL (ref 7–25)
CO2: 28 mmol/L (ref 20–32)
Calcium: 9.8 mg/dL (ref 8.6–10.3)
Chloride: 102 mmol/L (ref 98–110)
Creat: 1.44 mg/dL — ABNORMAL HIGH (ref 0.70–1.33)
Glucose, Bld: 157 mg/dL — ABNORMAL HIGH (ref 65–99)
Potassium: 4.6 mmol/L (ref 3.5–5.3)
SODIUM: 138 mmol/L (ref 135–146)

## 2018-04-29 LAB — HEMOGLOBIN A1C
Hgb A1c MFr Bld: 9.1 % of total Hgb — ABNORMAL HIGH (ref <5.7)
Mean Plasma Glucose: 214 (calc)
eAG (mmol/L): 11.9 (calc)

## 2018-04-29 NOTE — Progress Notes (Addendum)
   Subjective:    Patient ID: Nicolas Chandler, male    DOB: 10/16/1965, 52 y.o.   MRN: [id number]  HPI 52 year old [place] Male for 6 month follow up.  History of stage III chronic kidney disease.  Has seen Dr. Hyman HopesWebb in the past.  Dr. 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  In September 2018 ultrasound of his kidneys showed no hydronephrosis or mass.  He remains on amlodipine 5 mg daily.  History of elevated LDL.  BUN is stable with creatinine of 1.44.  Glucose is 157.  Sodium and potassium are normal.  His physical exam is due late May 2020.  He tells me that 5 weeks ago he had some right shoulder pain in his right second and third fingers went numb.  He was placed on a Medrol Dosepak and an MRI of the neck was ordered.  Apparently he has improved.  Kidney disease manifested by elevated serum creatinine appeared after being hospitalized for septicemia, DKA, retained stent and gallstone requiring ERCP with sphincterotomy.  He required hospitalization while on vacation in [place] during the summer 2018.  He was hypotensive at the time.  He had a hepatic dome abscess drained by radiology.   Longstanding history of diabetes mellitus.  Tries to watch his diet.  Has seen Dr. Lucianne MussKumar in the past but not recently.    Review of Systems     Objective:   Physical Exam Neck is supple without JVD thyromegaly or carotid bruits.  Vital signs reviewed and are stable.  Skin warm and dry.  Nodes none.  Chest clear to auscultation.  Cardiac exam regular rate and rhythm normal S1 and S2.  Extremities without edema.  See diabetic foot exam.       Assessment & Plan:  Type 2 diabetes mellitus.  Unfortunately hemoglobin A1c has increased from 7.4% to 9.1%.  Patient does not want to change medications at this point in time.  I suggested he check with [facility] about return visit for elevated creatinine.  He has follow-up here in February  with regard to A1c.  He wants to have Hepatitis B vaccine at that time.  He tested negative for Hepatitis B when he was in [place].

## 2018-04-29 NOTE — Patient Instructions (Addendum)
Order Hep B vaccine #1. Check with [facility] about return visit. Check B-met. Work on diabetic control.

## 2018-05-02 DIAGNOSIS — M5417 Radiculopathy, lumbosacral region: Secondary | ICD-10-CM | POA: Diagnosis not present

## 2018-05-02 DIAGNOSIS — M5137 Other intervertebral disc degeneration, lumbosacral region: Secondary | ICD-10-CM | POA: Diagnosis not present

## 2018-05-02 DIAGNOSIS — M5386 Other specified dorsopathies, lumbar region: Secondary | ICD-10-CM | POA: Diagnosis not present

## 2018-05-02 DIAGNOSIS — M9903 Segmental and somatic dysfunction of lumbar region: Secondary | ICD-10-CM | POA: Diagnosis not present

## 2018-05-11 DIAGNOSIS — M5137 Other intervertebral disc degeneration, lumbosacral region: Secondary | ICD-10-CM | POA: Diagnosis not present

## 2018-05-11 DIAGNOSIS — M5386 Other specified dorsopathies, lumbar region: Secondary | ICD-10-CM | POA: Diagnosis not present

## 2018-05-11 DIAGNOSIS — M9903 Segmental and somatic dysfunction of lumbar region: Secondary | ICD-10-CM | POA: Diagnosis not present

## 2018-05-11 DIAGNOSIS — M5417 Radiculopathy, lumbosacral region: Secondary | ICD-10-CM | POA: Diagnosis not present

## 2018-05-23 DIAGNOSIS — M5417 Radiculopathy, lumbosacral region: Secondary | ICD-10-CM | POA: Diagnosis not present

## 2018-05-23 DIAGNOSIS — M5386 Other specified dorsopathies, lumbar region: Secondary | ICD-10-CM | POA: Diagnosis not present

## 2018-05-23 DIAGNOSIS — M5137 Other intervertebral disc degeneration, lumbosacral region: Secondary | ICD-10-CM | POA: Diagnosis not present

## 2018-05-23 DIAGNOSIS — M9903 Segmental and somatic dysfunction of lumbar region: Secondary | ICD-10-CM | POA: Diagnosis not present

## 2018-07-01 ENCOUNTER — Other Ambulatory Visit: Payer: BLUE CROSS/BLUE SHIELD | Admitting: Internal Medicine

## 2018-07-05 ENCOUNTER — Other Ambulatory Visit: Payer: BLUE CROSS/BLUE SHIELD | Admitting: Internal Medicine

## 2018-07-07 ENCOUNTER — Other Ambulatory Visit (INDEPENDENT_AMBULATORY_CARE_PROVIDER_SITE_OTHER): Payer: BLUE CROSS/BLUE SHIELD | Admitting: Internal Medicine

## 2018-07-07 ENCOUNTER — Encounter: Payer: Self-pay | Admitting: Internal Medicine

## 2018-07-07 VITALS — BP 120/80 | HR 80 | Temp 98.3°F | Ht 67.0 in | Wt 136.0 lb

## 2018-07-07 DIAGNOSIS — Z23 Encounter for immunization: Secondary | ICD-10-CM

## 2018-07-07 DIAGNOSIS — E119 Type 2 diabetes mellitus without complications: Secondary | ICD-10-CM | POA: Diagnosis not present

## 2018-07-07 MED ORDER — HEPATITIS B VAC RECOMBINANT 10 MCG/0.5ML IJ SUSP: 0.5000 mL | Freq: Once | INTRAMUSCULAR | Status: DC

## 2018-07-07 NOTE — Addendum Note (Signed)
Addended by: Gregery Na on: 07/07/2018 11:43 AM   Modules accepted: Orders

## 2018-07-07 NOTE — Addendum Note (Signed)
Addended by: Gregery Na on: 07/07/2018 01:46 PM   Modules accepted: Orders

## 2018-07-07 NOTE — Addendum Note (Signed)
Addended by: Gregery Na on: 07/07/2018 01:02 PM   Modules accepted: Orders

## 2018-07-07 NOTE — Patient Instructions (Signed)
Patient received Hep B vaccine L arm, AV, CMA.

## 2018-07-08 DIAGNOSIS — Z23 Encounter for immunization: Secondary | ICD-10-CM | POA: Diagnosis not present

## 2018-07-08 LAB — HEMOGLOBIN A1C
Hgb A1c MFr Bld: 7.8 % of total Hgb — ABNORMAL HIGH (ref <5.7)
Mean Plasma Glucose: 177 (calc)
eAG (mmol/L): 9.8 (calc)

## 2018-07-08 MED ORDER — HEPATITIS B VAC RECOMBINANT 10 MCG/0.5ML IJ SUSP: 0.5000 mL | Freq: Once | INTRAMUSCULAR | Status: DC

## 2018-07-08 NOTE — Addendum Note (Signed)
Addended by: Gregery Na on: 07/08/2018 12:39 PM   Modules accepted: Orders

## 2018-07-08 NOTE — Addendum Note (Signed)
Addended by: Gregery Na on: 07/08/2018 12:22 PM   Modules accepted: Orders

## 2018-07-08 NOTE — Progress Notes (Signed)
2

## 2018-10-18 ENCOUNTER — Other Ambulatory Visit: Payer: Self-pay | Admitting: Internal Medicine

## 2018-10-18 NOTE — Telephone Encounter (Signed)
Office visit and labs scheduled

## 2018-10-18 NOTE — Telephone Encounter (Signed)
Needs visit in June. Please call before refilling. Make this 6 month recheck with lipid liver [facility]

## 2018-10-20 ENCOUNTER — Other Ambulatory Visit: Payer: Self-pay | Admitting: Internal Medicine

## 2018-10-27 ENCOUNTER — Other Ambulatory Visit: Payer: BC Managed Care – PPO | Admitting: Internal Medicine

## 2018-10-27 DIAGNOSIS — I10 Essential (primary) hypertension: Secondary | ICD-10-CM

## 2018-10-27 DIAGNOSIS — E119 Type 2 diabetes mellitus without complications: Secondary | ICD-10-CM | POA: Diagnosis not present

## 2018-10-27 DIAGNOSIS — E785 Hyperlipidemia, unspecified: Secondary | ICD-10-CM | POA: Diagnosis not present

## 2018-10-28 ENCOUNTER — Ambulatory Visit (INDEPENDENT_AMBULATORY_CARE_PROVIDER_SITE_OTHER): Payer: BC Managed Care – PPO | Admitting: Internal Medicine

## 2018-10-28 ENCOUNTER — Encounter: Payer: Self-pay | Admitting: Internal Medicine

## 2018-10-28 VITALS — BP 120/80 | HR 81 | Ht 67.0 in | Wt 146.0 lb

## 2018-10-28 DIAGNOSIS — E7800 Pure hypercholesterolemia, unspecified: Secondary | ICD-10-CM | POA: Diagnosis not present

## 2018-10-28 DIAGNOSIS — I10 Essential (primary) hypertension: Secondary | ICD-10-CM

## 2018-10-28 DIAGNOSIS — E1169 Type 2 diabetes mellitus with other specified complication: Secondary | ICD-10-CM | POA: Diagnosis not present

## 2018-10-28 LAB — HEPATIC FUNCTION PANEL
AG Ratio: 2 (calc) (ref 1.0–2.5)
ALT: 13 U/L (ref 9–46)
AST: 16 U/L (ref 10–35)
Albumin: 4.5 g/dL (ref 3.6–5.1)
Alkaline phosphatase (APISO): 71 U/L (ref 35–144)
Bilirubin, Direct: 0.1 mg/dL (ref 0.0–0.2)
Globulin: 2.3 g/dL (calc) (ref 1.9–3.7)
Indirect Bilirubin: 0.6 mg/dL (calc) (ref 0.2–1.2)
Total Bilirubin: 0.7 mg/dL (ref 0.2–1.2)
Total Protein: 6.8 g/dL (ref 6.1–8.1)

## 2018-10-28 LAB — LIPID PANEL
Cholesterol: 240 mg/dL — ABNORMAL HIGH (ref <200)
HDL: 60 mg/dL (ref >=40)
LDL Cholesterol (Calc): 157 mg/dL (calc) — ABNORMAL HIGH
Non-HDL Cholesterol (Calc): 180 mg/dL (calc) — ABNORMAL HIGH (ref <130)
Total CHOL/HDL Ratio: 4 (calc) (ref <5.0)
Triglycerides: 116 mg/dL (ref <150)

## 2018-10-28 LAB — HEMOGLOBIN A1C
Hgb A1c MFr Bld: 7 % of total Hgb — ABNORMAL HIGH (ref <5.7)
Mean Plasma Glucose: 154 (calc)
eAG (mmol/L): 8.5 (calc)

## 2018-10-28 MED ORDER — ROSUVASTATIN CALCIUM 5 MG PO TABS: ORAL_TABLET | ORAL | 3 refills | Status: DC

## 2018-10-28 NOTE — Patient Instructions (Signed)
Start Crestor 5 mg 3 times weekly and return in November for health maintenance exam, evaluation of medical issues and fasting labs.  Continue other medications as previously prescribed.

## 2018-10-28 NOTE — Progress Notes (Signed)
   Subjective:    Patient ID: Nicolas Chandler, male    DOB: Nov 27, 1965, 53 y.o.   MRN: [id number]  HPI Patient seen for recheck on dibates and hyperlipidemia.  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  He likes the job.  He formally operated a [place].  His hemoglobin A1c is 7% and previously was 7.8% in February.  However, his lipid panel has worsened.  A year ago total cholesterol was 196, HDL 52 triglycerides 94 and LDL 124.  Now his total cholesterol was 240, HDL 60, triglycerides 116 and LDL 157.  I have persuaded him to try Crestor 5 mg 3 times a week.  Regarding diabetes he is on Actos 30 mg daily and metformin 1000 mg twice daily with food.  He also takes Amaryl 4 mg 2 tablets daily in the mornings.  For hypertension he is on amlodipine 5 mg daily.    Review of Systems no new complaints     Objective:   Physical Exam Vital signs blood pressure 120/80, pulse 81, pulse oximetry 96%, weight 146 pounds.  BMI 22.87.  Skin warm and dry.  Nodes none.  Neck is supple.  No JVD thyromegaly or carotid bruits appreciated.  Chest is clear to auscultation.  Cardiac exam regular rate and rhythm normal S1 and S2 without murmurs or ectopy.  No lower extremity edema.       Assessment & Plan:  I am pleased with his blood pressure and diabetic control.  He needs better control of his lipids and we started him on Crestor 5 mg 3 times a week with plans to recheck lipid panel and liver functions at time of physical exam in November.  Counseled regarding diet and exercise.

## 2018-11-07 DIAGNOSIS — N2581 Secondary hyperparathyroidism of renal origin: Secondary | ICD-10-CM | POA: Diagnosis not present

## 2018-11-07 DIAGNOSIS — D631 Anemia in chronic kidney disease: Secondary | ICD-10-CM | POA: Diagnosis not present

## 2018-11-07 DIAGNOSIS — I129 Hypertensive chronic kidney disease with stage 1 through stage 4 chronic kidney disease, or unspecified chronic kidney disease: Secondary | ICD-10-CM | POA: Diagnosis not present

## 2018-11-07 DIAGNOSIS — N189 Chronic kidney disease, unspecified: Secondary | ICD-10-CM | POA: Diagnosis not present

## 2018-11-07 DIAGNOSIS — N183 Chronic kidney disease, stage 3 (moderate): Secondary | ICD-10-CM | POA: Diagnosis not present

## 2018-12-07 ENCOUNTER — Other Ambulatory Visit: Payer: Self-pay | Admitting: Internal Medicine

## 2018-12-19 DIAGNOSIS — N183 Chronic kidney disease, stage 3 (moderate): Secondary | ICD-10-CM | POA: Diagnosis not present

## 2019-01-11 ENCOUNTER — Other Ambulatory Visit: Payer: Self-pay | Admitting: Internal Medicine

## 2019-01-23 DIAGNOSIS — N183 Chronic kidney disease, stage 3 (moderate): Secondary | ICD-10-CM | POA: Diagnosis not present

## 2019-01-24 DIAGNOSIS — N183 Chronic kidney disease, stage 3 (moderate): Secondary | ICD-10-CM | POA: Diagnosis not present

## 2019-03-30 ENCOUNTER — Other Ambulatory Visit: Payer: BC Managed Care – PPO | Admitting: Internal Medicine

## 2019-03-30 ENCOUNTER — Other Ambulatory Visit: Payer: Self-pay

## 2019-03-30 DIAGNOSIS — I10 Essential (primary) hypertension: Secondary | ICD-10-CM

## 2019-03-30 DIAGNOSIS — Z0000 Encounter for general adult medical examination without abnormal findings: Secondary | ICD-10-CM

## 2019-03-30 DIAGNOSIS — E7800 Pure hypercholesterolemia, unspecified: Secondary | ICD-10-CM | POA: Diagnosis not present

## 2019-03-30 DIAGNOSIS — E1169 Type 2 diabetes mellitus with other specified complication: Secondary | ICD-10-CM

## 2019-03-31 ENCOUNTER — Ambulatory Visit (INDEPENDENT_AMBULATORY_CARE_PROVIDER_SITE_OTHER): Payer: BC Managed Care – PPO | Admitting: Internal Medicine

## 2019-03-31 ENCOUNTER — Encounter: Payer: Self-pay | Admitting: Internal Medicine

## 2019-03-31 VITALS — BP 130/90 | HR 95 | Temp 97.9°F | Ht 67.0 in | Wt 144.0 lb

## 2019-03-31 DIAGNOSIS — Z0000 Encounter for general adult medical examination without abnormal findings: Secondary | ICD-10-CM

## 2019-03-31 DIAGNOSIS — I10 Essential (primary) hypertension: Secondary | ICD-10-CM

## 2019-03-31 DIAGNOSIS — E7800 Pure hypercholesterolemia, unspecified: Secondary | ICD-10-CM

## 2019-03-31 DIAGNOSIS — E1169 Type 2 diabetes mellitus with other specified complication: Secondary | ICD-10-CM

## 2019-03-31 DIAGNOSIS — N1831 Chronic kidney disease, stage 3a: Secondary | ICD-10-CM | POA: Diagnosis not present

## 2019-03-31 LAB — CBC WITH DIFFERENTIAL/PLATELET
Absolute Monocytes: 410 cells/uL (ref 200–950)
Basophils Absolute: 29 cells/uL (ref 0–200)
Basophils Relative: 0.4 %
Eosinophils Absolute: 158 cells/uL (ref 15–500)
Eosinophils Relative: 2.2 %
HCT: 43.9 % (ref 38.5–50.0)
Hemoglobin: 14.9 g/dL (ref 13.2–17.1)
Lymphs Abs: 2110 cells/uL (ref 850–3900)
MCH: 31.1 pg (ref 27.0–33.0)
MCHC: 33.9 g/dL (ref 32.0–36.0)
MCV: 91.6 fL (ref 80.0–100.0)
MPV: 10.4 fL (ref 7.5–12.5)
Monocytes Relative: 5.7 %
Neutro Abs: 4493 cells/uL (ref 1500–7800)
Neutrophils Relative %: 62.4 %
Platelets: 183 10*3/uL (ref 140–400)
RBC: 4.79 10*6/uL (ref 4.20–5.80)
RDW: 12.4 % (ref 11.0–15.0)
Total Lymphocyte: 29.3 %
WBC: 7.2 10*3/uL (ref 3.8–10.8)

## 2019-03-31 LAB — COMPLETE METABOLIC PANEL WITH GFR
AG Ratio: 2 (calc) (ref 1.0–2.5)
ALT: 18 U/L (ref 9–46)
AST: 17 U/L (ref 10–35)
Albumin: 4.8 g/dL (ref 3.6–5.1)
Alkaline phosphatase (APISO): 79 U/L (ref 35–144)
BUN/Creatinine Ratio: 12 (calc) (ref 6–22)
BUN: 16 mg/dL (ref 7–25)
CO2: 28 mmol/L (ref 20–32)
Calcium: 9.8 mg/dL (ref 8.6–10.3)
Chloride: 102 mmol/L (ref 98–110)
Creat: 1.37 mg/dL — ABNORMAL HIGH (ref 0.70–1.33)
GFR, Est African American: 69 mL/min/{1.73_m2} (ref >=60)
GFR, Est Non African American: 59 mL/min/{1.73_m2} — ABNORMAL LOW (ref >=60)
Globulin: 2.4 g/dL (calc) (ref 1.9–3.7)
Glucose, Bld: 213 mg/dL — ABNORMAL HIGH (ref 65–99)
Potassium: 4.1 mmol/L (ref 3.5–5.3)
Sodium: 141 mmol/L (ref 135–146)
Total Bilirubin: 1 mg/dL (ref 0.2–1.2)
Total Protein: 7.2 g/dL (ref 6.1–8.1)

## 2019-03-31 LAB — POCT URINALYSIS DIPSTICK
Appearance: NEGATIVE
Bilirubin, UA: NEGATIVE
Blood, UA: NEGATIVE
Glucose, UA: NEGATIVE
Ketones, UA: NEGATIVE
Leukocytes, UA: NEGATIVE
Nitrite, UA: NEGATIVE
Odor: NEGATIVE
Protein, UA: NEGATIVE
Spec Grav, UA: 1.01 (ref 1.010–1.025)
Urobilinogen, UA: 0.2 E.U./dL
pH, UA: 6.5 (ref 5.0–8.0)

## 2019-03-31 LAB — LIPID PANEL
Cholesterol: 216 mg/dL — ABNORMAL HIGH (ref <200)
HDL: 58 mg/dL (ref >=40)
LDL Cholesterol (Calc): 134 mg/dL (calc) — ABNORMAL HIGH
Non-HDL Cholesterol (Calc): 158 mg/dL (calc) — ABNORMAL HIGH (ref <130)
Total CHOL/HDL Ratio: 3.7 (calc) (ref <5.0)
Triglycerides: 126 mg/dL (ref <150)

## 2019-03-31 LAB — PSA: PSA: 1.5 ng/mL (ref <=4.0)

## 2019-03-31 LAB — HEMOGLOBIN A1C
Hgb A1c MFr Bld: 8.3 % of total Hgb — ABNORMAL HIGH (ref <5.7)
Mean Plasma Glucose: 192 (calc)
eAG (mmol/L): 10.6 (calc)

## 2019-03-31 NOTE — Progress Notes (Signed)
Subjective:    Patient ID: Nicolas Chandler, male    DOB: 05-27-1965, 53 y.o.   MRN: [id number]  HPI 53 year old male in today for health maintenance and evaluation of medical issues.  He had a complete checkup in [place] in December 2018 including panendoscopy.  He says there were no worrisome findings.  He says test are cheaper in [place].  During the summer 2018 he was in [place] on vacation and developed septicemia and DKA.  He was hospitalized there from June 13 through June 17.  He was found to have ascending cholangitis secondary to a retained stent.  He also had a retained stone.  He underwent ERCP with sphincterotomy with removal of stent.  He also had liver abscess requiring percutaneous drainage.  He has a history of hypertension and diabetes.  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  He is also followed at [facility] for chronic kidney disease stage III that developed after his hospitalization in [place].  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  ACE inhibitor was discontinued and ultrasound of his kidneys in September 2018 showed no hydronephrosis or mass.  His hemoglobin A1c recently was 8.3% 5 months ago was 7%.  His PSA is normal.  Total cholesterol was 216 with an LDL cholesterol of 134 which is improved from 5 months ago when total cholesterol was 240 and LDL cholesterol 157.  His creatinine is 1.37 and stable.  Potassium is 4.1.  Liver functions are normal.  Social history: He is married.  He has a Event organiser.  He has 2 daughters.  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  Family history: Father with history of hypertension, kidney stones, thyroid disorder status post coronary artery bypass surgery.  Mother with history of thyroidectomy.  Brother in his 69s with history of overactive thyroid.  No known drug  allergies.  Longstanding history of goiter since childhood.  He first presented to this office in February 2015 at which time he had been in [place] for about 7 years and had diabetes for about 10 years.  Vaccinated with BCG as a child.  Was told by orthopedist in [place] in 2012 that he had a left rotator cuff tear based on radiology studies but these records are not available.  He was treated conservatively.  He had laparoscopic cholecystectomy December 2014 and also had severe pancreatitis related to gallstones.  He has seen Dr. Lucianne Muss in the past for diabetic management but not recently.  Review of Systems his affect seems a little flat.  I think he is frustrated with not having worked at the present time.     Objective:   Physical Exam  Blood pressure 130/90 pulse 95 BMI 22.55 weight 144 pounds afebrile pulse oximetry 97%  Skin warm and dry.  Nodes none.  TMs are clear.  Neck is supple.  He has a symmetrical goiter.  Chest is clear to auscultation.  Cardiac exam regular rate rhythm normal S1 and S2 without murmurs or gallops.  Abdomen soft nondistended without hepatosplenomegaly masses or tenderness.  Prostate is normal without nodules.  No lower extremity edema.      Assessment & Plan:  Non-insulin-dependent diabetes mellitus currently treated with Actos Amaryl and and Metformin.  A1c is elevated from last visit.  Work on diet and exercise and follow-up in 4 months.  LDL has decreased from 157  to 134 over the past 5 months.  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  Recommend diabetic eye exam.  He was reminded again about this.  Finances may be a problem for him.

## 2019-04-01 LAB — MICROALBUMIN / CREATININE URINE RATIO
Creatinine, Urine: 54 mg/dL (ref 20–320)
Microalb Creat Ratio: 33 mcg/mg creat — ABNORMAL HIGH (ref <30)
Microalb, Ur: 1.8 mg/dL

## 2019-04-23 NOTE — Patient Instructions (Addendum)
Try to get more exercise.  Diabetic control could be better.  Follow-up in 4 months.  Continue current medications.  Please have diabetic eye exam.

## 2019-07-28 ENCOUNTER — Ambulatory Visit (HOSPITAL_COMMUNITY)
Admission: EM | Admit: 2019-07-28 | Discharge: 2019-07-28 | Disposition: A | Discharge status: Home / Self Care | Payer: BC Managed Care – PPO | Attending: Family Medicine | Admitting: Family Medicine

## 2019-07-28 ENCOUNTER — Encounter (HOSPITAL_COMMUNITY): Payer: Self-pay

## 2019-07-28 ENCOUNTER — Other Ambulatory Visit: Payer: Self-pay

## 2019-07-28 DIAGNOSIS — N200 Calculus of kidney: Secondary | ICD-10-CM | POA: Diagnosis not present

## 2019-07-28 HISTORY — DX: Pure hypercholesterolemia, unspecified: E78.00

## 2019-07-28 LAB — POCT URINALYSIS DIP (DEVICE)
Bilirubin Urine: NEGATIVE
Glucose, UA: 100 mg/dL — AB
Ketones, ur: NEGATIVE mg/dL
Leukocytes,Ua: NEGATIVE
Nitrite: NEGATIVE
Protein, ur: NEGATIVE mg/dL
Specific Gravity, Urine: 1.02 (ref 1.005–1.030)
Urobilinogen, UA: 0.2 mg/dL (ref 0.0–1.0)
pH: 6 (ref 5.0–8.0)

## 2019-07-28 MED ORDER — TAMSULOSIN HCL 0.4 MG PO CAPS: 0.4000 mg | ORAL_CAPSULE | Freq: Every day | ORAL | 0 refills | Status: AC

## 2019-07-28 MED ORDER — HYDROCODONE-ACETAMINOPHEN 7.5-325 MG PO TABS: 1.0000 | ORAL_TABLET | Freq: Four times a day (QID) | ORAL | 0 refills | Status: DC | PRN

## 2019-07-28 NOTE — ED Triage Notes (Addendum)
Pt presents with complaints of left sided lower abdominal pain x 4 days. Reports it got worse this afternoon. Reports taking ibuprofen at home with mild relief. Pt reports hematuria yesterday. Denies any other symptoms.

## 2019-07-28 NOTE — Discharge Instructions (Addendum)
Take the tamsulosin once a day.  Start today Take ibuprofen as needed for moderate pain Take hydrocodone as needed for severe pain Drink lots of water Do not take hydrocodone and drive Go to the emergency room for severe pain, vomiting, fever, or worsening of condition

## 2019-07-28 NOTE — ED Provider Notes (Signed)
[facility]    CSN: [id number] Arrival date & time: 07/28/19  6834      History   Chief Complaint Chief Complaint  Patient presents with  . Abdominal Pain    HPI Nicolas Chandler is a 54 y.o. male.   HPI  Very pleasant 54 year old gentleman who is here for left-sided abdominal pain.  Tenderness left mid abdomen radiating down toward the left groin.  It comes in waves.  States that ibuprofen does help somewhat.  No nausea or vomiting.  No fever or chills.  No diarrhea.  No change in bowels.  He states that he when he had a CT scan for his gallbladder surgery, he was found to have a small kidney stone on the left side.  He has never had kidney stone pain or treatment for kidney stones.  He has noticed hematuria, yesterday but none today.  No dysuria, frequency, suprapubic or bladder pain. Patient states he has diabetes that is well controlled.  He is compliant with regular physician follow-up.  He also has hypertension.  He gets regular blood work.  Past Medical History:  Diagnosis Date  . Bacteremia due to Klebsiella pneumoniae   . Cholecystitis 04/2013  . Diabetes mellitus without complication ([place])   . Elevated LFTs   . Goiter    Dr. Debarah Crape IM-Negative workup including biopsy of thyroid which was normal; brother has Graves and mother has thyroid disease  . High cholesterol   . History of kidney stones   . Hypertension   . Pancreatitis   . Pleural effusion   . Pneumonia   . Septic shock [facility])     Patient Active Problem List   Diagnosis Date Noted  . Choledocholithiasis   . Encounter for removal of biliary stent   . Pancreatic pseudocyst 09/05/2013  . Decreased thyroxine-3  level 05/26/2013  . Goiter 05/22/2013  . Renal cyst 05/12/2013  . DM (diabetes mellitus) ([place]) 05/09/2013  . HLD (hyperlipidemia) 05/09/2013  . Diabetes mellitus ([place]) 10/03/2010    Past Surgical History:  Procedure Laterality Date  . CHOLECYSTECTOMY N/A 05/17/2013   Procedure:  LAPAROSCOPIC CHOLECYSTECTOMY WITH INTRAOPERATIVE CHOLANGIOGRAM;  Surgeon: Gwenyth Ober, MD;  Location: [facility];  Service: General;  Laterality: N/A;  . ERCP N/A 05/10/2013   Procedure: ENDOSCOPIC RETROGRADE CHOLANGIOPANCREATOGRAPHY (ERCP);  Surgeon: Beryle Beams, MD;  Location: [facility] ENDOSCOPY;  Service: Endoscopy;  Laterality: N/A;  . ERCP N/A 11/18/2016   Procedure: ENDOSCOPIC RETROGRADE CHOLANGIOPANCREATOGRAPHY (ERCP);  Surgeon: Irene Shipper, MD;  Location: [facility] ENDOSCOPY;  Service: Endoscopy;  Laterality: N/A;  . hepatic drain  11/13/2016   for hepatic abcess  . IR CATHETER TUBE CHANGE  11/27/2016  . IR RADIOLOGIST EVAL & MGMT  11/26/2016  . IR RADIOLOGIST EVAL & MGMT  12/09/2016  . LAPAROSCOPIC CHOLECYSTECTOMY  05/17/13   Dr. Hulen Skains       Home Medications    Prior to Admission medications   Medication Sig Start Date End Date Taking? Authorizing Provider  amLODipine (NORVASC) 5 MG tablet Take 5 mg by mouth daily. 10/03/17  Yes [provider]  Ascorbic Acid (VITAMIN C) 1000 MG tablet Take 1,000 mg by mouth daily.   Yes [provider]  b complex vitamins capsule Take 1 capsule by mouth daily.   Yes [provider]  calcium-vitamin D (OSCAL WITH D) 500-200 MG-UNIT tablet Take 1 tablet by mouth.   Yes [provider]  glimepiride (AMARYL) 4 MG tablet TAKE 2 TABLETS BY MOUTH EVERY  MORNING 01/11/19  Yes Baxley, Luanna Cole, MD  glucose blood (BAYER CONTOUR NEXT TEST) test strip Use as instructed to check blood sugar once daily Dx code E11.9 02/14/16  Yes Reather Littler, MD  metFORMIN (GLUCOPHAGE) 1000 MG tablet TAKE 1 TABLET BY MOUTH TWICE A DAY WITH FOOD 01/11/19  Yes Baxley, Luanna Cole, MD  Omega-3 Fatty Acids (FISH OIL) 1000 MG CAPS Take 1 capsule by mouth daily.    Yes [provider]  pioglitazone (ACTOS) 30 MG tablet TAKE 1 TABLET BY MOUTH EVERY DAY 12/07/18  Yes Baxley, Luanna Cole, MD  rosuvastatin (CRESTOR) 5 MG tablet One po 3 times a week with supper for high  cholesterol. 10/28/18  Yes Baxley, Luanna Cole, MD  zinc sulfate 220 (50 Zn) MG capsule Take 220 mg by mouth daily.   Yes [provider]  HYDROcodone-acetaminophen (NORCO) 7.5-325 MG tablet Take 1 tablet by mouth every 6 (six) hours as needed for moderate pain. 07/28/19   Eustace Moore, MD  tamsulosin (FLOMAX) 0.4 MG CAPS capsule Take 1 capsule (0.4 mg total) by mouth daily. 07/28/19   Eustace Moore, MD    Family History Family History  Problem Relation Age of Onset  . Thyroid disease Mother        had thyroidectomy  . Thyroid disease Father        Has low thyroid in 2014  . Hypertension Father   . Thyroid disease Brother        Had radioactive iodine treatment  . Diabetes Paternal Uncle   . Colon cancer Neg Hx   . Esophageal cancer Neg Hx   . Stomach cancer Neg Hx     Social History Social History   Tobacco Use  . Smoking status: Never Smoker  . Smokeless tobacco: Never Used  Substance Use Topics  . Alcohol use: No  . Drug use: No     Allergies   Albumin (human) and Buscopan [scopolamine]   Review of Systems Review of Systems  Constitutional: Negative for chills and fever.  Gastrointestinal: Positive for abdominal pain. Negative for nausea and vomiting.  Genitourinary: Positive for flank pain and hematuria. Negative for dysuria and frequency.  Musculoskeletal: Negative for back pain.     Physical Exam Triage Vital Signs ED Triage Vitals  Enc Vitals Group     BP 07/28/19 1858 (!) 141/90     Pulse Rate 07/28/19 1858 87     Resp 07/28/19 1858 18     Temp 07/28/19 1858 98 F (36.7 C)     Temp src --      SpO2 07/28/19 1858 99 %     Weight --      Height --      Head Circumference --      Peak Flow --      Pain Score 07/28/19 1855 2     Pain Loc --      Pain Edu? --      Excl. in GC? --    No data found.  Updated Vital Signs BP (!) 141/90   Pulse 87   Temp 98 F (36.7 C)   Resp 18   SpO2 99%     Physical Exam Constitutional:       General: He is not in acute distress.    Appearance: He is well-developed and normal weight. He is not ill-appearing.  HENT:     Head: Normocephalic and atraumatic.     Nose:     Comments:  Mask is in place Eyes:     Conjunctiva/sclera: Conjunctivae normal.     Pupils: Pupils are equal, round, and reactive to light.  Cardiovascular:     Rate and Rhythm: Normal rate and regular rhythm.     Heart sounds: Normal heart sounds.  Pulmonary:     Effort: Pulmonary effort is normal. No respiratory distress.     Breath sounds: Normal breath sounds.  Abdominal:     General: Abdomen is flat. Bowel sounds are normal. There is no distension.     Palpations: Abdomen is soft.     Tenderness: There is abdominal tenderness.     Comments: Tenderness to deep palpation in the left mid abdomen.  No tenderness in the lower abdomen.  Mild CVA tenderness on the left to tapping.  Bowel sounds are active.  No organomegaly  Musculoskeletal:        General: Normal range of motion.     Cervical back: Normal range of motion.  Skin:    General: Skin is warm and dry.  Neurological:     Mental Status: He is alert.  Psychiatric:        Mood and Affect: Mood normal.        Behavior: Behavior normal.      UC Treatments / Results  Labs (all labs ordered are listed, but only abnormal results are displayed) Labs Reviewed  POCT URINALYSIS DIP (DEVICE) - Abnormal; Notable for the following components:      Result Value   Glucose, UA 100 (*)    Hgb urine dipstick MODERATE (*)    All other components within normal limits    EKG   Radiology No results found.  Procedures Procedures (including critical care time)  Medications Ordered in UC Medications - No data to display  Initial Impression / Assessment and Plan / UC Course  I have reviewed the triage vital signs and the nursing notes.  Pertinent labs & imaging results that were available during my care of the patient were reviewed by me and considered  in my medical decision making (see chart for details).     Likely kidney stone.  Discussed with patient.  He wishes to try outpatient management with pain management and fluids.  He understands that he needs to go to the emergency room promptly for any worsening of symptoms, vomiting, fever, inability to control pain Final Clinical Impressions(s) / UC Diagnoses   Final diagnoses:  Kidney stone     Discharge Instructions     Take the tamsulosin once a day.  Start today Take ibuprofen as needed for moderate pain Take hydrocodone as needed for severe pain Drink lots of water Do not take hydrocodone and drive Go to the emergency room for severe pain, vomiting, fever, or worsening of condition   ED Prescriptions    Medication Sig Dispense Auth. Provider   tamsulosin (FLOMAX) 0.4 MG CAPS capsule Take 1 capsule (0.4 mg total) by mouth daily. 30 capsule Eustace Moore, MD   HYDROcodone-acetaminophen [facility]) 7.5-325 MG tablet Take 1 tablet by mouth every 6 (six) hours as needed for moderate pain. 15 tablet Eustace Moore, MD     I have reviewed the PDMP during this encounter.   Eustace Moore, MD 07/28/19 [phone]

## 2019-08-03 ENCOUNTER — Other Ambulatory Visit: Payer: Self-pay

## 2019-08-03 ENCOUNTER — Other Ambulatory Visit: Payer: BC Managed Care – PPO | Admitting: Internal Medicine

## 2019-08-03 DIAGNOSIS — E1169 Type 2 diabetes mellitus with other specified complication: Secondary | ICD-10-CM

## 2019-08-03 DIAGNOSIS — E119 Type 2 diabetes mellitus without complications: Secondary | ICD-10-CM

## 2019-08-03 DIAGNOSIS — E7800 Pure hypercholesterolemia, unspecified: Secondary | ICD-10-CM | POA: Diagnosis not present

## 2019-08-04 ENCOUNTER — Ambulatory Visit (INDEPENDENT_AMBULATORY_CARE_PROVIDER_SITE_OTHER): Payer: BC Managed Care – PPO | Admitting: Internal Medicine

## 2019-08-04 ENCOUNTER — Encounter: Payer: Self-pay | Admitting: Internal Medicine

## 2019-08-04 VITALS — BP 120/80 | HR 73 | Ht 67.0 in | Wt 149.0 lb

## 2019-08-04 DIAGNOSIS — E7800 Pure hypercholesterolemia, unspecified: Secondary | ICD-10-CM

## 2019-08-04 DIAGNOSIS — E1169 Type 2 diabetes mellitus with other specified complication: Secondary | ICD-10-CM

## 2019-08-04 DIAGNOSIS — N1831 Chronic kidney disease, stage 3a: Secondary | ICD-10-CM | POA: Diagnosis not present

## 2019-08-04 DIAGNOSIS — I10 Essential (primary) hypertension: Secondary | ICD-10-CM

## 2019-08-04 LAB — HEMOGLOBIN A1C
Hgb A1c MFr Bld: 8.9 % of total Hgb — ABNORMAL HIGH (ref <5.7)
Mean Plasma Glucose: 209 (calc)
eAG (mmol/L): 11.6 (calc)

## 2019-08-04 NOTE — Progress Notes (Signed)
   Subjective:    Patient ID: Nicolas Chandler, male    DOB: 1965-10-05, 54 y.o.   MRN: [id number]  HPI 54 year old [place] Male seen for follow up on diabetes and hypertension. The summer 2018 he was in [place] on vacation and developed septicemia and DKA.  He was hospitalized there from June 13 through June 17.  Was found to have ascending cholangitis secondary to a retained stent.  He also had a retained stone.  He underwent ERCP with sphincterotomy with removal of stent.  He had liver abscess requiring percutaneous drainage.  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  He is also followed at [facility] for chronic kidney disease stage III that developed after his hospitalization in [place].  Was also diagnosed with secondary hyperparathyroidism and anemia secondary to renal failure.  ACE inhibitor discontinued.  Ultrasound of the kidneys September 2018 showed no hydronephrosis or mass.  The patient is very private but since wearing the pandemic has been a bit hard and stressful.  His hemoglobin A1c has increased from 8.3 November 20 20 to 8.9%.  We talked about changing medication but he prefers to give it a try with diet and exercise.  His lipid panel was not checked at this visit.  History of goiter since childhood.  No recent TSH.  Vaccinated with BCG as a child.  He first presented to this office in February 2015 at which time he had been in the [place] for some 7 years and had diabetes for about 10 years.  Has appointment for eye exam in May.  He was seen in the emergency department March 5 with left kidney stone.  No evidence of infection at that time.  He was treated with tamsulosin.  Says he is feeling better but still showing occult blood in his urine.  No pain.  He has 30 days of tamsulosin.  Review of Systems see above with no new complaints     Objective:   Physical Exam  Blood  pressure 120/80 pulse 73 temperature 98% BMI 23.34 weight 149 pounds.  Skin warm and dry.  No cervical adenopathy.  Chest clear.  No CVA tenderness.  Cardiac exam regular rate and rhythm.  No lower extremity edema.      Assessment & Plan:  Diabetes mellitus on oral agents-he may need to go back and see Dr. Dwyane Dee in the near future.  He agrees to return in 3 months for follow-up here.  He is on Actos, Metformin, Amaryl.  Essential hypertension stable on current regimen of amlodipine  Left kidney stone recently seen in the emergency department now on Flomax.  Chronic kidney disease stage III-creatinine not checked since November.  Will need follow-up in 59month.  Plan: He will follow-up here in 3 months.  At that time he will need hemoglobin A1c, lipid panel, c-Met.

## 2019-08-20 ENCOUNTER — Other Ambulatory Visit: Payer: Self-pay | Admitting: Internal Medicine

## 2019-08-23 NOTE — Patient Instructions (Signed)
Patient course of Flomax for kidney stone.  Work on diet exercise and sugar control.  Follow-up.  3 months.  At that time you will need to be fasting with hemoglobin A1c lipid panel and c-Met to be drawn.  Continue to take current medications.  Call if symptoms of kidney stone worsen.

## 2019-09-01 ENCOUNTER — Encounter: Payer: Self-pay | Admitting: Internal Medicine

## 2019-09-01 DIAGNOSIS — H0288A Meibomian gland dysfunction right eye, upper and lower eyelids: Secondary | ICD-10-CM | POA: Diagnosis not present

## 2019-09-01 DIAGNOSIS — E119 Type 2 diabetes mellitus without complications: Secondary | ICD-10-CM | POA: Diagnosis not present

## 2019-09-01 DIAGNOSIS — H0288B Meibomian gland dysfunction left eye, upper and lower eyelids: Secondary | ICD-10-CM | POA: Diagnosis not present

## 2019-09-01 DIAGNOSIS — H1045 Other chronic allergic conjunctivitis: Secondary | ICD-10-CM | POA: Diagnosis not present

## 2019-09-01 LAB — HM DIABETES EYE EXAM

## 2019-10-01 ENCOUNTER — Other Ambulatory Visit: Payer: Self-pay | Admitting: Internal Medicine

## 2019-10-11 ENCOUNTER — Other Ambulatory Visit: Payer: Self-pay | Admitting: Internal Medicine

## 2019-10-24 DIAGNOSIS — L814 Other melanin hyperpigmentation: Secondary | ICD-10-CM | POA: Diagnosis not present

## 2019-10-24 DIAGNOSIS — L538 Other specified erythematous conditions: Secondary | ICD-10-CM | POA: Diagnosis not present

## 2019-10-24 DIAGNOSIS — L218 Other seborrheic dermatitis: Secondary | ICD-10-CM | POA: Diagnosis not present

## 2019-10-24 DIAGNOSIS — B078 Other viral warts: Secondary | ICD-10-CM | POA: Diagnosis not present

## 2019-10-24 DIAGNOSIS — L821 Other seborrheic keratosis: Secondary | ICD-10-CM | POA: Diagnosis not present

## 2019-11-09 ENCOUNTER — Other Ambulatory Visit: Payer: BC Managed Care – PPO | Admitting: Internal Medicine

## 2019-11-09 ENCOUNTER — Other Ambulatory Visit: Payer: Self-pay

## 2019-11-09 DIAGNOSIS — E1169 Type 2 diabetes mellitus with other specified complication: Secondary | ICD-10-CM | POA: Diagnosis not present

## 2019-11-09 DIAGNOSIS — E7800 Pure hypercholesterolemia, unspecified: Secondary | ICD-10-CM

## 2019-11-10 ENCOUNTER — Encounter: Payer: Self-pay | Admitting: Internal Medicine

## 2019-11-10 ENCOUNTER — Ambulatory Visit (INDEPENDENT_AMBULATORY_CARE_PROVIDER_SITE_OTHER): Payer: BC Managed Care – PPO | Admitting: Internal Medicine

## 2019-11-10 VITALS — BP 120/88 | HR 82 | Ht 67.0 in | Wt 148.0 lb

## 2019-11-10 DIAGNOSIS — E1169 Type 2 diabetes mellitus with other specified complication: Secondary | ICD-10-CM

## 2019-11-10 DIAGNOSIS — E7800 Pure hypercholesterolemia, unspecified: Secondary | ICD-10-CM | POA: Diagnosis not present

## 2019-11-10 DIAGNOSIS — E049 Nontoxic goiter, unspecified: Secondary | ICD-10-CM

## 2019-11-10 LAB — HEMOGLOBIN A1C
Hgb A1c MFr Bld: 8.8 % of total Hgb — ABNORMAL HIGH (ref <5.7)
Mean Plasma Glucose: 206 (calc)
eAG (mmol/L): 11.4 (calc)

## 2019-11-10 NOTE — Progress Notes (Signed)
   Subjective:    Patient ID: Nicolas Chandler, male    DOB: 19-Jun-1965, 54 y.o.   MRN: [id number]  HPI 54 year old [place] Male seen for follow up on diabetes. Hx Stage 3a chronic kidney disease. This developed after episode of hypotension with DKA and Klebsiella sepsis in [place] in 2018. He had a retained biliary stent and a liver abscess as well as pancreatitis.  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  Says he is playing golf for exercise. Some trouble affording diabetic med in the past according to Dr. Remus Blake notes.   Patient is adamant about not wanting to be on insulin. He is not overweight.BMI 23.18 and weight is 148 pounds.  Unable to tolerate ACE due to hyperkalemia and increased serum creatinine. Weight was 149 pounds in March 2021.  He is supposed to be taking Crestor 5 mg 3 times a week.  In November 2020 total cholesterol was 216 with an LDL cholesterol of 134 and had been 248 in June with an LDL of 157.  He also is on Amaryl 4 mg 2 tablets daily every morning and Metformin 1000 mg twice daily with food.  He used to take Actos 30 mg daily he has an order that should be expiring in July.  Amaryl was refilled in May.  I have given him samples of Januvia 100 mg daily.  Would like for him to discontinue Actos.  He will follow-up in 6 weeks.  Also gave him information on the Januvia assistance program.  He needs to fill out 5 or can bring it back here for me to sign the mail.  When he returns, he will be getting the lipid panel, TSH, free T4, hemoglobin A1c.    Review of Systems no new complaints     Objective:   Physical Exam  Diabetic foot exam is within normal limits.  He is in no acute distress.  Hemoglobin A1c elevated at 8.8% and was previously 8.9% in March and 8.3% in November 2020.  His best hemoglobin A1c was 7% in June 2020. Blood pressure 120/88, pulse 82, pulse oximetry 98% weight 148 pounds BMI 23.18.     Assessment & Plan:   Type 2 diabetes mellitus-would like to see control better than 8.8%.  Start Januvia 100 mg daily.  Samples provided for 6 weeks.  Return in 6 weeks for office visit hemoglobin A1c, lipid panel, liver functions if taking Crestor, free T4 and TSH.  Is adamant about not being on insulin.  History of goiter-check free T4 and TSH upon return  Hyperlipidemia-should be on Crestor 5 mg 3 times a week.  Plan: Follow-up in 6 weeks.D/C Amaryl

## 2019-11-10 NOTE — Patient Instructions (Signed)
D/C Amaryl. Take Januvia 100 mg daily Continue Actos.. Follow up in 6 weeks with labs including lipid, liver if taking Crestor, AIC, free T4 and TSH.

## 2019-11-13 ENCOUNTER — Other Ambulatory Visit: Payer: Self-pay | Admitting: Internal Medicine

## 2019-11-14 DIAGNOSIS — L218 Other seborrheic dermatitis: Secondary | ICD-10-CM | POA: Diagnosis not present

## 2019-11-14 DIAGNOSIS — B078 Other viral warts: Secondary | ICD-10-CM | POA: Diagnosis not present

## 2019-11-14 DIAGNOSIS — L538 Other specified erythematous conditions: Secondary | ICD-10-CM | POA: Diagnosis not present

## 2019-11-30 ENCOUNTER — Other Ambulatory Visit: Payer: Self-pay | Admitting: Internal Medicine

## 2019-12-01 ENCOUNTER — Other Ambulatory Visit: Payer: Self-pay | Admitting: Internal Medicine

## 2019-12-22 ENCOUNTER — Other Ambulatory Visit: Payer: Self-pay

## 2019-12-22 ENCOUNTER — Other Ambulatory Visit: Payer: BC Managed Care – PPO | Admitting: Internal Medicine

## 2019-12-22 DIAGNOSIS — E049 Nontoxic goiter, unspecified: Secondary | ICD-10-CM

## 2019-12-22 DIAGNOSIS — E119 Type 2 diabetes mellitus without complications: Secondary | ICD-10-CM

## 2019-12-22 DIAGNOSIS — E785 Hyperlipidemia, unspecified: Secondary | ICD-10-CM | POA: Diagnosis not present

## 2019-12-23 LAB — HEPATIC FUNCTION PANEL
AG Ratio: 1.8 (calc) (ref 1.0–2.5)
ALT: 15 U/L (ref 9–46)
AST: 17 U/L (ref 10–35)
Albumin: 4.8 g/dL (ref 3.6–5.1)
Alkaline phosphatase (APISO): 78 U/L (ref 35–144)
Bilirubin, Direct: 0.1 mg/dL (ref 0.0–0.2)
Globulin: 2.6 g/dL (calc) (ref 1.9–3.7)
Indirect Bilirubin: 0.5 mg/dL (calc) (ref 0.2–1.2)
Total Bilirubin: 0.6 mg/dL (ref 0.2–1.2)
Total Protein: 7.4 g/dL (ref 6.1–8.1)

## 2019-12-23 LAB — LIPID PANEL
Cholesterol: 170 mg/dL (ref <200)
HDL: 51 mg/dL (ref >=40)
LDL Cholesterol (Calc): 98 mg/dL (calc)
Non-HDL Cholesterol (Calc): 119 mg/dL (calc) (ref <130)
Total CHOL/HDL Ratio: 3.3 (calc) (ref <5.0)
Triglycerides: 112 mg/dL (ref <150)

## 2019-12-23 LAB — TSH: TSH: 0.35 mIU/L — ABNORMAL LOW (ref 0.40–4.50)

## 2019-12-23 LAB — HEMOGLOBIN A1C
Hgb A1c MFr Bld: 7.5 % of total Hgb — ABNORMAL HIGH (ref <5.7)
Mean Plasma Glucose: 169 (calc)
eAG (mmol/L): 9.3 (calc)

## 2019-12-23 LAB — T4, FREE: Free T4: 1.5 ng/dL (ref 0.8–1.8)

## 2020-01-04 ENCOUNTER — Other Ambulatory Visit: Payer: Self-pay | Admitting: Internal Medicine

## 2020-02-09 DIAGNOSIS — N2581 Secondary hyperparathyroidism of renal origin: Secondary | ICD-10-CM | POA: Diagnosis not present

## 2020-02-09 DIAGNOSIS — I129 Hypertensive chronic kidney disease with stage 1 through stage 4 chronic kidney disease, or unspecified chronic kidney disease: Secondary | ICD-10-CM | POA: Diagnosis not present

## 2020-02-09 DIAGNOSIS — E1122 Type 2 diabetes mellitus with diabetic chronic kidney disease: Secondary | ICD-10-CM | POA: Diagnosis not present

## 2020-02-09 DIAGNOSIS — N189 Chronic kidney disease, unspecified: Secondary | ICD-10-CM | POA: Diagnosis not present

## 2020-02-09 DIAGNOSIS — N183 Chronic kidney disease, stage 3 unspecified: Secondary | ICD-10-CM | POA: Diagnosis not present

## 2020-02-09 DIAGNOSIS — D631 Anemia in chronic kidney disease: Secondary | ICD-10-CM | POA: Diagnosis not present

## 2020-02-22 ENCOUNTER — Other Ambulatory Visit: Payer: Self-pay | Admitting: Internal Medicine

## 2020-02-23 DIAGNOSIS — N183 Chronic kidney disease, stage 3 unspecified: Secondary | ICD-10-CM | POA: Diagnosis not present

## 2020-02-27 ENCOUNTER — Other Ambulatory Visit: Payer: Self-pay | Admitting: Nephrology

## 2020-02-27 ENCOUNTER — Other Ambulatory Visit: Payer: Self-pay | Admitting: Internal Medicine

## 2020-02-27 DIAGNOSIS — N2581 Secondary hyperparathyroidism of renal origin: Secondary | ICD-10-CM

## 2020-02-27 DIAGNOSIS — E1322 Other specified diabetes mellitus with diabetic chronic kidney disease: Secondary | ICD-10-CM

## 2020-02-27 DIAGNOSIS — N183 Chronic kidney disease, stage 3 unspecified: Secondary | ICD-10-CM

## 2020-02-27 DIAGNOSIS — I129 Hypertensive chronic kidney disease with stage 1 through stage 4 chronic kidney disease, or unspecified chronic kidney disease: Secondary | ICD-10-CM

## 2020-03-04 ENCOUNTER — Ambulatory Visit
Admission: RE | Admit: 2020-03-04 | Discharge: 2020-03-04 | Disposition: A | Discharge status: Home / Self Care | Payer: BC Managed Care – PPO | Source: Ambulatory Visit | Attending: Nephrology | Admitting: Nephrology

## 2020-03-04 DIAGNOSIS — N2581 Secondary hyperparathyroidism of renal origin: Secondary | ICD-10-CM

## 2020-03-04 DIAGNOSIS — I129 Hypertensive chronic kidney disease with stage 1 through stage 4 chronic kidney disease, or unspecified chronic kidney disease: Secondary | ICD-10-CM

## 2020-03-04 DIAGNOSIS — E1322 Other specified diabetes mellitus with diabetic chronic kidney disease: Secondary | ICD-10-CM

## 2020-03-04 DIAGNOSIS — N183 Chronic kidney disease, stage 3 unspecified: Secondary | ICD-10-CM

## 2020-03-04 DIAGNOSIS — N189 Chronic kidney disease, unspecified: Secondary | ICD-10-CM | POA: Diagnosis not present

## 2020-03-20 DIAGNOSIS — N2581 Secondary hyperparathyroidism of renal origin: Secondary | ICD-10-CM | POA: Diagnosis not present

## 2020-03-20 DIAGNOSIS — N183 Chronic kidney disease, stage 3 unspecified: Secondary | ICD-10-CM | POA: Diagnosis not present

## 2020-03-20 DIAGNOSIS — I129 Hypertensive chronic kidney disease with stage 1 through stage 4 chronic kidney disease, or unspecified chronic kidney disease: Secondary | ICD-10-CM | POA: Diagnosis not present

## 2020-03-20 DIAGNOSIS — D631 Anemia in chronic kidney disease: Secondary | ICD-10-CM | POA: Diagnosis not present

## 2020-03-27 DIAGNOSIS — E875 Hyperkalemia: Secondary | ICD-10-CM | POA: Diagnosis not present

## 2020-04-05 DIAGNOSIS — E875 Hyperkalemia: Secondary | ICD-10-CM | POA: Diagnosis not present

## 2020-04-07 ENCOUNTER — Other Ambulatory Visit: Payer: Self-pay | Admitting: Internal Medicine

## 2020-05-24 ENCOUNTER — Other Ambulatory Visit: Payer: Self-pay | Admitting: Internal Medicine

## 2020-05-25 ENCOUNTER — Other Ambulatory Visit: Payer: Self-pay | Admitting: Internal Medicine

## 2020-06-12 DIAGNOSIS — I129 Hypertensive chronic kidney disease with stage 1 through stage 4 chronic kidney disease, or unspecified chronic kidney disease: Secondary | ICD-10-CM | POA: Diagnosis not present

## 2020-06-12 DIAGNOSIS — K750 Abscess of liver: Secondary | ICD-10-CM | POA: Diagnosis not present

## 2020-06-12 DIAGNOSIS — D631 Anemia in chronic kidney disease: Secondary | ICD-10-CM | POA: Diagnosis not present

## 2020-06-12 DIAGNOSIS — N183 Chronic kidney disease, stage 3 unspecified: Secondary | ICD-10-CM | POA: Diagnosis not present

## 2020-06-12 DIAGNOSIS — N2581 Secondary hyperparathyroidism of renal origin: Secondary | ICD-10-CM | POA: Diagnosis not present

## 2020-06-13 DIAGNOSIS — N183 Chronic kidney disease, stage 3 unspecified: Secondary | ICD-10-CM | POA: Diagnosis not present

## 2020-06-19 DIAGNOSIS — Z029 Encounter for administrative examinations, unspecified: Secondary | ICD-10-CM

## 2020-06-21 DIAGNOSIS — N183 Chronic kidney disease, stage 3 unspecified: Secondary | ICD-10-CM | POA: Diagnosis not present

## 2020-06-27 ENCOUNTER — Other Ambulatory Visit: Payer: Self-pay | Admitting: Internal Medicine

## 2020-06-27 NOTE — Telephone Encounter (Signed)
Past due for CPE. Please fill for 30 days and call him

## 2020-07-19 ENCOUNTER — Other Ambulatory Visit: Payer: BC Managed Care – PPO | Admitting: Internal Medicine

## 2020-07-23 ENCOUNTER — Encounter: Payer: BC Managed Care – PPO | Admitting: Internal Medicine

## 2020-07-31 DIAGNOSIS — N183 Chronic kidney disease, stage 3 unspecified: Secondary | ICD-10-CM | POA: Diagnosis not present

## 2020-08-19 ENCOUNTER — Other Ambulatory Visit: Payer: Self-pay | Admitting: Internal Medicine

## 2020-08-30 DIAGNOSIS — E1165 Type 2 diabetes mellitus with hyperglycemia: Secondary | ICD-10-CM | POA: Diagnosis not present

## 2020-08-30 DIAGNOSIS — R350 Frequency of micturition: Secondary | ICD-10-CM | POA: Diagnosis not present

## 2020-08-30 DIAGNOSIS — N200 Calculus of kidney: Secondary | ICD-10-CM | POA: Diagnosis not present

## 2020-08-30 DIAGNOSIS — I10 Essential (primary) hypertension: Secondary | ICD-10-CM | POA: Diagnosis not present

## 2020-08-30 DIAGNOSIS — R109 Unspecified abdominal pain: Secondary | ICD-10-CM | POA: Diagnosis not present

## 2020-09-05 ENCOUNTER — Encounter: Payer: BC Managed Care – PPO | Admitting: Internal Medicine

## 2020-09-09 ENCOUNTER — Other Ambulatory Visit: Payer: Self-pay

## 2020-09-27 DIAGNOSIS — I129 Hypertensive chronic kidney disease with stage 1 through stage 4 chronic kidney disease, or unspecified chronic kidney disease: Secondary | ICD-10-CM | POA: Diagnosis not present

## 2020-09-27 DIAGNOSIS — K750 Abscess of liver: Secondary | ICD-10-CM | POA: Diagnosis not present

## 2020-09-27 DIAGNOSIS — D631 Anemia in chronic kidney disease: Secondary | ICD-10-CM | POA: Diagnosis not present

## 2020-09-27 DIAGNOSIS — I10 Essential (primary) hypertension: Secondary | ICD-10-CM | POA: Diagnosis not present

## 2020-09-27 DIAGNOSIS — N2581 Secondary hyperparathyroidism of renal origin: Secondary | ICD-10-CM | POA: Diagnosis not present

## 2020-09-27 DIAGNOSIS — E1165 Type 2 diabetes mellitus with hyperglycemia: Secondary | ICD-10-CM | POA: Diagnosis not present

## 2020-09-27 DIAGNOSIS — N183 Chronic kidney disease, stage 3 unspecified: Secondary | ICD-10-CM | POA: Diagnosis not present

## 2020-09-27 DIAGNOSIS — E785 Hyperlipidemia, unspecified: Secondary | ICD-10-CM | POA: Diagnosis not present

## 2020-10-29 DIAGNOSIS — N183 Chronic kidney disease, stage 3 unspecified: Secondary | ICD-10-CM | POA: Diagnosis not present

## 2020-12-27 DIAGNOSIS — N2581 Secondary hyperparathyroidism of renal origin: Secondary | ICD-10-CM | POA: Diagnosis not present

## 2020-12-27 DIAGNOSIS — N183 Chronic kidney disease, stage 3 unspecified: Secondary | ICD-10-CM | POA: Diagnosis not present

## 2020-12-27 DIAGNOSIS — K750 Abscess of liver: Secondary | ICD-10-CM | POA: Diagnosis not present

## 2020-12-27 DIAGNOSIS — D631 Anemia in chronic kidney disease: Secondary | ICD-10-CM | POA: Diagnosis not present

## 2020-12-27 DIAGNOSIS — I129 Hypertensive chronic kidney disease with stage 1 through stage 4 chronic kidney disease, or unspecified chronic kidney disease: Secondary | ICD-10-CM | POA: Diagnosis not present

## 2021-02-04 DIAGNOSIS — N183 Chronic kidney disease, stage 3 unspecified: Secondary | ICD-10-CM | POA: Diagnosis not present

## 2021-03-14 ENCOUNTER — Encounter: Payer: Self-pay | Admitting: Internal Medicine

## 2021-03-26 DIAGNOSIS — Z6822 Body mass index (BMI) 22.0-22.9, adult: Secondary | ICD-10-CM | POA: Diagnosis not present

## 2021-03-26 DIAGNOSIS — I10 Essential (primary) hypertension: Secondary | ICD-10-CM | POA: Diagnosis not present

## 2021-03-26 DIAGNOSIS — E1165 Type 2 diabetes mellitus with hyperglycemia: Secondary | ICD-10-CM | POA: Diagnosis not present

## 2021-03-26 DIAGNOSIS — E785 Hyperlipidemia, unspecified: Secondary | ICD-10-CM | POA: Diagnosis not present

## 2021-03-26 DIAGNOSIS — R351 Nocturia: Secondary | ICD-10-CM | POA: Diagnosis not present

## 2021-03-26 DIAGNOSIS — Z0000 Encounter for general adult medical examination without abnormal findings: Secondary | ICD-10-CM | POA: Diagnosis not present

## 2021-03-26 DIAGNOSIS — L858 Other specified epidermal thickening: Secondary | ICD-10-CM | POA: Diagnosis not present

## 2021-03-26 DIAGNOSIS — Z1211 Encounter for screening for malignant neoplasm of colon: Secondary | ICD-10-CM | POA: Diagnosis not present

## 2021-03-27 DIAGNOSIS — N183 Chronic kidney disease, stage 3 unspecified: Secondary | ICD-10-CM | POA: Diagnosis not present

## 2021-03-27 DIAGNOSIS — I129 Hypertensive chronic kidney disease with stage 1 through stage 4 chronic kidney disease, or unspecified chronic kidney disease: Secondary | ICD-10-CM | POA: Diagnosis not present

## 2021-03-27 DIAGNOSIS — D631 Anemia in chronic kidney disease: Secondary | ICD-10-CM | POA: Diagnosis not present

## 2021-03-27 DIAGNOSIS — N2581 Secondary hyperparathyroidism of renal origin: Secondary | ICD-10-CM | POA: Diagnosis not present
